# Patient Record
Sex: Male | Born: 1990 | Race: Black or African American | Hispanic: No | Marital: Single | Smoking: Current every day smoker
Health system: Southern US, Community
[De-identification: ages and names within clinical notes are randomized; demographics above are authoritative.]

## PROBLEM LIST (undated history)

## (undated) DIAGNOSIS — F329 Major depressive disorder, single episode, unspecified: Secondary | ICD-10-CM

## (undated) DIAGNOSIS — Z720 Tobacco use: Secondary | ICD-10-CM

## (undated) DIAGNOSIS — F1010 Alcohol abuse, uncomplicated: Secondary | ICD-10-CM

## (undated) DIAGNOSIS — L309 Dermatitis, unspecified: Secondary | ICD-10-CM

## (undated) DIAGNOSIS — F10939 Alcohol use, unspecified with withdrawal, unspecified: Secondary | ICD-10-CM

## (undated) DIAGNOSIS — F1020 Alcohol dependence, uncomplicated: Secondary | ICD-10-CM

## (undated) DIAGNOSIS — I5020 Unspecified systolic (congestive) heart failure: Secondary | ICD-10-CM

---
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
## 2011-05-01 ENCOUNTER — Emergency Department (HOSPITAL_COMMUNITY)
Admission: EM | Admit: 2011-05-01 | Discharge: 2011-05-01 | Disposition: A | Discharge status: Home / Self Care | Payer: PRIVATE HEALTH INSURANCE | Source: Home / Self Care | Attending: Family Medicine | Admitting: Family Medicine

## 2011-05-01 DIAGNOSIS — F419 Anxiety disorder, unspecified: Secondary | ICD-10-CM

## 2011-05-01 DIAGNOSIS — F411 Generalized anxiety disorder: Secondary | ICD-10-CM

## 2011-05-01 MED ORDER — LORAZEPAM 0.5 MG PO TABS: 0.5000 mg | ORAL_TABLET | Freq: Three times a day (TID) | ORAL | Status: AC | PRN

## 2011-05-01 NOTE — ED Notes (Signed)
C/o he is anxious all the time, since about 1 yr or so ago; NAD at presenr

## 2011-05-01 NOTE — ED Provider Notes (Signed)
History     CSN: [id number] Arrival date & time: 05/01/2011 11:14 AM   First MD Initiated Contact with Patient 05/01/11 1050      Chief Complaint  Patient presents with  . Anxiety    (Consider location/radiation/quality/duration/timing/severity/associated sxs/prior treatment) Patient is a 20 y.o. male presenting with anxiety. The history is provided by the patient.  Anxiety This is a chronic problem. Episode onset: ONSGOING FOR ABOUT A YEAR. The problem occurs constantly. The problem has not changed since onset.Pertinent negatives include no headaches. Associated symptoms comments: NONE.  STATES HE IS NOT SLEEPING, HAS LOST WEIGHT. STATES LIVING AND SCHOOL HAVE HIM STRESSED OUT. NO SUICIDAL OR HOMICIDAL IDEATION. STATES HE WANTS TO GO TO MENTAL HEALTH BUT DID NOT KNOW HOW TO GO ABOUT IT. HE IS HEAR WITH HIS FATHER. DENIES USE OF ENERGY DRINKS, CAFFEINE, OR NICOTINE.   History reviewed. No pertinent past medical history.  History reviewed. No pertinent past surgical history.  History reviewed. No pertinent family history.  History  Substance Use Topics  . Smoking status: Not on file  . Smokeless tobacco: Not on file  . Alcohol Use: Yes      Review of Systems  Constitutional: Positive for unexpected weight change.  HENT: Negative.   Eyes: Negative.   Respiratory: Negative.   Cardiovascular: Negative.   Gastrointestinal: Negative.   Genitourinary: Negative.   Musculoskeletal: Negative.   Neurological: Negative for headaches.  Psychiatric/Behavioral: Positive for sleep disturbance. The patient is nervous/anxious.     Allergies  Review of patient's allergies indicates no known allergies.  Home Medications   Current Outpatient Rx  Name Route Sig Dispense Refill  . LORAZEPAM 0.5 MG PO TABS Oral Take 1 tablet (0.5 mg total) by mouth every 8 (eight) hours as needed for anxiety. 15 tablet 0    BP 107/83  Pulse 75  Temp(Src) 98.1 F (36.7 C) (Oral)  Resp 16  SpO2  100%  Physical Exam  Constitutional: He is oriented to person, place, and time. He appears well-developed and well-nourished. No distress.  HENT:  Head: Normocephalic and atraumatic.  Right Ear: External ear normal.  Left Ear: External ear normal.  Nose: Nose normal.  Mouth/Throat: Oropharynx is clear and moist.  Eyes: Pupils are equal, round, and reactive to light.  Neck: Normal range of motion. Neck supple. No tracheal deviation present. No thyromegaly present.  Cardiovascular: Normal rate, regular rhythm and normal heart sounds.   Pulmonary/Chest: Effort normal and breath sounds normal.  Abdominal: Soft. Bowel sounds are normal.  Lymphadenopathy:    He has no cervical adenopathy.  Neurological: He is alert and oriented to person, place, and time. He has normal reflexes.  Skin: Skin is warm and dry.  Psychiatric: He has a normal mood and affect. His behavior is normal. Judgment and thought content normal.    ED Course  Procedures (including critical care time)  Labs Reviewed - No data to display No results found.   1. Anxiety       MDM          Randa Spike, MD 05/01/11 [id number]

## 2011-09-09 ENCOUNTER — Encounter (HOSPITAL_COMMUNITY): Payer: Self-pay | Admitting: *Deleted

## 2011-09-09 ENCOUNTER — Emergency Department (INDEPENDENT_AMBULATORY_CARE_PROVIDER_SITE_OTHER)
Admission: EM | Admit: 2011-09-09 | Discharge: 2011-09-09 | Disposition: A | Discharge status: Home / Self Care | Payer: PRIVATE HEALTH INSURANCE | Source: Home / Self Care | Attending: Emergency Medicine | Admitting: Emergency Medicine

## 2011-09-09 DIAGNOSIS — R51 Headache: Secondary | ICD-10-CM

## 2011-09-09 DIAGNOSIS — F438 Other reactions to severe stress: Secondary | ICD-10-CM

## 2011-09-09 HISTORY — DX: Dermatitis, unspecified: L30.9

## 2011-09-09 LAB — POCT URINALYSIS DIP (DEVICE)
Bilirubin Urine: NEGATIVE
Protein, ur: NEGATIVE mg/dL
pH: 6.5 (ref 5.0–8.0)

## 2011-09-09 NOTE — ED Notes (Signed)
Pt college student with c/o decreased appetite/weight loss/constipation x approx one year  Per pt has lost 15 lbs since fall of 2011 - start of college - pt had headache this morning which has resolved

## 2011-09-09 NOTE — ED Provider Notes (Signed)
Medical screening examination/treatment/procedure(s) were performed by non-physician practitioner and as supervising physician I was immediately available for consultation/collaboration.Have examined and interviewed patient with NP student.  Raynald Blend, MD 09/09/11 2142

## 2011-09-09 NOTE — Discharge Instructions (Signed)
   Has discuss have recommended that Bradley Cummings colds or behavioral clinic for a intake visit or interview to further evaluate his current symptoms. We cannot continue prescribing Adderall without a formal diagnosis. Please call this provider number to schedule a followup appointment should also call second provided number to established Bradley Cummings to a with a primary care Dr.    Denton Meek Headache, Without Cause A general headache has no specific cause. These headaches are not life-threatening. They will not lead to other types of headaches. HOME CARE   Make and keep follow-up visits with your doctor.   Only take medicine as told by your doctor.   Try to relax, get a massage, or use your thoughts to control your body (biofeedback).   Apply cold or heat to the head and neck. Apply 3 or 4 times a day or as needed.  Finding out the results of your test Ask when your test results will be ready. Make sure you get your test results. GET HELP RIGHT AWAY IF:   You have problems with medicine.   Your medicine does not help relieve pain.   Your headache changes or becomes worse.   You feel sick to your stomach (nauseous) or throw up (vomit).   You have a temperature by mouth above 102 F (38.9 C), not controlled by medicine.   Your have a stiff neck.   You have vision loss.   You have muscle weakness.   You lose control of your muscles.   You lose balance or have trouble walking.   You feel like you are going to pass out (faint).  MAKE SURE YOU:   Understand these instructions.   Will watch this condition.   Will get help right away if you are not doing well or get worse.  Document Released: 02/25/2008 Document Revised: 05/07/2011 Document Reviewed: 02/25/2008 [facility] Patient Information 2012 [place], [place].General Headache, Without Cause A general headache has no specific cause. These headaches are not life-threatening. They will not lead to other types of headaches. HOME CARE    Make and keep follow-up visits with your doctor.   Only take medicine as told by your doctor.   Try to relax, get a massage, or use your thoughts to control your body (biofeedback).   Apply cold or heat to the head and neck. Apply 3 or 4 times a day or as needed.  Finding out the results of your test Ask when your test results will be ready. Make sure you get your test results. GET HELP RIGHT AWAY IF:   You have problems with medicine.   Your medicine does not help relieve pain.   Your headache changes or becomes worse.   You feel sick to your stomach (nauseous) or throw up (vomit).   You have a temperature by mouth above 102 F (38.9 C), not controlled by medicine.   Your have a stiff neck.   You have vision loss.   You have muscle weakness.   You lose control of your muscles.   You lose balance or have trouble walking.   You feel like you are going to pass out (faint).  MAKE SURE YOU:   Understand these instructions.   Will watch this condition.   Will get help right away if you are not doing well or get worse.  Document Released: 02/25/2008 Document Revised: 05/07/2011 Document Reviewed: 02/25/2008 [facility] Patient Information 2012 [place], [place].

## 2011-09-09 NOTE — ED Provider Notes (Signed)
History     CSN: [id number]  Arrival date & time 09/09/11  4098   First MD Initiated Contact with Patient 09/09/11 1139      Chief Complaint  Patient presents with  . Constipation  . Headache    (Consider location/radiation/quality/duration/timing/severity/associated sxs/prior treatment) HPI Comments: Had a string headache yesterday, he has been having problems sleeping and eating since going to colleague. Not eating well and  loosing weight, not  Patient is a 21 y.o. male presenting with constipation and headaches. The history is provided by the patient.  Constipation  The problem occurs frequently. The problem has been resolved. The patient is experiencing no pain. Prior successful therapies include stool softeners and fiber. Associated symptoms include headaches. Pertinent negatives include no fever, no rectal pain, no vomiting and no rash.  Headache The primary symptoms include headaches. Primary symptoms do not include dizziness, fever or vomiting.  The headache is not associated with weakness.  Additional symptoms do not include weakness.    Past Medical History  Diagnosis Date  . Eczema     History reviewed. No pertinent past surgical history.  History reviewed. No pertinent family history.  History  Substance Use Topics  . Smoking status: Never Smoker   . Smokeless tobacco: Not on file  . Alcohol Use: Yes      Review of Systems  Constitutional: Positive for appetite change, fatigue and unexpected weight change. Negative for fever.  HENT: Negative for ear pain.   Gastrointestinal: Positive for constipation. Negative for vomiting and rectal pain.  Musculoskeletal: Negative.   Skin: Negative.  Negative for rash.  Neurological: Positive for headaches. Negative for dizziness, facial asymmetry, speech difficulty, weakness and numbness.    Allergies  Review of patient's allergies indicates no known allergies.  Home Medications  No current outpatient  prescriptions on file.  BP 115/79  Pulse 60  Temp(Src) 98.5 F (36.9 C) (Oral)  Resp 18  SpO2 100%  Physical Exam  Nursing note and vitals reviewed. Constitutional: He appears well-developed and well-nourished.  HENT:  Head: Normocephalic.  Right Ear: Tympanic membrane normal.  Left Ear: Tympanic membrane normal.  Mouth/Throat: Uvula is midline and oropharynx is clear and moist. No oropharyngeal exudate.  Eyes: Conjunctivae are normal.  Neck: Neck supple.  Cardiovascular: Normal rate.   Pulmonary/Chest: Effort normal and breath sounds normal. He has no decreased breath sounds.  Skin: No rash noted.    ED Course  Procedures (including critical care time)   Labs Reviewed  POCT URINALYSIS DIP (DEVICE)   No results found.   1. Generalized headaches   2. Other acute reactions to stress       MDM  Recurrent HAs patient feels he is stressed out at colleague, and not ding well, feels very anxious about it, wants to take some more adder al. Davina Poke parent  estblish hi with a primary care doctor and to a behavior health provider        Jimmie Molly, MD 09/09/11 2141

## 2011-09-09 NOTE — ED Provider Notes (Signed)
History     CSN: [id number]  Arrival date & time 09/09/11  4098   First MD Initiated Contact with Patient 09/09/11 1139      Chief Complaint  Patient presents with  . Constipation  . Headache    (Consider location/radiation/quality/duration/timing/severity/associated sxs/prior treatment) The history is provided by the patient and a parent. No language interpreter was used.    Past Medical History  Diagnosis Date  . Eczema     History reviewed. No pertinent past surgical history.  History reviewed. No pertinent family history.  History  Substance Use Topics  . Smoking status: Never Smoker   . Smokeless tobacco: Not on file  . Alcohol Use: Yes      Review of Systems  Constitutional: Positive for appetite change and unexpected weight change. Negative for fever, chills, diaphoresis, activity change and fatigue.       Reports appetite changes, "dont feel like eating"  Has lost approximately "15 lbs since 2011 when starting college"  Reports overall  "tightness feeling" to the body   Respiratory: Negative for apnea, cough, choking, shortness of breath and wheezing.   Cardiovascular: Negative for palpitations.  Gastrointestinal: Positive for constipation. Negative for nausea, vomiting, diarrhea, blood in stool and abdominal distention.       Abdominal "tightness", hx of constipation stools " once weekly"  Genitourinary: Negative for dysuria, hematuria, flank pain, discharge and difficulty urinating.       Reports hx of "pelvic pain /tightness"  Skin: Negative for rash.  Neurological: Negative for dizziness.  Psychiatric/Behavioral: Negative for confusion.       Reports he feels "stressed" since he has been "starting college in 2011"    Allergies  Review of patient's allergies indicates no known allergies.  Home Medications  No current outpatient prescriptions on file.  BP 115/79  Pulse 60  Temp(Src) 98.5 F (36.9 C) (Oral)  Resp 18  SpO2 100%  Physical Exam    Constitutional: He is oriented to person, place, and time. He appears well-developed and well-nourished. No distress.  Cardiovascular: Normal rate.   No murmur heard. Pulmonary/Chest: Effort normal. No respiratory distress. He has no wheezes.  Abdominal: He exhibits no distension. There is no tenderness. There is no rebound and no guarding.  Neurological: He is alert and oriented to person, place, and time.  Skin: Skin is warm and dry. No rash noted. No erythema.  Psychiatric:       Cooperative, report marijuana uses, etoh socially,  Denies other drug use, reports he had Adderall in December which seem to help him concentrate and help him think    ED Course  Procedures (including critical care time)   Labs Reviewed  POCT URINALYSIS DIP (DEVICE)   No results found.   No diagnosis found.    MDM  21 yo male presents to [facility] today afebrile with c/o loss of appetite, tightness feeling throughout the body, pelvic tightness, 15 lb weight loss since starting college in 2011, was given Adderall in December which helped him be able to think and helped, urinalysis wnl, explained that would send to behavior health for referral for further tx regimen, father and patient agree with tx plan        Lolly Mustache, Student-NP 09/09/11 1253

## 2011-10-27 ENCOUNTER — Ambulatory Visit (HOSPITAL_COMMUNITY): Admission: RE | Admit: 2011-10-27 | Payer: Self-pay | Source: Home / Self Care | Admitting: Psychiatry

## 2011-10-27 NOTE — BH Assessment (Signed)
Assessment Note   Bradley Cummings is an 21 y.o. male who presented to this facility as a walk-in accompanied by both his parents. Parents report that patient has been acting different, unable to eat and reporting hearing people who commend him to "do things I don't like to do". Patient denied SI/HI.  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 Parents report that patient was taken to [facility] urgent Care a couple of days ago, was put on a certain  medication which did not help. Yesterday patient and parents returned to urgent care and were recommended to come to [facility].  Patient came to [facility] voluntarily but was reluctant to be assessed. Assessment was started per parents encouragements. Pt reported that he has been smoking marijuana since 12th grade and has been drinking alcohol occasionally. Patient has not been able to eat for a couple of weeks but can sleep up to 8 hours. Patient has lost more than  5 lbs and appears weak and slim. During assessment, patient was restless, wanting to leave but parents encouraged him to stay. Patient decided to sign any consent and walked away in the middle of the assessment. AC was notified.   Axis I: Psychotic Disorder NOS Axis II: Deferred Axis III:  Past Medical History  Diagnosis Date  . Eczema    Axis IV: other psychosocial or environmental problems and problems related to social environment Axis V: 21-30 behavior considerably influenced by delusions or hallucinations OR serious impairment in judgment, communication OR inability to function in almost all areas  Past Medical History:  Past Medical History  Diagnosis Date  . Eczema     No past surgical history on file.  Family History: No family history on file.  Social History:  reports that he has never smoked. He does not have any smokeless tobacco history on file. He reports that he drinks alcohol. He reports that he does not use illicit drugs.  Additional Social History:    patient admits using marijuana on regular basis, one blunt daily. Last use 5/27. Admits drinking alcohol occasionally; has not had one in 2 weeks.   CIWA:   COWS:    Allergies: No Known Allergies  Home Medications:  No prescriptions prior to admission    OB/GYN Status:  No LMP for male patient.  General Assessment Data Location of Assessment: [facility] Assessment Services Living Arrangements: Parent Can pt return to current living arrangement?: Yes Admission Status: Voluntary Is patient capable of signing voluntary admission?: Yes Transfer from: Home Referral Source: Self/Family/Friend  Education Status Is patient currently in school?: Yes Current Grade: sophomore Highest grade of school patient has completed: junior college Name of school: [employer] person: [place], [place] [phone])  Risk to self Suicidal Ideation: No Suicidal Intent: No Is patient at risk for suicide?: No Suicidal Plan?: No Access to Means: No What has been your use of drugs/alcohol within the last 12 months?: using marijuana Previous Attempts/Gestures: No How many times?: 0  Other Self Harm Risks: na Triggers for Past Attempts: None known Intentional Self Injurious Behavior: None Family Suicide History: No Recent stressful life event(s): Other (Comment) (no one listed) Persecutory voices/beliefs?: Yes Depression: No Substance abuse history and/or treatment for substance abuse?: Yes (alcohol and marijuana) Suicide prevention information given to non-admitted patients: Not applicable  Risk to Others Homicidal Ideation: No Thoughts of Harm to Others: No Current Homicidal Intent: No Current Homicidal Plan: No Access to Homicidal Means: No Identified Victim: na History  of harm to others?: No Assessment of Violence: None Noted Violent Behavior Description: na Does patient have access to weapons?: No Criminal Charges Pending?: No Does patient have a court date:  No  Psychosis Hallucinations: Auditory ("hear people pushing me to do things i dont like to do") Delusions: None noted  Mental Status Report Appear/Hygiene: Other (Comment) (age apropriate) Eye Contact: Poor Motor Activity: Restlessness Speech: Incoherent;Pressured Level of Consciousness: Restless;Other (Comment) (weak/tired) Mood: Anxious;Fearful;Irritable;Sad Affect: Anxious;Sad Anxiety Level: Moderate Thought Processes: Tangential Judgement: Impaired Orientation: Person;Place;Situation Obsessive Compulsive Thoughts/Behaviors: Moderate  Cognitive Functioning Concentration: Decreased Memory: Remote Impaired IQ: Average Insight: Poor Impulse Control: Poor Appetite: Poor (dad reports pt has not been eating) Weight Loss: 5  Weight Gain: 0  Sleep: No Change Total Hours of Sleep: 8  Vegetative Symptoms: None  ADLScreening [facility] Assessment Services) Patient's cognitive ability adequate to safely complete daily activities?: No Patient able to express need for assistance with ADLs?: Yes Independently performs ADLs?: Yes  Abuse/Neglect [facility]) Physical Abuse: Denies Verbal Abuse: Denies Sexual Abuse: Denies  Prior Inpatient Therapy Prior Inpatient Therapy: No Prior Therapy Dates: na Prior Therapy Facilty/Provider(s): na Reason for Treatment: na  Prior Outpatient Therapy Prior Outpatient Therapy: No Prior Therapy Dates: na Prior Therapy Facilty/Provider(s): na Reason for Treatment: na  ADL Screening (condition at time of admission) Patient's cognitive ability adequate to safely complete daily activities?: No Patient able to express need for assistance with ADLs?: Yes Independently performs ADLs?: Yes       Abuse/Neglect Assessment (Assessment to be complete while patient is alone) Physical Abuse: Denies Verbal Abuse: Denies Sexual Abuse: Denies          Additional Information 1:1 In Past 12 Months?: No CIRT Risk: Yes Elopement Risk: Yes Does patient  have medical clearance?: No     Disposition:  Disposition Disposition of Patient: Other dispositions (refused. Left facility) Other disposition(s): Other (Comment) (pt left the unit refusing further assessment&services)  On Site Evaluation by:   Reviewed with Physician:     Olin Pia 10/27/2011 7:45 AM

## 2011-10-28 ENCOUNTER — Ambulatory Visit (HOSPITAL_COMMUNITY)
Admission: RE | Admit: 2011-10-28 | Discharge: 2011-10-28 | Disposition: A | Discharge status: Home / Self Care | Payer: PRIVATE HEALTH INSURANCE | Attending: Psychiatry | Admitting: Psychiatry

## 2011-10-28 DIAGNOSIS — F1010 Alcohol abuse, uncomplicated: Secondary | ICD-10-CM | POA: Insufficient documentation

## 2011-10-28 DIAGNOSIS — F411 Generalized anxiety disorder: Secondary | ICD-10-CM | POA: Insufficient documentation

## 2011-10-28 DIAGNOSIS — Z7289 Other problems related to lifestyle: Secondary | ICD-10-CM | POA: Insufficient documentation

## 2011-10-28 DIAGNOSIS — Z658 Other specified problems related to psychosocial circumstances: Secondary | ICD-10-CM | POA: Insufficient documentation
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
## 2015-12-29 ENCOUNTER — Ambulatory Visit (HOSPITAL_COMMUNITY)
Admission: EM | Admit: 2015-12-29 | Discharge: 2015-12-29 | Disposition: A | Discharge status: Home / Self Care | Payer: Managed Care, Other (non HMO) | Attending: Emergency Medicine | Admitting: Emergency Medicine

## 2015-12-29 ENCOUNTER — Encounter (HOSPITAL_COMMUNITY): Payer: Self-pay | Admitting: *Deleted

## 2015-12-29 DIAGNOSIS — F1010 Alcohol abuse, uncomplicated: Secondary | ICD-10-CM | POA: Diagnosis not present

## 2015-12-29 DIAGNOSIS — K2970 Gastritis, unspecified, without bleeding: Secondary | ICD-10-CM

## 2015-12-29 MED ORDER — PANTOPRAZOLE SODIUM 40 MG PO TBEC: 40.0000 mg | DELAYED_RELEASE_TABLET | Freq: Every day | ORAL | 1 refills | Status: DC

## 2015-12-29 MED ORDER — SUCRALFATE 1 GM/10ML PO SUSP: 1.0000 g | Freq: Three times a day (TID) | ORAL | 1 refills | Status: DC

## 2015-12-29 NOTE — Discharge Instructions (Signed)
Your stomach pain is coming from gastritis. This is caused by the alcohol you drink. Take Protonix daily. Use the Carafate 4 times a day. You need to stop drinking.  If you continue to drink alcohol, your stomach is never going to heal and you will develop more serious health problems. Please look into the resources I have given you for help with quitting alcohol. If you develop vomiting, particularly with blood in it, worsening shakes, or start seeing things that aren't there, please go to the emergency room.

## 2015-12-29 NOTE — ED Triage Notes (Signed)
Patient comes in today complaining of lower abdominal pain, states he just haven't felt like eating this week, states he feels bloated, denies any n/v/d. On exam patient shaking with almost anxiety type behavior, upon further questioning patient drinks 3-4 40oz beers a day for the last 2 years. Patient states he drinks to have a good time but also states that he drinks to stay calm. Patient denies hearing voices or thoughts of hurting himself. Patient also reports that he will have burning sensations over his entire body, hard to get patient to explain this further.

## 2015-12-29 NOTE — ED Provider Notes (Signed)
[facility]    CSN: [id number] Arrival date & time: 12/29/15  1954  First Provider Contact:  First MD Initiated Contact with Patient 12/29/15 2109        History   Chief Complaint Chief Complaint  Patient presents with  . Abdominal Pain  . Shaking  . Anxiety    HPI Bradley Cummings is a 25 y.o. male.   He is a 25 year old man here with his father for evaluation of abdominal pain. He reports over the last month he has had abdominal pain and bloating. Sometimes this is associated with nausea and vomiting. Occasionally he will see blood in the vomit. He has not had any vomiting for the last week or 2. He denies any diarrhea. He states he has bowel movements about once a week. He denies any straining or hard stools.  He drinks 3 40s a day. Last alcohol was 24 hours ago. He is shaky.      Past Medical History:  Diagnosis Date  . Eczema     There are no active problems to display for this patient.   History reviewed. No pertinent surgical history.     Home Medications    Prior to Admission medications   Medication Sig Start Date End Date Taking? Authorizing Provider  pantoprazole (PROTONIX) 40 MG tablet Take 1 tablet (40 mg total) by mouth daily. 12/29/15   Charm Rings, MD  sucralfate (CARAFATE) 1 GM/10ML suspension Take 10 mLs (1 g total) by mouth 4 (four) times daily -  with meals and at bedtime. 12/29/15   Charm Rings, MD    Family History History reviewed. No pertinent family history.  Social History Social History  Substance Use Topics  . Smoking status: Current Every Day Smoker  . Smokeless tobacco: Never Used  . Alcohol use Yes     Comment: 3-4 40oz beers a day     Allergies   Review of patient's allergies indicates no known allergies.   Review of Systems Review of Systems  Constitutional: Negative for fever.  Gastrointestinal: Positive for abdominal distention, abdominal pain, nausea and vomiting. Negative for constipation and diarrhea.      Physical Exam Triage Vital Signs ED Triage Vitals  Enc Vitals Group     BP 12/29/15 2106 142/79     Pulse Rate 12/29/15 2106 100     Resp 12/29/15 2106 19     Temp 12/29/15 2106 98 F (36.7 C)     Temp Source 12/29/15 2106 Oral     SpO2 12/29/15 2106 100 %     Weight --      Height --      Head Circumference --      Peak Flow --      Pain Score 12/29/15 2110 5     Pain Loc --      Pain Edu? --      Excl. in GC? --    No data found.   Updated Vital Signs BP 142/79 (BP Location: Left Arm)   Pulse 100   Temp 98 F (36.7 C) (Oral)   Resp 19   SpO2 100%   Visual Acuity Right Eye Distance:   Left Eye Distance:   Bilateral Distance:    Right Eye Near:   Left Eye Near:    Bilateral Near:     Physical Exam  Constitutional: He appears well-developed and well-nourished. No distress.  Cardiovascular: Normal rate, regular rhythm and normal heart sounds.   No murmur  heard. Pulmonary/Chest: Effort normal and breath sounds normal. He has no wheezes. He has no rales.  Abdominal: Soft. He exhibits no distension. There is tenderness (primarily in epigastric). There is no rebound and no guarding.  Increased bowel sounds     UC Treatments / Results  Labs (all labs ordered are listed, but only abnormal results are displayed) Labs Reviewed - No data to display  EKG  EKG Interpretation None       Radiology No results found.  Procedures Procedures (including critical care time)  Medications Ordered in UC Medications - No data to display   Initial Impression / Assessment and Plan / UC Course  I have reviewed the triage vital signs and the nursing notes.  Pertinent labs & imaging results that were available during my care of the patient were reviewed by me and considered in my medical decision making (see chart for details).  Clinical Course    Pain is likely coming from alcoholic gastritis. Prescription provided for Protonix and Carafate. He is somewhat  shaky and is likely going through alcohol withdrawal. He states he has been through withdrawal before. Denies any history of delirium tremens. We had an extensive discussion about the amount of alcohol he is drinking. Discussed that the medications I'm giving him will help with his pain, but if he continues to drink the stomach will never heal and he will develop more serious health problems.  Provided the resource guide for outpatient substance abuse. His father, who is present for this visit, will work with him to find help for substance abuse. Reviewed reasons to go to the emergency room.  Final Clinical Impressions(s) / UC Diagnoses   Final diagnoses:  Gastritis  Alcohol abuse    New Prescriptions Discharge Medication List as of 12/29/2015  9:27 PM    START taking these medications   Details  pantoprazole (PROTONIX) 40 MG tablet Take 1 tablet (40 mg total) by mouth daily., Starting Sun 12/29/2015, Print    sucralfate (CARAFATE) 1 GM/10ML suspension Take 10 mLs (1 g total) by mouth 4 (four) times daily -  with meals and at bedtime., Starting Sun 12/29/2015, Print         Charm Rings, MD 12/29/15 2134

## 2016-04-02 ENCOUNTER — Ambulatory Visit (HOSPITAL_COMMUNITY)
Admission: RE | Admit: 2016-04-02 | Discharge: 2016-04-02 | Disposition: A | Discharge status: Home / Self Care | Payer: 59 | Source: Home / Self Care | Attending: Psychiatry | Admitting: Psychiatry

## 2016-04-02 ENCOUNTER — Encounter (HOSPITAL_COMMUNITY): Payer: Self-pay | Admitting: Emergency Medicine

## 2016-04-02 ENCOUNTER — Emergency Department (HOSPITAL_COMMUNITY)
Admission: EM | Admit: 2016-04-02 | Discharge: 2016-04-03 | Disposition: A | Discharge status: Other Acute Inpatient Hospital | Payer: Managed Care, Other (non HMO) | Attending: Emergency Medicine | Admitting: Emergency Medicine

## 2016-04-02 DIAGNOSIS — F329 Major depressive disorder, single episode, unspecified: Secondary | ICD-10-CM | POA: Diagnosis present

## 2016-04-02 DIAGNOSIS — F17210 Nicotine dependence, cigarettes, uncomplicated: Secondary | ICD-10-CM | POA: Insufficient documentation

## 2016-04-02 DIAGNOSIS — Z79899 Other long term (current) drug therapy: Secondary | ICD-10-CM | POA: Diagnosis not present

## 2016-04-02 DIAGNOSIS — R441 Visual hallucinations: Secondary | ICD-10-CM

## 2016-04-02 DIAGNOSIS — F1010 Alcohol abuse, uncomplicated: Secondary | ICD-10-CM | POA: Diagnosis not present

## 2016-04-02 LAB — CBC WITH DIFFERENTIAL/PLATELET
BASOS ABS: 0 10*3/uL (ref 0.0–0.1)
Basophils Relative: 0 %
EOS ABS: 0.2 10*3/uL (ref 0.0–0.7)
EOS PCT: 2 %
HCT: 38.7 % — ABNORMAL LOW (ref 39.0–52.0)
Hemoglobin: 12.8 g/dL — ABNORMAL LOW (ref 13.0–17.0)
Lymphocytes Relative: 33 %
Lymphs Abs: 2.3 10*3/uL (ref 0.7–4.0)
MCH: 28.8 pg (ref 26.0–34.0)
MCHC: 33.1 g/dL (ref 30.0–36.0)
MCV: 87.2 fL (ref 78.0–100.0)
Monocytes Absolute: 0.6 10*3/uL (ref 0.1–1.0)
Monocytes Relative: 9 %
Neutro Abs: 4 10*3/uL (ref 1.7–7.7)
Neutrophils Relative %: 56 %
PLATELETS: 236 10*3/uL (ref 150–400)
RBC: 4.44 MIL/uL (ref 4.22–5.81)
RDW: 13 % (ref 11.5–15.5)
WBC: 7.1 10*3/uL (ref 4.0–10.5)

## 2016-04-02 LAB — COMPREHENSIVE METABOLIC PANEL
ALBUMIN: 4.5 g/dL (ref 3.5–5.0)
ALT: 11 U/L — ABNORMAL LOW (ref 17–63)
ANION GAP: 8 (ref 5–15)
AST: 15 U/L (ref 15–41)
Alkaline Phosphatase: 42 U/L (ref 38–126)
BILIRUBIN TOTAL: 0.6 mg/dL (ref 0.3–1.2)
BUN: 11 mg/dL (ref 6–20)
CALCIUM: 9.4 mg/dL (ref 8.9–10.3)
CHLORIDE: 104 mmol/L (ref 101–111)
CO2: 29 mmol/L (ref 22–32)
CREATININE: 0.71 mg/dL (ref 0.61–1.24)
GFR calc Af Amer: >60 mL/min (ref >60)
GFR calc non Af Amer: >60 mL/min (ref >60)
GLUCOSE: 98 mg/dL (ref 65–99)
POTASSIUM: 4 mmol/L (ref 3.5–5.1)
SODIUM: 141 mmol/L (ref 135–145)
Total Protein: 7.4 g/dL (ref 6.5–8.1)

## 2016-04-02 LAB — RAPID URINE DRUG SCREEN, HOSP PERFORMED
Amphetamines: NOT DETECTED
BARBITURATES: NOT DETECTED
Benzodiazepines: NOT DETECTED
COCAINE: NOT DETECTED
OPIATES: NOT DETECTED
Tetrahydrocannabinol: NOT DETECTED

## 2016-04-02 LAB — URINALYSIS, ROUTINE W REFLEX MICROSCOPIC
Bilirubin Urine: NEGATIVE
GLUCOSE, UA: NEGATIVE mg/dL
Hgb urine dipstick: NEGATIVE
Ketones, ur: NEGATIVE mg/dL
LEUKOCYTES UA: NEGATIVE
Nitrite: NEGATIVE
PROTEIN: NEGATIVE mg/dL
Specific Gravity, Urine: 1.028 (ref 1.005–1.030)
pH: 7.5 (ref 5.0–8.0)

## 2016-04-02 LAB — ACETAMINOPHEN LEVEL: Acetaminophen (Tylenol), Serum: <10 ug/mL — ABNORMAL LOW (ref 10–30)

## 2016-04-02 LAB — ETHANOL

## 2016-04-02 LAB — SALICYLATE LEVEL

## 2016-04-02 NOTE — ED Notes (Signed)
Requested pt remove clothing and change into burgandy scrubs...scrubs and yellow hospital socks provided. Pt father at bedside. Pt hesitant to change into scrubs; assured pt personal clothing/belongings will be placed in belongings bag and put in secure location until discharge. Bradley BurtonEmily RN advised. ENM

## 2016-04-02 NOTE — ED Triage Notes (Signed)
25 year old male who now lives at home with his mother and father who stopped drinking 2 months ago.  He reports going through some mild withdrawals but was seen at an urgent care center for a stomach problem and is still taking an unknown medication.  He went tonight to behavioral health and they sent him here to be medically cleared.  Father thinks patient may be depressed or have anxiety.

## 2016-04-02 NOTE — BH Assessment (Signed)
Per Hassie BruceAC Kim, RN [facility]. Irving BurtonEmily, RN has been notified. Support paperwork to be completed by TTS .   Princess BruinsAquicha Duff, MSW, Theresia MajorsLCSWA

## 2016-04-02 NOTE — BH Assessment (Addendum)
Tele Assessment Note   Bradley Cummings is an 25 y.o. male who presents voluntarily accompanied by his father reporting symptoms of anxiety and paranoia. Pt has a history of substance abuse--alcohol (used to drink 1 case/day) and marijuana. Pt reports compliance with stomach medication, but no psychiatric meds.  Pt states that his last drink was 1 week ago, but his dad says that he thought he quit 2 months ago.  Pt admits to seeing things like shadows and dad says he talks to himself and seems confused and unable to focus for the past two months since he quit drinking. Pt admits to withdrawing from his friends and not enjoying playing basketball, etc like he used to.  Pt denies SI HI, or any past attempts. He has had no IP or Op treatment, history of violence.  Pt lives with his parents, and supports include his family. Pt denies history of abuse and trauma. Pt reports there is no family history of mental health px. Pt is not working.  Pt has poor insight and  judgment. Pt's memory is normal. Pt denies legal history. ? MSE: Pt is casually dressed, alert, oriented x4 with pressured speech and restless motor behavior. Eye contact is poor.  Pt's mood anxious and affect is congruent with mood. Thought process is coherent and relevant. There is indication pt could currently be responding to internal stimuli (laughing, smiling to himself), possible delusional thought content. Pt was cooperative throughout assessment.   De BurrsLaurie Park, NP recommends IP treatment. [facility] and TTS will seek inpatient psychiatric treatment.    Diagnosis: Psychotic disorder NOS  Past Medical History:  Past Medical History:  Diagnosis Date  . Eczema     No past surgical history on file.  Family History: No family history on file.  Social History:  reports that he has been smoking.  He has never used smokeless tobacco. He reports that he drinks alcohol. He reports that he does  not use drugs.  Additional Social History:  Alcohol / Drug Use Pain Medications: denies Prescriptions: denies Over the Counter: denies History of alcohol / drug use?: Yes Longest period of sobriety (when/how long): unk Negative Consequences of Use: Personal relationships Withdrawal Symptoms:  (denies currently) Substance #1 Name of Substance 1: alcohol 1 - Age of First Use: 20 1 - Amount (size/oz): used to drink a case a day 1 - Frequency: trying to stop 1 - Duration: 1 week 1 - Last Use / Amount: 1 week ago  CIWA:   COWS:    PATIENT STRENGTHS: (choose at least two) Communication skills Supportive family/friends  Allergies: No Known Allergies  Home Medications:  (Not in a hospital admission)  OB/GYN Status:  No LMP for male patient.  General Assessment Data [facility] T[facility] Assessment: In system Is this a Tele or Face-to-Face Assessment?: Face-to-Face Is this an Initial Assessment or a Re-assessment for this encounter?: Initial Assessment Marital status: Single Living Arrangements: Parent Can pt return to current living arrangement?: Yes Admission Status: Voluntary Is patient capable of signing voluntary admission?: Yes Referral Source: Self/Family/Friend Insurance type: [employer]  Medical Screening Exam [facility] ONLY) Medical Exam completed: Yes  Crisis Care Plan Living Arrangements: Parent Name of Psychiatrist:  (none) Name of Therapist: none  Education Status Is patient currently in school?: No  Risk to self with the past 6 months Suicidal Ideation: No Has patient been a risk to self within the past 6 months  prior to admission? : No Suicidal Intent: No Has patient had any suicidal intent within the past 6 months prior to admission? : No Is patient at risk for suicide?: No Suicidal Plan?: No Has patient had any suicidal plan within the past 6 months prior to admission? : No Access to Means: No What has been your  use of drugs/alcohol within the last 12 months?: denies Previous Attempts/Gestures: No Other Self Harm Risks:  (psychosis) Family Suicide History: No Recent stressful life event(s):  (stopped drinking) Persecutory voices/beliefs?: Yes Depression: Yes Depression Symptoms: Isolating, Loss of interest in usual pleasures, Feeling angry/irritable Substance abuse history and/or treatment for substance abuse?: Yes Suicide prevention information given to non-admitted patients: Not applicable  Risk to Others within the past 6 months Homicidal Ideation: No Does patient have any lifetime risk of violence toward others beyond the six months prior to admission? : No Thoughts of Harm to Others: No Current Homicidal Intent: No Current Homicidal Plan: No Access to Homicidal Means: No History of harm to others?: No Assessment of Violence: None Noted Does patient have access to weapons?: No Criminal Charges Pending?: No Does patient have a court date: No Is patient on probation?: No  Psychosis Hallucinations: Visual, Auditory Delusions: None noted  Mental Status Report Appearance/Hygiene: Unremarkable Eye Contact: Poor Motor Activity: Restlessness Speech: Pressured Level of Consciousness: Alert Mood: Anxious Affect: Anxious, Labile Anxiety Level: Severe Thought Processes: Thought Blocking Judgement: Partial Orientation: Person, Place, Time, Situation, Appropriate for developmental age Obsessive Compulsive Thoughts/Behaviors: None  Cognitive Functioning Concentration: Decreased Memory: Recent Intact, Remote Intact IQ: Average Insight: Poor Impulse Control: Fair Appetite: Fair Weight Loss: 0 Weight Gain: 0 Sleep: No Change Total Hours of Sleep: 8  ADLScreening [facility] Services) Patient's cognitive ability adequate to safely complete daily activities?: Yes Patient able to express need for assistance with ADLs?: Yes Independently performs ADLs?: Yes (appropriate for  developmental age)  Prior Inpatient Therapy Prior Inpatient Therapy: No  Prior Outpatient Therapy Prior Outpatient Therapy: No Does patient have an ACCT team?: No Does patient have Intensive In-House Services?  : No Does patient have [facility] services? : No Does patient have [facility] services?: No  ADL Screening (condition at time of admission) Patient's cognitive ability adequate to safely complete daily activities?: Yes Is the patient deaf or have difficulty hearing?: No Does the patient have difficulty seeing, even when wearing glasses/contacts?: No Does the patient have difficulty concentrating, remembering, or making decisions?: Yes Patient able to express need for assistance with ADLs?: Yes Does the patient have difficulty dressing or bathing?: No Independently performs ADLs?: Yes (appropriate for developmental age) Does the patient have difficulty walking or climbing stairs?: No Weakness of Legs: None Weakness of Arms/Hands: None  Home Assistive Devices/Equipment Home Assistive Devices/Equipment: None    Abuse/Neglect Assessment (Assessment to be complete while patient is alone) Physical Abuse: Denies Verbal Abuse: Denies Sexual Abuse: Denies Exploitation of patient/patient's resources: Denies Self-Neglect: Denies Values / Beliefs Cultural Requests During Hospitalization: None Spiritual Requests During Hospitalization: None   Advance Directives (For Healthcare) Does patient have an advance directive?: No Would patient like information on creating an advanced directive?: No - patient declined information    Additional Information 1:1 In Past 12 Months?: No CIRT Risk: No Elopement Risk: Yes Does patient have medical clearance?: No     Disposition:  Disposition Initial Assessment Completed for this Encounter: Yes Disposition of Patient: Inpatient treatment program  [facility],Laconya Clere Hines 04/02/2016 5:52 PM

## 2016-04-02 NOTE — H&P (Signed)
Behavioral Health Medical Screening Exam  Bradley Cummings is an 25 y.o. male.  Total Time spent with patient: 20 minutes  Psychiatric Specialty Exam: Physical Exam  Constitutional: He is oriented to person, place, and time. He appears well-developed and well-nourished.  HENT:  Head: Normocephalic.  Eyes: Pupils are equal, round, and reactive to light.  Neck: Normal range of motion.  Cardiovascular: Normal rate, regular rhythm and normal heart sounds.   Respiratory: Effort normal and breath sounds normal.  GI: Soft. Bowel sounds are normal.  Musculoskeletal: Normal range of motion.  Neurological: He is alert and oriented to person, place, and time.  Skin: Skin is warm and dry.    Review of Systems  Psychiatric/Behavioral: Positive for hallucinations. Negative for depression, memory loss and suicidal ideas. The patient is not nervous/anxious and does not have insomnia.   All other systems reviewed and are negative.     General Appearance: Casual and Well Groomed  Eye Contact:  Minimal  Speech:  Clear and Coherent and Pressured  Volume:  Normal  Mood:  Dysphoric  Affect:  Constricted and Flat  Thought Process:  Disorganized  Orientation:  Other:  unable to assess, pt is responding to internal stimuli  Thought Content:  Hallucinations: internal stimuli  Suicidal Thoughts:  No  Homicidal Thoughts:  No  Memory:  Immediate;   UTA, internal stimuli Recent;   uta internal stimuli Remote;   uta internal stimuli  Judgement:  Other:  internal stimuli, mostly non verbal  Insight:  internal stimuli  Psychomotor Activity:  Normal  Concentration: Concentration: Fair  Recall:  internal stimuli  Fund of Knowledge:Fair  Language: Fair  Akathisia:  No  Handed:  Right  AIMS (if indicated):     Assets:  Desire for Improvement Financial Resources/Insurance Housing Physical Health Resilience Social Support Transportation  Sleep:       Musculoskeletal: Strength & Muscle Tone: within  normal limits Gait & Station: normal Patient leans: N/A  BP 105/72   Pulse 73   Temp 98.2 F (36.8 C) (Oral)   Resp 18   SpO2 100%   Recommendations:  Based on my evaluation the patient appears to have an emergency medical condition for which I recommend the patient be transferred to the emergency department for further evaluation.  Laveda AbbeLaurie Britton Parks, NP 04/02/2016, 6:08 PM

## 2016-04-02 NOTE — ED Provider Notes (Signed)
[facility]-EMERGENCY DEPT Provider Note   CSN: [id number] Arrival date & time: 04/02/16  1829     History   Chief Complaint Chief Complaint  Patient presents with  . Anxiety    HPI Bradley Cummings is a 25 y.o. male.  25yo M who presents with abnormal behavior. Pt accompanied by father. He was evaluated at behavioral health today due to concerns from father that the patient may be depressed or anxious. Patient was previously a heavy alcohol drinker and his last drink was one week ago. He also reports history of marijuana use. At behavioral health, the patient endorsed hallucinations including "seeing shadows." He denied any SI or HI. [facility]. The patient denies any complaints or recent illness.   The history is provided by the patient and a parent.    Past Medical History:  Diagnosis Date  . Eczema     There are no active problems to display for this patient.   History reviewed. No pertinent surgical history.     Home Medications    Prior to Admission medications   Medication Sig Start Date End Date Taking? Authorizing Provider  sucralfate (CARAFATE) 1 GM/10ML suspension Take 10 mLs (1 g total) by mouth 4 (four) times daily -  with meals and at bedtime. 12/29/15  Yes Charm RingsErin J Honig, MD    Family History No family history on file.  Social History Social History  Substance Use Topics  . Smoking status: Current Every Day Smoker  . Smokeless tobacco: Never Used  . Alcohol use Yes     Comment: 3-4 40oz beers a day     Allergies   Review of patient's allergies indicates no known allergies.   Review of Systems Review of Systems 10 Systems reviewed and are negative for acute change except as noted in the HPI.   Physical Exam Updated Vital Signs BP (!) 120/52 (BP Location: Left Arm)   Pulse 64   Temp 98 F (36.7 C) (Oral)   Resp 16   SpO2 100%   Physical Exam  Constitutional: He is oriented to person, place, and time. He  appears well-developed and well-nourished. No distress.  HENT:  Head: Normocephalic and atraumatic.  Eyes: Conjunctivae are normal.  Neck: Neck supple.  Cardiovascular: Normal rate, regular rhythm and normal heart sounds.   No murmur heard. Pulmonary/Chest: Effort normal and breath sounds normal.  Neurological: He is alert and oriented to person, place, and time.  No tremors  Skin: Skin is warm and dry.  Psychiatric:  Avoids eye contact, pressured and racing speech  Nursing note and vitals reviewed.    ED Treatments / Results  Labs (all labs ordered are listed, but only abnormal results are displayed) Labs Reviewed  COMPREHENSIVE METABOLIC PANEL - Abnormal; Notable for the following:       Result Value   ALT 11 (*)    All other components within normal limits  CBC WITH DIFFERENTIAL/PLATELET - Abnormal; Notable for the following:    Hemoglobin 12.8 (*)    HCT 38.7 (*)    All other components within normal limits  ACETAMINOPHEN LEVEL - Abnormal; Notable for the following:    Acetaminophen (Tylenol), Serum <10 (*)    All other components within normal limits  ETHANOL  SALICYLATE LEVEL  RAPID URINE DRUG SCREEN, HOSP PERFORMED  URINALYSIS, ROUTINE W REFLEX MICROSCOPIC (NOT AT [facility])  PREGNANCY, URINE    EKG  EKG Interpretation None  Radiology No results found.  Procedures Procedures (including critical care time)  Medications Ordered in ED Medications - No data to display   Initial Impression / Assessment and Plan / ED Course  I have reviewed the triage vital signs and the nursing notes.  Pertinent labs  that were available during my care of the patient were reviewed by me and considered in my medical decision making (see chart for details).  Clinical Course   [facility] evaluation for hallucinations and concerns from parents. He was well-appearing on exam with reassuring vital signs. No tachycardia or tremulousness and no  altered mentation to suggest acute alcohol withdrawal.  Screening lab work is unremarkable. Patient is medically clear for psychiatric team evaluation and placement.  Final Clinical Impressions(s) / ED Diagnoses   Final diagnoses:  None    New Prescriptions New Prescriptions   No medications on file     Laurence Spatesachel Morgan Marcia Hartwell, MD 04/02/16 2044

## 2016-04-02 NOTE — Progress Notes (Signed)
Patient listed as having [place] without a pcp.  EDCM spoke to patient at bedside.  Patient confirms he does not have a pcp.  EDCM placed list of providers who accept [employer] insurance in belongings bag in locker number 32.  Patient did not make eye contact with this EDCM. [facility]. Patient brought in by father, reporting symptoms of anxiety and paranoia, per chart review.  Patient with history of substance abuse.  No history of IP or OP treatment per chart review.  No further EDCM needs at this time.

## 2016-04-02 NOTE — ED Notes (Addendum)
Pt. In burgundy scrubs.pt and belongings searched and wanded by security. Pt. Has 1 belongings bag. Pt. Has beige snicker, 1 orange/yellow shirt, 1 jean pant, 1 black belt, 1 passport, no cell phone, no money and no wallet. Pt. Belongings locked up in [facility] locker #32

## 2016-04-02 NOTE — ED Notes (Signed)
Sitter at bedside.

## 2016-04-03 ENCOUNTER — Inpatient Hospital Stay (HOSPITAL_COMMUNITY)
Admission: EM | Admit: 2016-04-03 | Discharge: 2016-04-09 | DRG: 885 | Disposition: A | Discharge status: Home / Self Care | Payer: 59 | Source: Intra-hospital | Attending: Psychiatry | Admitting: Psychiatry

## 2016-04-03 ENCOUNTER — Encounter (HOSPITAL_COMMUNITY): Payer: Self-pay

## 2016-04-03 DIAGNOSIS — R45851 Suicidal ideations: Secondary | ICD-10-CM | POA: Diagnosis present

## 2016-04-03 DIAGNOSIS — F1020 Alcohol dependence, uncomplicated: Secondary | ICD-10-CM | POA: Diagnosis present

## 2016-04-03 DIAGNOSIS — F323 Major depressive disorder, single episode, severe with psychotic features: Secondary | ICD-10-CM | POA: Diagnosis present

## 2016-04-03 DIAGNOSIS — F17210 Nicotine dependence, cigarettes, uncomplicated: Secondary | ICD-10-CM | POA: Diagnosis present

## 2016-04-03 DIAGNOSIS — F1210 Cannabis abuse, uncomplicated: Secondary | ICD-10-CM | POA: Clinically undetermined

## 2016-04-03 DIAGNOSIS — Z79899 Other long term (current) drug therapy: Secondary | ICD-10-CM

## 2016-04-03 DIAGNOSIS — F29 Unspecified psychosis not due to a substance or known physiological condition: Secondary | ICD-10-CM | POA: Diagnosis present

## 2016-04-03 LAB — LIPID PANEL
CHOL/HDL RATIO: 3.3 ratio
Cholesterol: 151 mg/dL (ref 0–200)
HDL: 46 mg/dL (ref >40)
LDL Cholesterol: 91 mg/dL (ref 0–99)
Triglycerides: 72 mg/dL (ref <150)
VLDL: 14 mg/dL (ref 0–40)

## 2016-04-03 LAB — TSH: TSH: 1.226 u[IU]/mL (ref 0.350–4.500)

## 2016-04-03 MED ORDER — OLANZAPINE 10 MG IM SOLR: 5.0000 mg | Freq: Three times a day (TID) | INTRAMUSCULAR | Status: DC | PRN

## 2016-04-03 MED ORDER — TRAZODONE HCL 50 MG PO TABS
50.0000 mg | ORAL_TABLET | Freq: Every evening | ORAL | Status: DC | PRN
  Filled 2016-04-03 (×2): qty 1

## 2016-04-03 MED ORDER — OLANZAPINE 5 MG PO TABS: 5.0000 mg | ORAL_TABLET | Freq: Three times a day (TID) | ORAL | Status: DC | PRN

## 2016-04-03 MED ORDER — ALUM & MAG HYDROXIDE-SIMETH 200-200-20 MG/5ML PO SUSP: 30.0000 mL | ORAL | Status: DC | PRN

## 2016-04-03 MED ORDER — SERTRALINE HCL 25 MG PO TABS
25.0000 mg | ORAL_TABLET | Freq: Every day | ORAL | Status: DC
  Administered 2016-04-03 – 2016-04-05 (×3): 25 mg via ORAL
  Filled 2016-04-03 (×6): qty 1

## 2016-04-03 MED ORDER — HYDROXYZINE HCL 25 MG PO TABS: 25.0000 mg | ORAL_TABLET | Freq: Three times a day (TID) | ORAL | Status: DC | PRN

## 2016-04-03 MED ORDER — TRAZODONE HCL 50 MG PO TABS
50.0000 mg | ORAL_TABLET | Freq: Every day | ORAL | Status: DC
  Administered 2016-04-03 – 2016-04-08 (×6): 50 mg via ORAL
  Filled 2016-04-03 (×9): qty 1

## 2016-04-03 MED ORDER — ARIPIPRAZOLE 5 MG PO TABS
5.0000 mg | ORAL_TABLET | Freq: Every day | ORAL | Status: DC
  Administered 2016-04-03: 5 mg via ORAL
  Filled 2016-04-03 (×4): qty 1

## 2016-04-03 MED ORDER — ACETAMINOPHEN 325 MG PO TABS: 650.0000 mg | ORAL_TABLET | Freq: Four times a day (QID) | ORAL | Status: DC | PRN

## 2016-04-03 MED ORDER — MAGNESIUM HYDROXIDE 400 MG/5ML PO SUSP: 30.0000 mL | Freq: Every day | ORAL | Status: DC | PRN
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
## 2016-04-03 NOTE — Progress Notes (Signed)
Admission Note:  D:24 yr male who presents [facility] in no acute distress for the treatment of Psychosis, paranoia , and anxiety. Pt appears flat and depressed. Pt was  cooperative with admission process. Pt presents with pressured speech. Pt denies SI/HI/AVH currently, but pt stated he was actively Hallucinating earlier. Pt stated he has been experiencing these Sx for one month. Pt appears very paranoid during the admission , constantly looking around the room and behind him as if he were expecting someone to come in the room. Pt gave vague general answers to questions this evening.    A:Skin was assessed and found to be clear of any abnormal marks apart from eczema on chest and neck . PT searched and no contraband found, POC and unit policies explained and understanding verbalized. Consents obtained. Food and fluids offered, and fluids accepted.  R: Pt had no additional questions or concerns.

## 2016-04-03 NOTE — H&P (Signed)
Psychiatric Admission Assessment Adult  Patient Identification: Bradley Cummings MRN:  [id number] Date of Evaluation:  04/03/2016 Chief Complaint:Patient states " I hear things , I see things , I cannot sit still , I am depressed, anxious.'   Principal Diagnosis: MDD (major depressive disorder), single episode, severe with psychosis (HCC) Diagnosis:   Patient Active Problem List   Diagnosis Date Noted  . MDD (major depressive disorder), single episode, severe with psychosis (HCC) [F32.3] 04/03/2016  . Alcohol use disorder, moderate, dependence (HCC) [F10.20] 04/03/2016  . Cannabis use disorder, mild, abuse [F12.10] 04/03/2016   History of Present Illness: Bradley Cummings is a 25 y.o.AA male , who is single, unemployed , dropped off from [facility] , but is thinking about re enrolling in [employer] , has a hx of anxiety,depression , who presented  voluntarily accompanied by his father to [facility]  reporting symptoms of anxiety and paranoia.    Per initial notes in EHR: " Pt has a history of substance abuse--alcohol (used to drink 1 case/day) and marijuana. Pt reports compliance with stomach medication, but no psychiatric meds.  Pt states that his last drink was 1 week ago, but his dad says that he thought he quit 2 months ago.  Pt admits to seeing things like shadows and dad says he talks to himself and seems confused and unable to focus for the past two months since he quit drinking. Pt admits to withdrawing from his friends and not enjoying playing basketball, etc like he used to.Pt denies SI HI, or any past attempts."  Patient seen and chart reviewed TODAY .Discussed patient with treatment team. Patient today is seen as withdrawn , has poor eye contact , appears paranoid , depressed. Pt seems to be minimizing his sx - states " I am bot depressed , bit anxious." Pt describes his anxiety as needing to pace all the time , inability to sit still to watch TV. Pt also reports chest pain and extreme anxiety at times -  reports he takes an aleve and he feels better then. Pt when asked about AH/VH - reports " Yes I have hallucinations." However is unable to explain and elaborate . Pt reports he is paranoid often and feels people are following him. Pt denies any sleep issues or appetite changes.Pt denies any hx of sexual or physical abuse. Pt denies any mood lability. Pt reports having suicidal thoughts , but reports he does not have a plan. Pt has several psychosocial stressors - he is unemployed , dropped off [employer], moved back in with parents in [place] , reports he wants to go to [place] or [employer]. Pt reports alcohol and cannabis abuse hx - however does not elaborate - please see above previous notes in EHR for further details. Pt reports he was admitted at [facility] in the past - approximately 2 yrs ago - for 5 days - was discharged - he did  follow up with [facility] x2  ,  did not  continue his medications. Will need to obtain medical records from [facility].   ?  Associated Signs/Symptoms: Depression Symptoms:  depressed mood, difficulty concentrating, suicidal thoughts without plan, (Hypo) Manic Symptoms:  Hallucinations, Impulsivity, Anxiety Symptoms:  Excessive Worry, Panic Symptoms, Psychotic Symptoms:  Hallucinations: Auditory Visual Paranoia, PTSD Symptoms: Negative Total Time spent with patient: 45 minutes  Past Psychiatric History: PT with hx of anxiety, depression, substance abuse - IP admission at [facility] - 2 yrs ago ,  Reports out patient follow up with [facility] .Denies suicide  attempts.  Is the patient at risk to self? Yes.    Has the patient been a risk to self in the past 6 months? No.  Has the patient been a risk to self within the distant past? Yes.    Is the patient a risk to others? Yes.    Has the patient been a risk to others in the past 6 months? No.  Has the patient been a risk to others within the distant past? Yes.     Prior Inpatient Therapy:   Prior Outpatient Therapy:    Alcohol  Screening: 1. How often do you have a drink containing alcohol?: 2 to 3 times a week 2. How many drinks containing alcohol do you have on a typical day when you are drinking?: 5 or 6 3. How often do you have six or more drinks on one occasion?: Weekly Preliminary Score: 5 4. How often during the last year have you found that you were not able to stop drinking once you had started?: Monthly 5. How often during the last year have you failed to do what was normally expected from you becasue of drinking?: Monthly 6. How often during the last year have you needed a first drink in the morning to get yourself going after a heavy drinking session?: Less than monthly 7. How often during the last year have you had a feeling of guilt of remorse after drinking?: Less than monthly 8. How often during the last year have you been unable to remember what happened the night before because you had been drinking?: Less than monthly 9. Have you or someone else been injured as a result of your drinking?: No 10. Has a relative or friend or a doctor or another health worker been concerned about your drinking or suggested you cut down?: No Alcohol Use Disorder Identification Test Final Score (AUDIT): 15 Brief Intervention: Yes Substance Abuse History in the last 12 months:  Yes.  cannabis , alcohol, pt tries to minimize use Consequences of Substance Abuse: Medical Consequences:   contributed to current admission - Previous Psychotropic Medications: Yes - Does not remember Psychological Evaluations: No  Past Medical History:  Past Medical History:  Diagnosis Date  . Eczema    History reviewed. No pertinent surgical history. Family History: denies hx of HTN, DM, Thyroid disease. Family History  Problem Relation Age of Onset  . Mental illness Neg Hx    Family Psychiatric  History: denies hx of mental illness Tobacco Screening: Have you used any form of tobacco in the last 30 days? (Cigarettes, Smokeless Tobacco,  Cigars, and/or Pipes): Yes Tobacco use, Select all that apply: 5 or more cigarettes per day Are you interested in Tobacco Cessation Medications?: No, patient refused Counseled patient on smoking cessation including recognizing danger situations, developing coping skills and basic information about quitting provided: Refused/Declined practical counseling Social History:  History  Alcohol Use  . Yes    Comment: 3-4 40oz beers a day     History  Drug Use No    Additional Social History:                           Allergies:  No Known Allergies Lab Results:  Results for orders placed or performed during the hospital encounter of 04/03/16 (from the past 48 hour(s))  Lipid panel     Status: None   Collection Time: 04/03/16  6:14 AM  Result Value Ref Range  Cholesterol 151 0 - 200 mg/dL   Triglycerides 72 <161 mg/dL   HDL 46 >09 mg/dL   Total CHOL/HDL Ratio 3.3 RATIO   VLDL 14 0 - 40 mg/dL   LDL Cholesterol 91 0 - 99 mg/dL    Comment:        Total Cholesterol/HDL:CHD Risk Coronary Heart Disease Risk Table                     Men   Women  1/2 Average Risk   3.4   3.3  Average Risk       5.0   4.4  2 X Average Risk   9.6   7.1  3 X Average Risk  23.4   11.0        Use the calculated Patient Ratio above and the CHD Risk Table to determine the patient's CHD Risk.        ATP III CLASSIFICATION (LDL):  <100     mg/dL   Optimal  604-540  mg/dL   Near or Above                    Optimal  130-159  mg/dL   Borderline  981-191  mg/dL   High  >478     mg/dL   Very High Performed at [facility]   TSH     Status: None   Collection Time: 04/03/16  6:14 AM  Result Value Ref Range   TSH 1.226 0.350 - 4.500 uIU/mL    Comment: Performed by a 3rd Generation assay with a functional sensitivity of <=0.01 uIU/mL. Performed at [facility]     Blood Alcohol level:  Lab Results  Component Value Date   [facility] <5 04/02/2016    Metabolic Disorder Labs:   No results found for: HGBA1C, MPG No results found for: PROLACTIN Lab Results  Component Value Date   CHOL 151 04/03/2016   TRIG 72 04/03/2016   HDL 46 04/03/2016   CHOLHDL 3.3 04/03/2016   VLDL 14 04/03/2016   LDLCALC 91 04/03/2016    Current Medications: Current Facility-Administered Medications  Medication Dose Route Frequency Provider Last Rate Last Dose  . acetaminophen (TYLENOL) tablet 650 mg  650 mg Oral Q6H PRN Jackelyn Poling, NP      . alum & mag hydroxide-simeth (MAALOX/MYLANTA) 200-200-20 MG/5ML suspension 30 mL  30 mL Oral Q4H PRN Jackelyn Poling, NP      . ARIPiprazole (ABILIFY) tablet 5 mg  5 mg Oral QHS Nathanial Arrighi, MD      . hydrOXYzine (ATARAX/VISTARIL) tablet 25 mg  25 mg Oral TID PRN Jackelyn Poling, NP      . magnesium hydroxide (MILK OF MAGNESIA) suspension 30 mL  30 mL Oral Daily PRN Jackelyn Poling, NP      . OLANZapine (ZYPREXA) tablet 5 mg  5 mg Oral TID PRN Jomarie Longs, MD       Or  . OLANZapine (ZYPREXA) injection 5 mg  5 mg Intramuscular TID PRN Jomarie Longs, MD      . sertraline (ZOLOFT) tablet 25 mg  25 mg Oral Daily Kendricks Reap, MD   25 mg at 04/03/16 1041  . traZODone (DESYREL) tablet 50 mg  50 mg Oral QHS Jomarie Longs, MD       PTA Medications: Prescriptions Prior to Admission  Medication Sig Dispense Refill Last Dose  . sucralfate (CARAFATE) 1 GM/10ML suspension Take 10 mLs (1 g  total) by mouth 4 (four) times daily -  with meals and at bedtime. 420 mL 1 03/29/2016 at Unknown time    Musculoskeletal: Strength & Muscle Tone: within normal limits Gait & Station: normal Patient leans: N/A  Psychiatric Specialty Exam: Physical Exam  Nursing note and vitals reviewed. Constitutional:  I concur with PE done in ED.    Review of Systems  Psychiatric/Behavioral: Positive for depression, hallucinations, substance abuse and suicidal ideas. The patient is nervous/anxious.   All other systems reviewed and are negative.   Blood pressure 115/81,  pulse 75, temperature 97.5 F (36.4 C), temperature source Oral, resp. rate 18, height 5' 10.5" (1.791 m), weight 65.3 kg (144 lb).Body mass index is 20.37 kg/m.  General Appearance: Guarded  Eye Contact:  Poor  Speech:  Normal Rate  Volume:  Decreased  Mood:  Anxious, Depressed and Dysphoric  Affect:  Depressed  Thought Process:  Goal Directed and Descriptions of Associations: Circumstantial  Orientation:  Full (Time, Place, and Person)  Thought Content:  Hallucinations: Auditory Visual, Paranoid Ideation and Rumination patient unable to elaborate his AH/VH   Suicidal Thoughts:  Yes.  Contracts for safety  Homicidal Thoughts:  No  Memory:  Immediate;   Fair Recent;   Fair Remote;   Fair  Judgement:  Impaired  Insight:  Shallow  Psychomotor Activity:  Normal  Concentration:  Concentration: Fair and Attention Span: Fair  Recall:  FiservFair  Fund of Knowledge:  Fair  Language:  Fair  Akathisia:  No  Handed:  Right  AIMS (if indicated):     Assets:  Desire for Improvement  ADL's:  Intact  Cognition:  WNL  Sleep:  Number of Hours: 2.75    Treatment Plan Summary:Bradley Cummings is a 25 y.o.AA male , who is single, unemployed , [facility] [place] , but [place] , has a hx of anxiety,depression , who [facility]  [facility]  reporting symptoms of anxiety and paranoia.   Patient today seen as depressed, anxious , paranoid - will start treatment and observe on the unit.  Daily contact with patient to assess and evaluate symptoms and progress in treatment and Medication management   Patient will benefit from inpatient treatment and stabilization.  Estimated length of stay is 5-7 days.  Reviewed past medical records,treatment plan.  Will start a trial of Abilify 5 mg po qhs for psychosis/mood sx. Will add Zoloft 25 mg po daily for affective sx. Will make available PRN medications as per agitation protocol. Will  continue to monitor vitals ,medication compliance and treatment side effects while patient is here.  Will monitor for medical issues as well as call consult as needed.  Reviewed labs cbc - wnl, cmp - wnl, uds - negative, tsh - wnl, BAL;<5, UA - WNL , will get TSH, lipid panel, hba1c, pl as well as ekg for qtc. [facility] CSW will start working on disposition.  Patient to participate in therapeutic milieu .      Observation Level/Precautions:  15 minute checks    Psychotherapy:  Individual and group therapy     Consultations:  CSW/RT  Discharge Concerns:  Stability and safety       Physician Treatment Plan for Primary Diagnosis: MDD (major depressive disorder), single episode, severe with psychosis (HCC) Long Term Goal(s): Improvement in symptoms so as ready for discharge  Short Term Goals: Ability to verbalize feelings will improve and  Compliance with prescribed medications will improve  Physician Treatment Plan for Secondary Diagnosis: Principal Problem:   MDD (major depressive disorder), single episode, severe with psychosis (HCC) Active Problems:   Alcohol use disorder, moderate, dependence (HCC)   Cannabis use disorder, mild, abuse  Long Term Goal(s): Improvement in symptoms so as ready for discharge  Short Term Goals: Ability to verbalize feelings will improve and Compliance with prescribed medications will improve  I certify that inpatient services furnished can reasonably be expected to improve the patient's condition.    Ivi Griffith, MD 11/3/201711:53 AM

## 2016-04-03 NOTE — [facility] Group Notes (Signed)
Psychoeducational Group Note  Date:  04/03/2016 Time:  10:10 PM  Group Topic/Focus:  Wrap-Up Group:   The focus of this group is to help patients review their daily goal of treatment and discuss progress on daily workbooks.   Participation Level:  Minimal  Participation Quality:  Appropriate  Affect:  Appropriate  Cognitive:  Appropriate  Insight:  Appropriate  Engagement in Group:  Engaged  Modes of Intervention:  Discussion  Additional Comments:  Patient attended and participated in group.  Today, patient's were asked "If you could have any super power, what would it be?".  Patient identified as his super power as "Keep living. Live forever" and stated "I would also be like superman because he is a good guy and has a good heart".  Patient rates his day a "6,7, or 8" with 10 being the best and states "I had a good day and slept good last night".  Larry SierrasMiddleton, Anamika Kueker P 04/03/2016, 10:10 PM

## 2016-04-03 NOTE — [facility] Suicide Risk Assessment (Signed)
[facility] INPATIENT:  Family/Significant Other Suicide Prevention Education  Suicide Prevention Education:  Education Completed;No one  has been identified by the patient as the family member/significant other with whom the patient will be residing, and identified as the person(s) who will aid the patient in the event of a mental health crisis (suicidal ideations/suicide attempt).  With written consent from the patient, the family member/significant other has been provided the following suicide prevention education, prior to the and/or following the discharge of the patient.  The suicide prevention education provided includes the following:  Suicide risk factors  Suicide prevention and interventions  National Suicide Hotline telephone number  [facility] assessment telephone number  [facility] Emergency Assistance 911  [facility] and/or Residential Mobile Crisis Unit telephone number  Request made of family/significant other to:  Remove weapons (e.g., guns, rifles, knives), all items previously/currently identified as safety concern.    Remove drugs/medications (over-the-counter, prescriptions, illicit drugs), all items previously/currently identified as a safety concern.  The family member/significant other verbalizes understanding of the suicide prevention education information provided.  The family member/significant other agrees to remove the items of safety concern listed above. Patient did not endorse suicidal ideations prior to or during admission. SPE not required.   Bradley Cummings 04/03/2016, 12:36 PM

## 2016-04-03 NOTE — Tx Team (Signed)
Initial Treatment Plan 04/03/2016 1:27 AM Diesel Asencion GowdaShol LKG:[id number]    PATIENT STRESSORS: Substance abuse Other: psychosis   PATIENT STRENGTHS: General fund of knowledge Motivation for treatment/growth Supportive family/friends   PATIENT IDENTIFIED PROBLEMS: Risk for suicide  psychosis  "work on my mind"  anxiety  Paranoia             DISCHARGE CRITERIA:  Improved stabilization in mood, thinking, and/or behavior Verbal commitment to aftercare and medication compliance  PRELIMINARY DISCHARGE PLAN: Attend aftercare/continuing care group Outpatient therapy  PATIENT/FAMILY INVOLVEMENT: This treatment plan has been presented to and reviewed with the patient, Bradley Cummings.  The patient and family have been given the opportunity to ask questions and make suggestions.  Bradley HaringPhillips, Undrea Archbold A, RN 04/03/2016, 1:27 AM

## 2016-04-03 NOTE — Progress Notes (Signed)
Recreation Therapy Notes  Date: 04/03/16 Time: 1000 Location: 500 Hall Dayroom  Group Topic: Communication  Goal Area(s) Addresses:  Patient will effectively communicate with peers in group.  Patient will verbalize benefit of healthy communication. Patient will verbalize positive effect of healthy communication on post d/c goals.  Patient will identify communication techniques that made activity effective for group.   Intervention:  Jenga, cards, UNO, checkers, tables and chairs   Activity: Leisure Stations.  LRT set up activity stations in the dayroom with activities such as UNO, Jenga, spades and checkers.  Patients were to pick an activity to participate in.  Patients were to use problem solving, coordination and communication skills to complete the activities.  Education: Communication, Discharge Planning  Education Outcome: Acknowledges understanding/In group clarification offered/Needs additional education.   Clinical Observations/Feedback: Pt did not attend group.   Jonette Wassel, LRT/CTRS         Bradley Cummings A 04/03/2016 12:52 PM 

## 2016-04-03 NOTE — Progress Notes (Signed)
Patient has been isolative to his room but he did  attend group this evening and has voiced no complaints.  He was compliant with his scheduled medications. Patient currently denies having pain, -si/hi/a/v hall. Support and encouragement offered, safety maintained on unit, will continue to monitor.

## 2016-04-03 NOTE — Tx Team (Signed)
Interdisciplinary Treatment and Diagnostic Plan Update  04/03/2016 Time of Session: 3:40 PM  Sunil Hue MRN: [id number]  Principal Diagnosis: MDD (major depressive disorder), single episode, severe with psychosis ([place])  Secondary Diagnoses: Principal Problem:   MDD (major depressive disorder), single episode, severe with psychosis ([place]) Active Problems:   Alcohol use disorder, moderate, dependence ([place])   Cannabis use disorder, mild, abuse   Current Medications:  Current Facility-Administered Medications  Medication Dose Route Frequency Provider Last Rate Last Dose  . acetaminophen (TYLENOL) tablet 650 mg  650 mg Oral Q6H PRN Rozetta Nunnery, NP      . alum & mag hydroxide-simeth (MAALOX/MYLANTA) 200-200-20 MG/5ML suspension 30 mL  30 mL Oral Q4H PRN Rozetta Nunnery, NP      . ARIPiprazole (ABILIFY) tablet 5 mg  5 mg Oral QHS Saramma Eappen, MD      . hydrOXYzine (ATARAX/VISTARIL) tablet 25 mg  25 mg Oral TID PRN Rozetta Nunnery, NP      . magnesium hydroxide (MILK OF MAGNESIA) suspension 30 mL  30 mL Oral Daily PRN Rozetta Nunnery, NP      . OLANZapine (ZYPREXA) tablet 5 mg  5 mg Oral TID PRN Ursula Alert, MD       Or  . OLANZapine (ZYPREXA) injection 5 mg  5 mg Intramuscular TID PRN Ursula Alert, MD      . sertraline (ZOLOFT) tablet 25 mg  25 mg Oral Daily Saramma Eappen, MD   25 mg at 04/03/16 1041  . traZODone (DESYREL) tablet 50 mg  50 mg Oral QHS Ursula Alert, MD        PTA Medications: Prescriptions Prior to Admission  Medication Sig Dispense Refill Last Dose  . sucralfate (CARAFATE) 1 GM/10ML suspension Take 10 mLs (1 g total) by mouth 4 (four) times daily -  with meals and at bedtime. 420 mL 1 03/29/2016 at Unknown time    Treatment Modalities: Medication Management, Group therapy, Case management,  1 to 1 session with clinician, Psychoeducation, Recreational therapy.   Physician Treatment Plan for Primary Diagnosis: MDD (major depressive disorder), single episode,  severe with psychosis ([place]) Long Term Goal(s): Improvement in symptoms so as ready for discharge  Short Term Goals: Ability to verbalize feelings will improve  Medication Management: Evaluate patient's response, side effects, and tolerance of medication regimen.  Therapeutic Interventions: 1 to 1 sessions, Unit Group sessions and Medication administration.  Evaluation of Outcomes: Not Met  Physician Treatment Plan for Secondary Diagnosis: Principal Problem:   MDD (major depressive disorder), single episode, severe with psychosis ([place]) Active Problems:   Alcohol use disorder, moderate, dependence ([place])   Cannabis use disorder, mild, abuse   Long Term Goal(s): Improvement in symptoms so as ready for discharge  Short Term Goals: Compliance with prescribed medications will improve  Medication Management: Evaluate patient's response, side effects, and tolerance of medication regimen.  Therapeutic Interventions: 1 to 1 sessions, Unit Group sessions and Medication administration.  Evaluation of Outcomes: Not Met   RN Treatment Plan for Primary Diagnosis: MDD (major depressive disorder), single episode, severe with psychosis ([place]) Long Term Goal(s): Knowledge of disease and therapeutic regimen to maintain health will improve  Short Term Goals: Ability to verbalize feelings will improve and Compliance with prescribed medications will improve  Medication Management: RN will administer medications as ordered by provider, will assess and evaluate patient's response and provide education to patient for prescribed medication. RN will report any adverse and/or side effects to prescribing provider.  Therapeutic  Interventions: 1 on 1 counseling sessions, Psychoeducation, Medication administration, Evaluate responses to treatment, Monitor vital signs and CBGs as ordered, Perform/monitor CIWA, COWS, AIMS and Fall Risk screenings as ordered, Perform wound care treatments as ordered.  Evaluation of  Outcomes: Not Met   LCSW Treatment Plan for Primary Diagnosis: MDD (major depressive disorder), single episode, severe with psychosis ([place]) Long Term Goal(s): Safe transition to appropriate next level of care at discharge, Engage patient in therapeutic group addressing interpersonal concerns.  Short Term Goals: Engage patient in aftercare planning with referrals and resources and Facilitate acceptance of mental health diagnosis and concerns  Therapeutic Interventions: Assess for all discharge needs, 1 to 1 time with Social worker, Explore available resources and support systems, Assess for adequacy in community support network, Educate family and significant other(s) on suicide prevention, Complete Psychosocial Assessment, Interpersonal group therapy.  Evaluation of Outcomes: Not Met   Progress in Treatment: Attending groups: No Participating in groups: No Taking medication as prescribed: Yes, MD continues to assess for medication changes as needed Toleration medication: Yes, no side effects reported at this time Family/Significant other contact made: No Patient understands diagnosis: Limited insight  Discussing patient identified problems/goals with staff: Yes Medical problems stabilized or resolved: Yes Denies suicidal/homicidal ideation: Yes Issues/concerns per patient self-inventory: None Other: N/A  New problem(s) identified: None identified at this time.   New Short Term/Long Term Goal(s): None identified at this time.   Discharge Plan or Barriers: Return home, follow up outpatient   Reason for Continuation of Hospitalization: Anxiety Delusions  Depression Hallucinations Medication stabilization   Estimated Length of Stay: 3-5 days  Attendees: Patient: 04/03/2016  3:40 PM  Physician: Dr. Shea Evans 04/03/2016  3:40 PM  Nursing: Santiago Glad.S RN 04/03/2016  3:40 PM  RN Care Manager: Lars Pinks 04/03/2016  3:40 PM  Social Worker: Ripley Fraise, LCSW 04/03/2016  3:40 PM   Recreational Therapist: Winfield Cunas 04/03/2016  3:40 PM  Other: Radonna Ricker, Social Work Intern  04/03/2016  3:40 PM  Other:  04/03/2016  3:40 PM  Other: 04/03/2016  3:40 PM    Scribe for Treatment Team: Radonna Ricker, Social Work Intern 04/03/2016 3:40 PM

## 2016-04-03 NOTE — Progress Notes (Signed)
D: Patient up and visible in the milieu. Spoke with patient 1:1. Rates sleep as fair, appetite as fair, energy as high and concentration as good. Patient's affect anxious and flat with anxious and suspicious mood. Rating depression at a 5/10, hopelessness at a 3/10 and anxiety at a 4/10. States goal for today is to "concentrate on my everyday life" and "focus on my day to day agendas." Denies pain, physical problems.   A: No medications (scheduled) ordered at this time. No prn's requested. Emotional support offered and self inventory reviewed. Discussed POC with MD, SW.     R: Patient verbalizes understanding of POC. Patient denies SI/HI. While he denies AVH, he appears to respond to internal stimuli by smiling to self and appearing preoccupied. He remains safe on level III obs.

## 2016-04-03 NOTE — ED Notes (Signed)
[facility] and [facility] called for transportation.

## 2016-04-03 NOTE — [facility] Suicide Risk Assessment (Signed)
[facility]   Nursing information obtained from:    Demographic factors:    Current Mental Status:    Loss Factors:    Historical Factors:    Risk Reduction Factors:     Total Time spent with patient: 30 minutes Principal Problem: MDD (major depressive disorder), single episode, severe with psychosis (HCC) Diagnosis:   Patient Active Problem List   Diagnosis Date Noted  . MDD (major depressive disorder), single episode, severe with psychosis (HCC) [F32.3] 04/03/2016  . Alcohol use disorder, moderate, dependence (HCC) [F10.20] 04/03/2016  . Cannabis use disorder, mild, abuse [F12.10] 04/03/2016   Subjective Data: Please see H&P.   Continued Clinical Symptoms:  Alcohol Use Disorder Identification Test Final Score (AUDIT): 15 The "Alcohol Use Disorders Identification Test", Guidelines for Use in Primary Care, Second Edition.  World Science writerHealth Organization [facility](WHO). Score between 0-7:  no or low risk or alcohol related problems. Score between 8-15:  moderate risk of alcohol related problems. Score between 16-19:  high risk of alcohol related problems. Score 20 or above:  warrants further diagnostic evaluation for alcohol dependence and treatment.   CLINICAL FACTORS:   Severe Anxiety and/or Agitation Panic Attacks Depression:   Comorbid alcohol abuse/dependence Alcohol/Substance Abuse/Dependencies   Musculoskeletal: Strength & Muscle Tone: within normal limits Gait & Station: normal Patient leans: N/A  Psychiatric Specialty Exam: Physical Exam  ROS  Blood pressure 115/81, pulse 75, temperature 97.5 F (36.4 C), temperature source Oral, resp. rate 18, height 5' 10.5" (1.791 m), weight 65.3 kg (144 lb).Body mass index is 20.37 kg/m.                    Please see H&P.                                       COGNITIVE FEATURES THAT CONTRIBUTE TO RISK:  Closed-mindedness, Polarized thinking and Thought constriction (tunnel  vision)    SUICIDE RISK:   Mild:  Suicidal ideation of limited frequency, intensity, duration, and specificity.  There are no identifiable plans, no associated intent, mild dysphoria and related symptoms, good self-control (both objective and subjective assessment), few other risk factors, and identifiable protective factors, including available and accessible social support.   PLAN OF CARE: Please see H&P.   I certify that inpatient services furnished can reasonably be expected to improve the patient's condition.  Nael Petrosyan, MD 04/03/2016, 11:26 AM

## 2016-04-03 NOTE — [facility] Group Notes (Signed)
[facility] LCSW Group Therapy  04/03/2016  1:05 PM  Type of Therapy:  Group therapy  Participation Level:  Active  Participation Quality:  Attentive  Affect:  Flat  Cognitive:  Oriented  Insight:  Limited  Engagement in Therapy:  Limited  Modes of Intervention:  Discussion, Socialization  Summary of Progress/Problems:  Chaplain was here to lead a group on themes of hope and courage. Invited.  Chose to not attend.  Daryel Geraldorth, Miller Edging[place] B 04/03/2016 12:53 PM

## 2016-04-04 LAB — PROLACTIN: Prolactin: 27.6 ng/mL — ABNORMAL HIGH (ref 4.0–15.2)

## 2016-04-04 LAB — HEMOGLOBIN A1C
Hgb A1c MFr Bld: 5.7 % — ABNORMAL HIGH (ref 4.8–5.6)
Mean Plasma Glucose: 117 mg/dL

## 2016-04-04 MED ORDER — ARIPIPRAZOLE 15 MG PO TABS
7.5000 mg | ORAL_TABLET | Freq: Every day | ORAL | Status: DC
  Administered 2016-04-04: 7.5 mg via ORAL
  Filled 2016-04-04 (×3): qty 1

## 2016-04-04 NOTE — Progress Notes (Signed)
Pt did not attend group meeting this evening.  

## 2016-04-04 NOTE — Progress Notes (Signed)
DAR NOTE: Patient presents with flat affect and depressed mood.  Denies pain, auditory and visual hallucinations.  Rates depression at 4, hopelessness at 5, and anxiety at 4.  Described energy level as normal and concentration as good.  Maintained on routine safety checks.  Medications given as prescribed.  Support and encouragement offered as needed.  States goal for today is "sleeping pattern."  Patient is withdrawn and isolative to his room.  Patient encouraged to be visible in milieu.  No interaction with staff or peers.

## 2016-04-04 NOTE — Progress Notes (Signed)
D: Patient alert and oriented x 3. Patient denies SI/HI/AVH. Patient has been isolative in room and did not go to group. Patient did come to medication window to take scheduled medications.  A: Staff to monitor Q 15 mins for safety. Encouragement and support offered. Scheduled medications administered per orders. R: Patient remains safe on the unit. Patient didn't attended group tonight. Patient visible on the unit. Patient taking administered medications.

## 2016-04-04 NOTE — [facility] Group Notes (Signed)
Adult Group Therapy Note  Date:  04/04/2016  Time: 11:15AM-12:00PM  Group Topic/Focus: Today's group focused on the topic of fear and healthy coping skills.  An exercise was performed which elicited sources of fear that various patients feel, giving an opportunity for other patients to identify with that fear.  After a discussion of each, a coping skill was named that could be attempted in the future when such a fear arises.    Participation Level:  Minimal  Participation Quality:  Inattentive  Affect:  Flat  Cognitive:  Disorganized  Insight: Lacking  Engagement in Group:  Limited  Modes of Intervention:  Activity, Discussion and Support  Additional Comments:  The patient expressed little in group, and only participated in the exercise and discussion when called on directly.  He displayed some thought blocking at times.  Carloyn JaegerMareida J Grossman-Orr 04/04/2016 , 1:25 PM

## 2016-04-04 NOTE — Progress Notes (Signed)
Adult Psychoeducational Group Note  Date:  04/04/2016 Time:  10:56 AM  Group Topic/Focus:  Making Healthy Choices:   The focus of this group is to help patients identify negative/unhealthy choices they were using prior to admission and identify positive/healthier coping strategies to replace them upon discharge.   Participation Level:  Did Not Attend  Bradley Cummings 04/04/2016, 10:56 AM

## 2016-04-04 NOTE — Progress Notes (Signed)
[facility]  04/04/2016 11:59 AM Bradley Cummings  MRN:  [id number] Subjective:  Patient states " I am ok, my hallucinations are better."  Objective:Bradley Cummings, who is single, unemployed , [facility] off [facility] [employer] [place] , but [place] , has a hx of anxiety,depression , who presented  [facility]arily[facility]ied by [facility]  reporting symptoms of anxiety and paranoia.   Patient seen and chart reviewed.Discussed patient with treatment team.  Patient today is seen as withdrawn , isolative , guarded , paranoid . Pt responds to questions during evaluation are very short and mostly in monosyllables . Pt reports his "hallucinations" are better - but does not elaborate on it. Pt per staff - has been taking his medications , denies ADRs - however seen often as withdrawn , requiring encouragement and support. Will continue treatment.      Principal Problem: MDD (major depressive disorder), single episode, severe with psychosis (HCC) Diagnosis:   Patient Active Problem List   Diagnosis Date Noted  . MDD (major depressive disorder), single episode, severe with psychosis (HCC) [F32.3] 04/03/2016  . Alcohol use disorder, moderate, dependence (HCC) [F10.20] 04/03/2016  . Cannabis use disorder, mild, abuse [F12.10] 04/03/2016   Total Time spent with patient: 25 minutes  Past Psychiatric History: Please see H&P.   Past Medical History:  Past Medical History:  Diagnosis Date  . Eczema    History reviewed. No pertinent surgical history. Family History:  Family History  Problem Relation Age of Onset  . Mental illness Neg Hx    Family Psychiatric  History: Please see H&P.  Social History: Please see H&P.  History  Alcohol Use  . Yes    Comment: 3-4 40oz beers a day     History  Drug Use No    Social History   Social History  . Marital status: Single    Spouse name: N/A  . Number of children: N/A  . Years of education:  N/A   Social History Main Topics  . Smoking status: Current Every Day Smoker    Packs/day: 0.25    Years: 3.00    Types: Cigarettes  . Smokeless tobacco: Never Used  . Alcohol use Yes     Comment: 3-4 40oz beers a day  . Drug use: No  . Sexual activity: No   Other Topics Concern  . None   Social History Narrative  . None   Additional Social History:                         Sleep: Fair  Appetite:  Fair  Current Medications: Current Facility-Administered Medications  Medication Dose Route Frequency Provider Last Rate Last Dose  . acetaminophen (TYLENOL) tablet 650 mg  650 mg Oral Q6H PRN Jackelyn PolingJason A Berry, NP      . alum & mag hydroxide-simeth (MAALOX/MYLANTA) 200-200-20 MG/5ML suspension 30 mL  30 mL Oral Q4H PRN Jackelyn PolingJason A Berry, NP      . ARIPiprazole (ABILIFY) tablet 7.5 mg  7.5 mg Oral QHS Alexander Aument, MD      . hydrOXYzine (ATARAX/VISTARIL) tablet 25 mg  25 mg Oral TID PRN Jackelyn PolingJason A Berry, NP      . magnesium hydroxide (MILK OF MAGNESIA) suspension 30 mL  30 mL Oral Daily PRN Jackelyn PolingJason A Berry, NP      . OLANZapine (ZYPREXA) tablet 5 mg  5 mg Oral TID PRN Jomarie LongsSaramma Eli Pattillo, MD  Or  . OLANZapine (ZYPREXA) injection 5 mg  5 mg Intramuscular TID PRN Jomarie Longs, MD      . sertraline (ZOLOFT) tablet 25 mg  25 mg Oral Daily Jomarie Longs, MD   25 mg at 04/04/16 0859  . traZODone (DESYREL) tablet 50 mg  50 mg Oral QHS Jomarie Longs, MD   50 mg at 04/03/16 2109    Lab Results:  Results for orders placed or performed during the hospital encounter of 04/03/16 (from the past 48 hour(s))  Hemoglobin A1c     Status: Abnormal   Collection Time: 04/03/16  6:14 AM  Result Value Ref Range   Hgb A1c MFr Bld 5.7 (H) 4.8 - 5.6 %    Comment: (NOTE)         Pre-diabetes: 5.7 - 6.4         Diabetes: >6.4         Glycemic control for adults with diabetes: <7.0    Mean Plasma Glucose 117 mg/dL    Comment: (NOTE) Performed At: [facility] [address] [place],  [place] [id number] Mila Homer MD [id number] Performed at [facility]   Lipid panel     Status: None   Collection Time: 04/03/16  6:14 AM  Result Value Ref Range   Cholesterol 151 0 - 200 mg/dL   Triglycerides 72 <956 mg/dL   HDL 46 >21 mg/dL   Total CHOL/HDL Ratio 3.3 RATIO   VLDL 14 0 - 40 mg/dL   LDL Cholesterol 91 0 - 99 mg/dL    Comment:        Total Cholesterol/HDL:CHD Risk Coronary Heart Disease Risk Table                     Men   Women  1/2 Average Risk   3.4   3.3  Average Risk       5.0   4.4  2 X Average Risk   9.6   7.1  3 X Average Risk  23.4   11.0        Use the calculated Patient Ratio above and the CHD Risk Table to determine the patient's CHD Risk.        ATP III CLASSIFICATION (LDL):  <100     mg/dL   Optimal  308-657  mg/dL   Near or Above                    Optimal  130-159  mg/dL   Borderline  846-962  mg/dL   High  >952     mg/dL   Very High Performed at [facility]   TSH     Status: None   Collection Time: 04/03/16  6:14 AM  Result Value Ref Range   TSH 1.226 0.350 - 4.500 uIU/mL    Comment: Performed by a 3rd Generation assay with a functional sensitivity of <=0.01 uIU/mL. Performed at [facility]   Prolactin     Status: Abnormal   Collection Time: 04/03/16  6:14 AM  Result Value Ref Range   Prolactin 27.6 (H) 4.0 - 15.2 ng/mL    Comment: (NOTE) Performed At: [facility] [address] [place], [place] [id number] Mila Homer MD UU:[id number] Performed at [facility]     Blood Alcohol level:  Lab Results  Component Value Date   [facility] <5 04/02/2016    Metabolic Disorder Labs: Lab Results  Component Value Date  HGBA1C 5.7 (H) 04/03/2016   MPG 117 04/03/2016   Lab Results  Component Value Date   PROLACTIN 27.6 (H) 04/03/2016   Lab Results  Component Value Date   CHOL 151 04/03/2016   TRIG 72 04/03/2016   HDL 46 04/03/2016   CHOLHDL 3.3  04/03/2016   VLDL 14 04/03/2016   LDLCALC 91 04/03/2016    Physical Findings: AIMS:  , ,  ,  ,    CIWA:    COWS:     Musculoskeletal: Strength & Muscle Tone: within normal limits Gait & Station: normal Patient leans: N/A  Psychiatric Specialty Exam: Physical Exam  Nursing note and vitals reviewed.   Review of Systems  All other systems reviewed and are negative.   Blood pressure 107/68, pulse 97, temperature 97.6 F (36.4 C), temperature source Oral, resp. rate 17, height 5' 10.5" (1.791 m), weight 65.3 kg (144 lb).Body mass index is 20.37 kg/m.  General Appearance: Guarded  Eye Contact:  Poor  Speech:  Slow  Volume:  Decreased  Mood:  Anxious, Depressed and Dysphoric  Affect:  Depressed  Thought Process:  Goal Directed and Descriptions of Associations: Intact  Orientation:  Full (Time, Place, and Person)  Thought Content:  Hallucinations: does not elaborate and Paranoid Ideation  Suicidal Thoughts:  No but appears paranoid , delusional - potential danger to self or others  Homicidal Thoughts:  No  Memory:  Immediate;   Fair Recent;   Fair Remote;   Poor  Judgement:  Impaired  Insight:  Shallow  Psychomotor Activity:  Decreased  Concentration:  Concentration: Poor and Attention Span: Poor  Recall:  [employer]Fair  Fund of Knowledge:  Fair  Language:  Fair  Akathisia:  No  Handed:  Right  AIMS (if indicated):     Assets:  Desire for Improvement  ADL's:  Intact  Cognition:  WNL  Sleep:  Number of Hours: 6     Treatment Plan Summary:Bradley Cummings a 24 Cummings, who is single, unemployed , [facility] [employer] [place] , but [place] , has a hx of anxiety,depression , who [facility]  reporting symptoms of anxiety and paranoia.    Patient today continues to present as delusional, guarded , paranoid , depressed - is withdrawn , isolative - will continue to readjust medications.  Will continue  today 04/04/16  plan as below except where it is noted.  Daily contact with patient to assess and evaluate symptoms and progress in treatment and Medication management Will increase  Abilify to 7. 5 mg po qhs for psychosis/mood sx. Will continue Zoloft 25 mg po daily for affective sx. Will continue  PRN medications as per agitation protocol. Will continue to monitor vitals ,medication compliance and treatment side effects while patient is here.  Will monitor for medical issues as well as call consult as needed.  Reviewed labs cbc - wnl, cmp - wnl, uds - negative, tsh - wnl, BAL;<5, UA - WNL , I have reviewed  TSH- wnl , lipid panel- wnl  hba1c- wnl , pl- 27.6 ( will need to be monitored ) . I have reviewed  ekg for qtc- wnl . Will obtain medical records from [facility].CSW is working on the same. CSW will continue working on disposition.  Patient to participate in therapeutic milieu .    Bradley Laprade, MD 04/04/2016, 11:59 AM

## 2016-04-05 MED ORDER — ARIPIPRAZOLE 10 MG PO TABS
10.0000 mg | ORAL_TABLET | Freq: Every day | ORAL | Status: DC
  Administered 2016-04-05 – 2016-04-06 (×2): 10 mg via ORAL
  Filled 2016-04-05 (×4): qty 1

## 2016-04-05 MED ORDER — SERTRALINE HCL 50 MG PO TABS
50.0000 mg | ORAL_TABLET | Freq: Every day | ORAL | Status: DC
  Administered 2016-04-06 – 2016-04-07 (×2): 50 mg via ORAL
  Filled 2016-04-05 (×4): qty 1

## 2016-04-05 NOTE — Progress Notes (Signed)
DAR NOTE: Patient presents with anxious affect and depressed mood.  Denies pain, auditory and visual hallucinations.  Rates depression at 6, hopelessness at 7, and anxiety at 5.  Maintained on routine safety checks.  Medications given as prescribed.  Support and encouragement offered as needed.  Attended group and participated.   Patient remained withdrawn and isolative.  Offered no complaint.

## 2016-04-05 NOTE — Progress Notes (Addendum)
Patient ID: Bradley Cummings, male   DOB: March 29, 1991, 25 y.o.   MRN: [id number]  D: Patient observed sitting in dark room alone. Pt denies A/VH but appeared to be responding to internal stimuli. Pt isolates and not interacting with peers. Pt answered all of writers questions. Pt stated sleeping better and plans to get a job after discharge and play basketball with friends. Pt was prompted but did not attend evening wrap up group. Denies  SI/HI/AVH and pain. A: Support and encouragement offered as needed. Medications administered as prescribed.  R: Patient is safe on unit. Will continue to monitor for safety and stability.

## 2016-04-05 NOTE — Progress Notes (Signed)
[facility] MD Progress Note  04/05/2016 11:15 AM Bradley Cummings  MRN:  [id number] Subjective:  Patient states " I am fine , its not hallucinations , it is paranoia , I am fine , I am better.'   Objective:Bradley Cummings a 25 Cummings, who is single, unemployed , dropped off from [facility] , but is thinking about re enrolling in [place] , has a hx of anxiety,depression , who presented  voluntarilyaccompanied by his father to [facility]  reporting symptoms of anxiety and paranoia.   Patient seen and chart reviewed.Discussed patient with treatment team.  Patient today continues to be seen as withdrawn , paranoid. Pt continues to have limited response to questions asked . Per staff he is withdrawn, isolative , does not go to cafeteria for meals , participation in milieu is limited. He continues to appear paranoid and suspicious. Will continue to support and continue treatment.      Principal Problem: MDD (major depressive disorder), single episode, severe with psychosis (HCC) Diagnosis:   Patient Active Problem List   Diagnosis Date Noted  . MDD (major depressive disorder), single episode, severe with psychosis (HCC) [F32.3] 04/03/2016  . Alcohol use disorder, moderate, dependence (HCC) [F10.20] 04/03/2016  . Cannabis use disorder, mild, abuse [F12.10] 04/03/2016   Total Time spent with patient: 25 minutes  Past Psychiatric History: Please see H&P.   Past Medical History:  Past Medical History:  Diagnosis Date  . Eczema    History reviewed. No pertinent surgical history. Family History:  Family History  Problem Relation Age of Onset  . Mental illness Neg Hx    Family Psychiatric  History: Please see H&P.  Social History: Please see H&P.  History  Alcohol Use  . Yes    Comment: 3-4 40oz beers a day     History  Drug Use No    Social History   Social History  . Marital status: Single    Spouse name: N/A  . Number of children: N/A  . Years of education: N/A   Social History  Main Topics  . Smoking status: Current Every Day Smoker    Packs/day: 0.25    Years: 3.00    Types: Cigarettes  . Smokeless tobacco: Never Used  . Alcohol use Yes     Comment: 3-4 40oz beers a day  . Drug use: No  . Sexual activity: No   Other Topics Concern  . None   Social History Narrative  . None   Additional Social History:                         Sleep: Fair  Appetite:  Fair  Current Medications: Current Facility-Administered Medications  Medication Dose Route Frequency Provider Last Rate Last Dose  . acetaminophen (TYLENOL) tablet 650 mg  650 mg Oral Q6H PRN Jackelyn Poling, NP      . alum & mag hydroxide-simeth (MAALOX/MYLANTA) 200-200-20 MG/5ML suspension 30 mL  30 mL Oral Q4H PRN Jackelyn Poling, NP      . ARIPiprazole (ABILIFY) tablet 10 mg  10 mg Oral QHS Zaden Sako, MD      . hydrOXYzine (ATARAX/VISTARIL) tablet 25 mg  25 mg Oral TID PRN Jackelyn Poling, NP      . magnesium hydroxide (MILK OF MAGNESIA) suspension 30 mL  30 mL Oral Daily PRN Jackelyn Poling, NP      . OLANZapine (ZYPREXA) tablet 5 mg  5 mg Oral TID PRN Zannie Locastro,  MD       Or  . OLANZapine (ZYPREXA) injection 5 mg  5 mg Intramuscular TID PRN Jomarie LongsSaramma Chaeli Judy, MD      . Melene Muller[START ON 04/06/2016] sertraline (ZOLOFT) tablet 50 mg  50 mg Oral Daily Terik Haughey, MD      . traZODone (DESYREL) tablet 50 mg  50 mg Oral QHS Jomarie LongsSaramma Keontre Defino, MD   50 mg at 04/04/16 2124    Lab Results:  No results found for this or any previous visit (from the past 48 hour(s)).  Blood Alcohol level:  Lab Results  Component Value Date   ETH <5 04/02/2016    Metabolic Disorder Labs: Lab Results  Component Value Date   HGBA1C 5.7 (H) 04/03/2016   MPG 117 04/03/2016   Lab Results  Component Value Date   PROLACTIN 27.6 (H) 04/03/2016   Lab Results  Component Value Date   CHOL 151 04/03/2016   TRIG 72 04/03/2016   HDL 46 04/03/2016   CHOLHDL 3.3 04/03/2016   VLDL 14 04/03/2016   LDLCALC 91 04/03/2016     Physical Findings: AIMS:  , ,  ,  ,    CIWA:    COWS:     Musculoskeletal: Strength & Muscle Tone: within normal limits Gait & Station: normal Patient leans: N/A  Psychiatric Specialty Exam: Physical Exam  Nursing note and vitals reviewed.   Review of Systems  Psychiatric/Behavioral: Positive for hallucinations. The patient is nervous/anxious.   All other systems reviewed and are negative.   Blood pressure 103/75, pulse (!) 102, temperature 97.8 F (36.6 C), temperature source Oral, resp. rate 16, height 5' 10.5" (1.791 m), weight 65.3 kg (144 lb).Body mass index is 20.37 kg/m.  General Appearance: Guarded  Eye Contact:  Poor  Speech:  Slow  Volume:  Decreased  Mood:  Anxious, Depressed and Dysphoric  Affect:  Depressed  Thought Process:  Goal Directed and Descriptions of Associations: Intact  Orientation:  Full (Time, Place, and Person)  Thought Content:  Hallucinations: does not elaborate and Paranoid Ideation has difficulty explaining   Suicidal Thoughts:  No but appears paranoid , delusional - potential danger to self or others  Homicidal Thoughts:  No  Memory:  Immediate;   Fair Recent;   Fair Remote;   Poor  Judgement:  Impaired  Insight:  Shallow  Psychomotor Activity:  Decreased  Concentration:  Concentration: Poor and Attention Span: Poor  Recall:  [employer]Fair  Fund of Knowledge:  Fair  Language:  Fair  Akathisia:  No  Handed:  Right  AIMS (if indicated):     Assets:  Desire for Improvement  ADL's:  Intact  Cognition:  WNL  Sleep:  Number of Hours: 7.5     Treatment Plan Summary:Bradley Cummings a 25 Cummings, who is single, unemployed , [facility] [place] , but [place] in [place] , has a hx of anxiety,depression , who presented  voluntarilyac[facility]ied by his father [facility]  reporting symptoms of anxiety and paranoia.    Patient today continues to present as delusional and  paranoid - hence will continue to readjust  medications.  Will continue today 04/05/16  plan as below except where it is noted.  Daily contact with patient to assess and evaluate symptoms and progress in treatment and Medication management Will increase  Abilify to 10 mg po qhs for psychosis/mood sx. Will increase Zoloft to 50  mg po daily for affective sx. Will continue  PRN medications as per  agitation protocol. Will continue to monitor vitals ,medication compliance and treatment side effects while patient is here.  Will monitor for medical issues as well as call consult as needed.  Reviewed labs cbc - wnl, cmp - wnl, uds - negative, tsh - wnl, BAL;<5, UA - WNL , I have reviewed  TSH- wnl , lipid panel- wnl  hba1c- wnl , pl- 27.6 ( will need to be monitored ) . I have reviewed  ekg for qtc- wnl . Will obtain medical records from [facility].CSW is working on the same. CSW will continue working on disposition.  Patient to participate in therapeutic milieu .    Latoyia Tecson, MD 04/05/2016, 11:15 AM

## 2016-04-05 NOTE — Progress Notes (Signed)
Adult Psychoeducational Group Note  Date:  04/05/2016 Time:  11:50 PM  Group Topic/Focus:  Wrap-Up Group:   The focus of this group is to help patients review their daily goal of treatment and discuss progress on daily workbooks.   Participation Level:  Did Not Attend  Participation Quality:  did not attend  Affect:  did not attend  Cognitive:  did not attend  Insight: None  Engagement in Group:  did not attend  Modes of Intervention:  did not attend  Additional Comments:   Doristine JohnsBrooks, Macio Kissoon Laverne 04/05/2016, 11:50 PM

## 2016-04-05 NOTE — [facility] Group Notes (Signed)
[facility] Group Notes: (Clinical Social Work)   04/05/2016      Type of Therapy:  Group Therapy   Participation Level:  Did Not Attend despite MHT prompting   Bradley MantleMareida Grossman-Orr, LCSW 04/05/2016, 12:29 PM

## 2016-04-05 NOTE — [facility] Group Notes (Signed)
[facility] Group Notes:  (Nursing)  Date:  04/05/2016  Time:  10:42 AM  Type of Therapy:  Nurse Education  Participation Level:  Active  Participation Quality:  Appropriate  Affect:  Appropriate  Cognitive:  Appropriate and Oriented  Insight:  Appropriate  Engagement in Group:  Engaged  Modes of Intervention:  Discussion  Summary of Progress/Problems: Group focussed on Health Support Systems and how to improve the quality of interactions with supportive people.   Elouise Divelbiss M Kiarra Kidd 04/05/2016, 10:42 AM  

## 2016-04-05 NOTE — Plan of Care (Signed)
Problem: Education: Goal: Will be free of psychotic symptoms Outcome: Not Progressing Pt isolates in room most of the evening. Pt appeared to be responding to internal stimuli.

## 2016-04-06 NOTE — Progress Notes (Signed)
Recreation Therapy Notes  Date: 04/06/16 Time: 1000 Location: [address] Dayroom       Group Topic: Communication, Team Building, Problem Solving  Goal Area(s) Addresses:  Patient will effectively work with peer towards shared goal.  Patient will identify skills used to make activity successful.  Patient will identify how skills used during activity can be used to reach post d/c goals.   Intervention: STEM Activity  Activity: Stage managerLanding Pad. In teams patients were given 12 plastic drinking straws and a length of masking tape. Using the materials provided patients were asked to build a landing pad to catch a golf ball dropped from approximately 6 feet in the air.   Education: Pharmacist, communityocial Skills, Discharge Planning   Education Outcome: Acknowledges education/In group clarification offered/Needs additional education.   Clinical Observations/Feedback: Pt did not attend group.   Caroll RancherMarjette Jceon Alverio, LRT/CTRS         Caroll RancherLindsay, Torrell Krutz A 04/06/2016 11:58 AM

## 2016-04-06 NOTE — Progress Notes (Signed)
  D: When asked about his day pt stated, "doing good. Everything is doing good". When asked if he was hearing/seeing things that he knows isn't there pt stated, "everything going good now". Pt had poor eye contact, avoiding eye contact with the Clinical research associatewriter. Pt has no questions or concerns.    A:  Support and encouragement was offered. 15 min checks continued for safety.  R: Pt remains safe.

## 2016-04-06 NOTE — Progress Notes (Signed)
[facility]  04/06/2016 1:17 PM Bradley Cummings  MRN:  [id number]   Subjective:  Patient states he is ok.  "No problems."  Objective:  Keatyn Cummings a 25 y.o.AAmale, who is single, unemployed , [facility] [place] , but is thinking about re enrolling in [place] , has a hx of anxiety,depression , who [facility] voluntarilyac[facility]ied by [facility]  reporting symptoms of anxiety and paranoia.   Patient seen and chart reviewed.Discussed patient with treatment team.  Patient today forwards little.  Per nursing, is withdrawn.  Does take his meds but appears paranoid, "as if looking behind him if someone is there."   He is  Isolative, does not attend groups and stays in room for his meals. Will continue to support and continue treatment.  Principal Problem: MDD (major depressive disorder), single episode, severe with psychosis (HCC) Diagnosis:   Patient Active Problem List   Diagnosis Date Noted  . MDD (major depressive disorder), single episode, severe with psychosis (HCC) [F32.3] 04/03/2016  . Alcohol use disorder, moderate, dependence (HCC) [F10.20] 04/03/2016  . Cannabis use disorder, mild, abuse [F12.10] 04/03/2016   Total Time spent with patient: 25 minutes  Past Psychiatric History: Please see H&P.   Past Medical History:  Past Medical History:  Diagnosis Date  . Eczema    History reviewed. No pertinent surgical history. Family History:  Family History  Problem Relation Age of Onset  . Mental illness Neg Hx    Family Psychiatric  History: Please see H&P.  Social History: Please see H&P.  History  Alcohol Use  . Yes    Comment: 3-4 40oz beers a day     History  Drug Use No    Social History   Social History  . Marital status: Single    Spouse name: N/A  . Number of children: N/A  . Years of education: N/A   Social History Main Topics  . Smoking status: Current Every Day Smoker    Packs/day: 0.25    Years: 3.00    Types: Cigarettes  .  Smokeless tobacco: Never Used  . Alcohol use Yes     Comment: 3-4 40oz beers a day  . Drug use: No  . Sexual activity: No   Other Topics Concern  . None   Social History Narrative  . None   Additional Social History:       Sleep: Fair  Appetite:  Fair  Current Medications: Current Facility-Administered Medications  Medication Dose Route Frequency Provider Last Rate Last Dose  . acetaminophen (TYLENOL) tablet 650 mg  650 mg Oral Q6H PRN Jackelyn PolingJason A Berry, NP      . alum & mag hydroxide-simeth (MAALOX/MYLANTA) 200-200-20 MG/5ML suspension 30 mL  30 mL Oral Q4H PRN Jackelyn PolingJason A Berry, NP      . ARIPiprazole (ABILIFY) tablet 10 mg  10 mg Oral QHS Jomarie LongsSaramma Eappen, MD   10 mg at 04/05/16 2134  . hydrOXYzine (ATARAX/VISTARIL) tablet 25 mg  25 mg Oral TID PRN Jackelyn PolingJason A Berry, NP      . magnesium hydroxide (MILK OF MAGNESIA) suspension 30 mL  30 mL Oral Daily PRN Jackelyn PolingJason A Berry, NP      . OLANZapine (ZYPREXA) tablet 5 mg  5 mg Oral TID PRN Jomarie LongsSaramma Eappen, MD       Or  . OLANZapine (ZYPREXA) injection 5 mg  5 mg Intramuscular TID PRN Jomarie LongsSaramma Eappen, MD      . sertraline (ZOLOFT) tablet 50 mg  50 mg Oral Daily Jomarie LongsSaramma Eappen, MD   50 mg at 04/06/16 0820  . traZODone (DESYREL) tablet 50 mg  50 mg Oral QHS Jomarie LongsSaramma Eappen, MD   50 mg at 04/05/16 2134    Lab Results:  No results found for this or any previous visit (from the past 48 hour(s)).  Blood Alcohol level:  Lab Results  Component Value Date   ETH <5 04/02/2016    Metabolic Disorder Labs: Lab Results  Component Value Date   HGBA1C 5.7 (H) 04/03/2016   MPG 117 04/03/2016   Lab Results  Component Value Date   PROLACTIN 27.6 (H) 04/03/2016   Lab Results  Component Value Date   CHOL 151 04/03/2016   TRIG 72 04/03/2016   HDL 46 04/03/2016   CHOLHDL 3.3 04/03/2016   VLDL 14 04/03/2016   LDLCALC 91 04/03/2016    Physical Findings: AIMS:  , ,  ,  ,    CIWA:    COWS:     Musculoskeletal: Strength & Muscle Tone: within normal  limits Gait & Station: normal Patient leans: N/A  Psychiatric Specialty Exam: Physical Exam  Nursing note and vitals reviewed. Psychiatric: He has a normal mood and affect. His behavior is normal. Judgment and thought content normal.    Review of Systems  Psychiatric/Behavioral: Positive for hallucinations. The patient is nervous/anxious.   All other systems reviewed and are negative.   Blood pressure 110/70, pulse 90, temperature 98.3 F (36.8 C), resp. rate 18, height 5' 10.5" (1.791 m), weight 65.3 kg (144 lb).Body mass index is 20.37 kg/m.  General Appearance: Guarded  Eye Contact:  Poor  Speech:  Slow  Volume:  Decreased  Mood:  Anxious, Depressed and Dysphoric  Affect:  Depressed  Thought Process:  Goal Directed and Descriptions of Associations: Intact  Orientation:  Full (Time, Place, and Person)  Thought Content:  Hallucinations: does not elaborate and Paranoid Ideation has difficulty explaining   Suicidal Thoughts:  No but appears paranoid , delusional - potential danger to self or others  Homicidal Thoughts:  No  Memory:  Immediate;   Fair Recent;   Fair Remote;   Poor  Judgement:  Impaired  Insight:  Shallow  Psychomotor Activity:  Decreased  Concentration:  Concentration: Poor and Attention Span: Poor  Recall:  [employer]is[employer]Fair  Fund of Knowledge:  Fair  Language:  Fair  Akathisia:  No  Handed:  Right  AIMS (if indicated):     Assets:  Desire for Improvement  ADL's:  Intact  Cognition:  WNL  Sleep:  Number of Hours: 7.5   Treatment Plan Summary:Bradley Cummings a 25 y.o.AAmale, who is single, unemployed , [facility] [place] , but [place] , has a hx of anxiety,depression , who presented  [facility]ac[facility]  reporting symptoms of anxiety and paranoia.   Patient today continues to present as delusional and  paranoid - hence will continue to readjust medications.  Daily contact with patient to assess and  evaluate symptoms and progress in treatment and Medication management Will increase  Abilify to 10 mg po qhs for psychosis/mood sx. Will increase Zoloft to 50  mg po daily for affective sx. Will continue  PRN medications as per agitation protocol. Will continue to monitor vitals ,medication compliance and treatment side effects while patient is here.  Will monitor for medical issues as well as call consult as needed.  Reviewed labs cbc - wnl, cmp - wnl,  uds - negative, tsh - wnl, BAL;<5, UA - WNL , I have reviewed  TSH- wnl , lipid panel- wnl  hba1c- wnl , pl- 27.6 ( will need to be monitored ) . I have reviewed  ekg for qtc- wnl . CSW will continue working on disposition.  Patient to participate in therapeutic milieu .   Lindwood Qua, NP [facility] 04/06/2016, 1:17 PM   Agree with NP Progress Note as above

## 2016-04-06 NOTE — Progress Notes (Signed)
Did not attend group 

## 2016-04-06 NOTE — [facility] Group Notes (Signed)
[facility] LCSW Group Therapy  04/06/2016 1:15 pm  Type of Therapy: Process Group Therapy  Participation Level:  Active  Participation Quality:  Appropriate  Affect:  Flat  Cognitive:  Oriented  Insight:  Improving  Engagement in Group:  Limited  Engagement in Therapy:  Limited  Modes of Intervention:  Activity, Clarification, Education, Problem-solving and Support  Summary of Progress/Problems: Today's group addressed the issue of overcoming obstacles.  Patients were asked to identify their biggest obstacle post d/c that stands in the way of their on-going success, and then problem solve as to how to manage this. Invited.  Chose to not attend.  Bradley Geraldorth, Bradley Cummings 04/06/2016   3:15 PM

## 2016-04-06 NOTE — Progress Notes (Signed)
D: Patient denies SI/HI and A/V hallucinations;  A: Monitored q 15 minutes; patient encouraged to attend groups; patient educated about medications; patient given medications per physician orders; patient encouraged to express feelings and/or concerns  R: Patient stays in his room other than attending meals and coming to get his medications; patient is pleasant but appears to be very suspicious; patient constantly was looking behind him at the window; patient does not make eye contact; patient also looked in the dayroom and step forward but then stepped back and walked away; patient's interaction with staff and peers is very isolative; patient was able to set goal to talk with staff 1:1 when having feelings of SI; patient is taking medications as prescribed and tolerating medications; patient is not attending any groups

## 2016-04-06 NOTE — Progress Notes (Signed)
Recreation Therapy Notes  INPATIENT RECREATION THERAPY ASSESSMENT  Patient Details Name: Bradley Cummings MRN: [id number] DOB: [birth year] Today's Date: 04/06/2016  Patient Stressors:  (I have no stressors.)  Pt stated he was here for mental help and because he was paranoid at home.  Coping Skills:   Isolate, Avoidance, Exercise, Music  Personal Challenges: Communication, Concentration, Expressing Yourself, Problem-Solving, Social Interaction, Stress Management, Time Management, Work Nutritional therapisterformance  Leisure Interests (2+):  Individual - TV, Individual - Other (Comment) (Sleep)  Awareness of Community Resources:  Yes  Community Resources:  Other (Comment) Control and instrumentation engineer(Basketball court)  Current Use: No  If no, Barriers?: Other (Comment) (Lazy)  Patient Strengths:  Keep motivated, giving my all  Patient Identified Areas of Improvement:  Doing too much, not thinking things through  Current Recreation Participation:  "Once in a blue moon"  Patient Goal for Hospitalization:  "Get self motivated to look for a job when I go back home"  [place]  [place]idence[place]   Current [place] (including self-harm):  No  Current HI:  No  Consent to Intern Participation: N/A   Caroll RancherMarjette Da Authement, LRT/CTRS  Caroll RancherLindsay, Zandon Talton A 04/06/2016, 3:31 PM

## 2016-04-07 MED ORDER — SERTRALINE HCL 100 MG PO TABS
100.0000 mg | ORAL_TABLET | Freq: Every day | ORAL | Status: DC
  Administered 2016-04-08 – 2016-04-09 (×2): 100 mg via ORAL
  Filled 2016-04-07 (×4): qty 1

## 2016-04-07 MED ORDER — ARIPIPRAZOLE 15 MG PO TABS
15.0000 mg | ORAL_TABLET | Freq: Every day | ORAL | Status: DC
  Administered 2016-04-07 – 2016-04-08 (×2): 15 mg via ORAL
  Filled 2016-04-07 (×4): qty 1

## 2016-04-07 NOTE — Progress Notes (Signed)
   D: Pt quickly answers questions from the writer with "I'm ok". When asked if he had any questions or concerns that hadn't been addressed. Pt stated, "I'm ok". Pt then stated, "I'm just trying to get better". Pt has no questions or concerns.    A: Writer encouraged pt to continue taking his meds and attending the groups.  Support and encouragement was offered. 15 min checks continued for safety.  R: Pt remains safe.

## 2016-04-07 NOTE — [facility] Group Notes (Signed)
[facility] LCSW Group Therapy  04/07/2016 3:16 PM   Type of Therapy:  Group Therapy  Participation Level:  Active  Participation Quality:  Attentive  Affect:  Appropriate  Cognitive:  Appropriate  Insight:  Improving  Engagement in Therapy:  Engaged  Modes of Intervention:  Clarification, Education, Exploration and Socialization  Summary of Progress/Problems: Today's group focused on resilience. Stayed the entire time, minimal engagement.  However, did not leave, and was willing to answer direct questions.  Stated he [place] he was about 7 or 8, and had to learn a new language and a new culture.  He got himself through that, as he is getting himself through hospitalization now, telling himself that "i just need to push through this-it will be better on the other side."  He learned this from his parents. Daryel Geraldorth, Roxi Hlavaty B 04/07/2016 , 3:16 PM

## 2016-04-07 NOTE — Progress Notes (Signed)
Recreation Therapy Notes  Animal-Assisted Activity (AAA) Program Checklist/Progress Notes Patient Eligibility Criteria Checklist & Daily Group note for Rec Tx Intervention  Date: 11.07.2017 Time: 2:45pm Location: [address]    AAA/T Program Assumption of Risk Form signed by Patient/ or Parent Legal Guardian Yes  Patient is free of allergies or sever asthma Yes  Patient reports no fear of animals Yes  Patient reports no history of cruelty to animals Yes  Patient understands his/her participation is voluntary Yes  Behavioral Response: Did not attend.    Clinical Observations/Feedback: Patient discussed with MD for appropriateness in pet therapy session. Both LRT and MD agree patient is appropriate for participation. Patient offered participation in session, patient politely declined invitation at this time.   Bradley Cummings, LRT/CTRS         Bradley Cummings 04/07/2016 3:06 PM

## 2016-04-07 NOTE — Progress Notes (Signed)
[facility] MD Progress Note  04/07/2016 2:09 PM Bradley Cummings  MRN:  [id number] Subjective:  Patient states " I am fine.'    Objective:Bradley Cummings a 24 y.o.AAmale, who is single, unemployed , dropped off from [facility] , but is thinking about re enrolling in [place] , has a hx of anxiety,depression , who presented  voluntarilyaccompanied by his father to [facility]  reporting symptoms of anxiety and paranoia.   Patient seen and chart reviewed.Discussed patient with treatment team.  Patient today is seen in bed, poor eye contact , is paranoid , suspicious , limited interaction in milieu. Pt continues to be anxious around peers and requires redirection. Pt per staff has been taking his medications and denies ADRs. I was able to obtain records from his previous hospital admission at [facility] - dated 05/04/2012 - Patient at that times admitted with new onset psychosis - and cannabis abuse - as per his diagnosis - his psychosis at that time was thought to be likely secondary to his cannabis abuse. Pt was discharged on risperidone and depakote. Will continue to support and continue treatment.      Principal Problem: MDD (major depressive disorder), single episode, severe with psychosis (HCC) Diagnosis:   Patient Active Problem List   Diagnosis Date Noted  . MDD (major depressive disorder), single episode, severe with psychosis (HCC) [F32.3] 04/03/2016  . Alcohol use disorder, moderate, dependence (HCC) [F10.20] 04/03/2016  . Cannabis use disorder, mild, abuse [F12.10] 04/03/2016   Total Time spent with patient: 25 minutes  Past Psychiatric History: Please see H&P.   Past Medical History:  Past Medical History:  Diagnosis Date  . Eczema    History reviewed. No pertinent surgical history. Family History:  Family History  Problem Relation Age of Onset  . Mental illness Neg Hx    Family Psychiatric  History: Please see H&P.  Social History: Please see H&P.  History  Alcohol Use  . Yes   Comment: 3-4 40oz beers a day     History  Drug Use No    Social History   Social History  . Marital status: Single    Spouse name: N/A  . Number of children: N/A  . Years of education: N/A   Social History Main Topics  . Smoking status: Current Every Day Smoker    Packs/day: 0.25    Years: 3.00    Types: Cigarettes  . Smokeless tobacco: Never Used  . Alcohol use Yes     Comment: 3-4 40oz beers a day  . Drug use: No  . Sexual activity: No   Other Topics Concern  . None   Social History Narrative  . None   Additional Social History:                         Sleep: Fair  Appetite:  Fair  Current Medications: Current Facility-Administered Medications  Medication Dose Route Frequency Provider Last Rate Last Dose  . acetaminophen (TYLENOL) tablet 650 mg  650 mg Oral Q6H PRN Jackelyn Poling, NP      . alum & mag hydroxide-simeth (MAALOX/MYLANTA) 200-200-20 MG/5ML suspension 30 mL  30 mL Oral Q4H PRN Jackelyn Poling, NP      . ARIPiprazole (ABILIFY) tablet 15 mg  15 mg Oral QHS Ancel Easler, MD      . hydrOXYzine (ATARAX/VISTARIL) tablet 25 mg  25 mg Oral TID PRN Jackelyn Poling, NP      . magnesium hydroxide (MILK  OF MAGNESIA) suspension 30 mL  30 mL Oral Daily PRN Jackelyn PolingJason A Berry, NP      . OLANZapine (ZYPREXA) tablet 5 mg  5 mg Oral TID PRN Jomarie LongsSaramma Mell Guia, MD       Or  . OLANZapine (ZYPREXA) injection 5 mg  5 mg Intramuscular TID PRN Jomarie LongsSaramma Trenice Mesa, MD      . Melene Muller[START ON 04/08/2016] sertraline (ZOLOFT) tablet 100 mg  100 mg Oral Daily Alexas Basulto, MD      . traZODone (DESYREL) tablet 50 mg  50 mg Oral QHS Jomarie LongsSaramma Deicy Rusk, MD   50 mg at 04/06/16 2128    Lab Results:  No results found for this or any previous visit (from the past 48 hour(s)).  Blood Alcohol level:  Lab Results  Component Value Date   ETH <5 04/02/2016    Metabolic Disorder Labs: Lab Results  Component Value Date   HGBA1C 5.7 (H) 04/03/2016   MPG 117 04/03/2016   Lab Results  Component  Value Date   PROLACTIN 27.6 (H) 04/03/2016   Lab Results  Component Value Date   CHOL 151 04/03/2016   TRIG 72 04/03/2016   HDL 46 04/03/2016   CHOLHDL 3.3 04/03/2016   VLDL 14 04/03/2016   LDLCALC 91 04/03/2016    Physical Findings: AIMS:  , ,  ,  ,    CIWA:    COWS:     Musculoskeletal: Strength & Muscle Tone: within normal limits Gait & Station: normal Patient leans: N/A  Psychiatric Specialty Exam: Physical Exam  Nursing note and vitals reviewed.   Review of Systems  Psychiatric/Behavioral: Positive for hallucinations. The patient is nervous/anxious.   All other systems reviewed and are negative.   Blood pressure 106/68, pulse 92, temperature 98.3 F (36.8 C), temperature source Oral, resp. rate 18, height 5' 10.5" (1.791 m), weight 65.3 kg (144 lb).Body mass index is 20.37 kg/m.  General Appearance: Guarded  Eye Contact:  Poor  Speech:  Slow  Volume:  Decreased  Mood:  Anxious, Depressed and Dysphoric  Affect:  Depressed  Thought Process:  Goal Directed and Descriptions of Associations: Intact  Orientation:  Full (Time, Place, and Person)  Thought Content:  Hallucinations: does not elaborate and Paranoid Ideation has difficulty explaining - states it comes and goes   Suicidal Thoughts:  No but appears paranoid , delusional - potential danger to self or others  Homicidal Thoughts:  No  Memory:  Immediate;   Fair Recent;   Fair Remote;   Poor  Judgement:  Impaired  Insight:  Shallow  Psychomotor Activity:  Decreased  Concentration:  Concentration: Fair and Attention Span: Fair  Recall:  FiservFair  Fund of Knowledge:  Fair  Language:  Fair  Akathisia:  No  Handed:  Right  AIMS (if indicated):     Assets:  Desire for Improvement  ADL's:  Intact  Cognition:  WNL  Sleep:  Number of Hours: 6.75     Treatment Plan Summary:Bradley Cummings a 24 y.o.AAmale, who is single, unemployed , [facility] [place] , but is thinking about [place] en[place] in [place] ,  has a hx of anxiety,depression , who presented  voluntarilyac[facility]ied by his father [facility]  reporting symptoms of anxiety and paranoia.    Patient today continues to present as withdrawn mostly , and appears delusional and  paranoid - hence will continue to readjust medications. [facility]  Will continue today 04/07/16  plan as below except  where it is noted.  Daily contact with patient to assess and evaluate symptoms and progress in treatment and Medication management Will increase  Abilify to 15 mg po qhs for psychosis/mood sx. Will increase Zoloft to 100  mg po daily for affective sx. Will continue  PRN medications as per agitation protocol. Will continue to monitor vitals ,medication compliance and treatment side effects while patient is here.  Will monitor for medical issues as well as call consult as needed.  Reviewed labs cbc - wnl, cmp - wnl, uds - negative, tsh - wnl, BAL;<5, UA - WNL , I have reviewed  TSH- wnl , lipid panel- wnl  hba1c- wnl , pl- 27.6 ( will need to be monitored ) . I have reviewed  ekg for qtc- wnl . CSW will continue working on disposition.  Patient to participate in therapeutic milieu .    Koreena Joost, MD 04/07/2016, 2:09 PM

## 2016-04-07 NOTE — Progress Notes (Signed)
Recreation Therapy Notes  Date: 04/07/16 Time: 1000 Location: 500 Hall Dayroom  Group Topic: Anger Management  Goal Area(s) Addresses:  Patient will identify triggers for anger.  Patient will identify physical reaction to anger.   Patient will identify benefit of using coping skills when angry.  Intervention: Pencils, worksheet  Activity: Anger Umbrella.  Patients were to identify the feelings that are covered up by their anger and write them inside of the umbrella.  After patients identified the feelings, they were to identify coping skills to deal with those feelings a place them on the outside of umbrella.   Education: Anger Management, Discharge Planning   Education Outcome: Acknowledges education/In group clarification offered/Needs additional education.   Clinical Observations/Feedback:  Pt did not attend group.   Caroll RancherMarjette Sylva Overley, LRT/CTRS          Caroll RancherLindsay, Taylon Coole A 04/07/2016 12:39 PM

## 2016-04-07 NOTE — Progress Notes (Signed)
Adult Psychoeducational Group Note  Date:  04/07/2016 Time:  8:29 PM  Group Topic/Focus:  Wrap-Up Group:   The focus of this group is to help patients review their daily goal of treatment and discuss progress on daily workbooks.   Participation Level:  Active  Participation Quality:  Appropriate  Affect:  Appropriate  Cognitive:  Appropriate  Insight: Appropriate  Engagement in Group:  Engaged  Modes of Intervention:  Discussion  Additional Comments:  The patient expressed that he attended all groups.The patient also said that he rates his day a 6. Octavio Mannshigpen, Leilani Cespedes Lee 04/07/2016, 8:29 PM

## 2016-04-07 NOTE — Progress Notes (Signed)
DAR NOTE: Patient presents with flat affect and depressed mood. Pt has been observed seated in the dayroom but not interacting with anyone. Pt did not go to the cafeteria for lunch or dinner. Denies pain, auditory and visual hallucinations.  Rates depression at 6, hopelessness at 5, and anxiety at 6.  Maintained on routine safety checks.  Medications given as prescribed.  Support and encouragement offered as needed. We will continue to monitor.

## 2016-04-07 NOTE — [facility] Group Notes (Signed)
[facility] Group Notes:  (Nursing/MHT/Case Management/Adjunct)  Date:  04/07/2016  Time:  0915  Type of Therapy:  Nurse Education  Participation Level:  Did Not Attend  Summary of Progress/Problems: Pt was invited. Pt did not attend.  Maurine SimmeringShugart, Magdiel Bartles M 04/07/2016, 10:02 AM

## 2016-04-08 NOTE — Progress Notes (Signed)
Recreation Therapy Notes  Date: 04/08/16 Time: 1000 Location: 500 Hall Dayroom  Group Topic: Leisure Education  Goal Area(s) Addresses:  Patient will identify positive leisure activities.  Patient will identify one positive benefit of participation in leisure activities.   Behavioral Response:  Minimal  Intervention: Dry erase board, dry erase marker, eraser, various activities in a can  Activity: Leisure Pictionary.  LRT introduced to concept of leisure.  Patients were to pick a strip of paper from the can with an activity on it.  Patients were to then draw the a picture of the activity on the board.  The remainder of the group had to try a guess what was being drawn.  The person that guesses correctly, would get the next turn.  Education:  Leisure Education, Building control surveyorDischarge Planning  Education Outcome: Acknowledges education/In group clarification offered/Needs additional education  Clinical Observations/Feedback: Pt participated with prompting, otherwise pt was quiet and observant.    Caroll RancherMarjette Gleason Ardoin, LRT/CTRS       Lillia AbedLindsay, Affie Gasner A 04/08/2016 12:12 PM

## 2016-04-08 NOTE — Progress Notes (Signed)
D: Chanc[place] remains mostly isolative to his room. He's very flat and appears depressed. He denies needs and concerns. He forwards little when questioned, but denies SI/AVH. He did leave the unit for lunch, which is an improvement, and he has attended some groups.  A: Meds given as ordered. No PRNs given or requested. Q15 safety checks maintained. Support/encouragement offered. R: Pt remains free from harm and continues with treatment. Will continue to monitor for needs/safety.

## 2016-04-08 NOTE — Plan of Care (Signed)
Problem: Activity: Goal: Interest or engagement in activities will improve Pt is now leaving unit for lunch, whereas he had been staying behind due to reluctance to engage.

## 2016-04-08 NOTE — Progress Notes (Signed)
[facility]  04/08/2016 2:32 PM Bradley Cummings  MRN:  [id number] Subjective:  Patient states " I am ok."    Objective:Bradley Cummings a 25 y.o.AAmale, who is single, unemployed , [facility] [employer] [facility] [place] , but is thinking about [place] in [place] , has a hx of anxiety,depression , who presented  voluntarilyac[facility]ied by his father [facility]  reporting symptoms of anxiety and paranoia.   Patient seen and chart reviewed.Discussed patient with treatment team.  Patient today is seen as awake, alert , he appears less paranoid , his interaction in milieu is improving. Pt per staff has been taking his medications and denies ADRs. Continue to offer encouragement and support.       Principal Problem: MDD (major depressive disorder), single episode, severe with psychosis (HCC) Diagnosis:   Patient Active Problem List   Diagnosis Date Noted  . MDD (major depressive disorder), single episode, severe with psychosis (HCC) [F32.3] 04/03/2016  . Alcohol use disorder, moderate, dependence (HCC) [F10.20] 04/03/2016  . Cannabis use disorder, mild, abuse [F12.10] 04/03/2016   Total Time spent with patient: 25 minutes  Past Psychiatric History: Please see H&P.   Past Medical History:  Past Medical History:  Diagnosis Date  . Eczema    History reviewed. No pertinent surgical history. Family History:  Family History  Problem Relation Age of Onset  . Mental illness Neg Hx    Family Psychiatric  History: Please see H&P.  Social History: Please see H&P.  History  Alcohol Use  . Yes    Comment: 3-4 40oz beers a day     History  Drug Use No    Social History   Social History  . Marital status: Single    Spouse name: N/A  . Number of children: N/A  . Years of education: N/A   Social History Main Topics  . Smoking status: Current Every Day Smoker    Packs/day: 0.25    Years: 3.00    Types: Cigarettes  . Smokeless tobacco: Never Used  . Alcohol use Yes     Comment:  3-4 40oz beers a day  . Drug use: No  . Sexual activity: No   Other Topics Concern  . None   Social History Narrative  . None   Additional Social History:                         Sleep: Fair  Appetite:  Fair  Current Medications: Current Facility-Administered Medications  Medication Dose Route Frequency Provider Last Rate Last Dose  . acetaminophen (TYLENOL) tablet 650 mg  650 mg Oral Q6H PRN Jackelyn PolingJason A Berry, NP      . alum & mag hydroxide-simeth (MAALOX/MYLANTA) 200-200-20 MG/5ML suspension 30 mL  30 mL Oral Q4H PRN Jackelyn PolingJason A Berry, NP      . ARIPiprazole (ABILIFY) tablet 15 mg  15 mg Oral QHS Jomarie LongsSaramma Syon Tews, MD   15 mg at 04/07/16 2132  . hydrOXYzine (ATARAX/VISTARIL) tablet 25 mg  25 mg Oral TID PRN Jackelyn PolingJason A Berry, NP      . magnesium hydroxide (MILK OF MAGNESIA) suspension 30 mL  30 mL Oral Daily PRN Jackelyn PolingJason A Berry, NP      . OLANZapine (ZYPREXA) tablet 5 mg  5 mg Oral TID PRN Jomarie LongsSaramma Takeo Harts, MD       Or  . OLANZapine (ZYPREXA) injection 5 mg  5 mg Intramuscular TID PRN Jomarie LongsSaramma Kaidyn Hernandes, MD      .  sertraline (ZOLOFT) tablet 100 mg  100 mg Oral Daily Jomarie LongsSaramma  Carmack, MD   100 mg at 04/08/16 0815  . traZODone (DESYREL) tablet 50 mg  50 mg Oral QHS Jomarie LongsSaramma Melitta Tigue, MD   50 mg at 04/07/16 2132    Lab Results:  No results found for this or any previous visit (from the past 48 hour(s)).  Blood Alcohol level:  Lab Results  Component Value Date   ETH <5 04/02/2016    Metabolic Disorder Labs: Lab Results  Component Value Date   HGBA1C 5.7 (H) 04/03/2016   MPG 117 04/03/2016   Lab Results  Component Value Date   PROLACTIN 27.6 (H) 04/03/2016   Lab Results  Component Value Date   CHOL 151 04/03/2016   TRIG 72 04/03/2016   HDL 46 04/03/2016   CHOLHDL 3.3 04/03/2016   VLDL 14 04/03/2016   LDLCALC 91 04/03/2016    Physical Findings: AIMS:  , ,  ,  ,    CIWA:    COWS:     Musculoskeletal: Strength & Muscle Tone: within normal limits Gait & Station:  normal Patient leans: N/A  Psychiatric Specialty Exam: Physical Exam  Nursing note and vitals reviewed.   Review of Systems  Psychiatric/Behavioral: The patient is nervous/anxious.   All other systems reviewed and are negative.   Blood pressure 121/77, pulse 78, temperature 97.9 F (36.6 C), temperature source Oral, resp. rate 20, height 5' 10.5" (1.791 m), weight 65.3 kg (144 lb).Body mass index is 20.37 kg/m.  General Appearance: Guarded  Eye Contact:  Fair  Speech:  Normal Rate  Volume:  Decreased  Mood:  Depressed improving  Affect:  Congruent  Thought Process:  Goal Directed and Descriptions of Associations: Intact  Orientation:  Full (Time, Place, and Person)  Thought Content:  Hallucinations: does not elaborate and Paranoid Ideation improving- [facility] - but states he is still paranoid when he is in group settings , attempts to cope with it.  Suicidal Thoughts:  No   Homicidal Thoughts:  No  Memory:  Immediate;   Fair Recent;   Fair Remote;   Fair  Judgement:  Fair  Insight:  Shallow  Psychomotor Activity:  Decreased  Concentration:  Concentration: Fair and Attention Span: Fair  Recall:  FiservFair  Fund of Knowledge:  Fair  Language:  Fair  Akathisia:  No  Handed:  Right  AIMS (if indicated):     Assets:  Desire for Improvement  ADL's:  Intact  Cognition:  WNL  Sleep:  Number of Hours: 5.25    04/07/16  I was able to obtain records from [facility] - Patient at that times admitted with new onset psychosis - and cannabis abuse - as per his diagnosis - his psychosis at that time was thought to be likely secondary to his cannabis abuse. Pt was discharged on risperidone and depakote.   Treatment Plan Summary:Bradley Shol25 y.o.AAmale, who is single, unemployed , [facility] [place] , but [place] , has a hx of anxiety,depression , who presented  voluntarilyaccompanied  by his father [facility]  reporting symptoms of anxiety and paranoia.    Patient today seen as improving - will continue treatment.  Will continue today 11/8 /17  plan as below except where it is noted.  Daily contact with patient to assess and evaluate symptoms and progress in treatment and Medication management Will continue  Abilify  15 mg po qhs for psychosis/mood sx. Will continue Zoloft to 100  mg po daily for affective sx. Will continue  PRN medications as per agitation protocol. Will continue to monitor vitals ,medication compliance and treatment side effects while patient is here.  Will monitor for medical issues as well as call consult as needed.  Reviewed labs cbc - wnl, cmp - wnl, uds - negative, tsh - wnl, BAL;<5, UA - WNL , I have reviewed  TSH- wnl , lipid panel- wnl  hba1c- wnl , pl- 27.6 ( will need to be monitored ) . I have reviewed  ekg for qtc- wnl . CSW will continue working on disposition.  Patient to participate in therapeutic milieu .    Sargun Rummell, MD 04/08/2016, 2:32 PM

## 2016-04-08 NOTE — [facility] Group Notes (Signed)
[facility] LCSW Group Therapy  04/08/2016 2:43 PM   Type of Therapy:  Group Therapy   Participation Level:  Engaged  Participation Quality:  Attentive  Affect:  Appropriate   Cognitive:  Alert   Insight:  Engaged  Engagement in Therapy:  Improving   Modes of Intervention:  Education, Exploration, Socialization   Summary of Progress/Problems: Invited, chose not to attend.   [name] from [employer] was here to tell his story of recovery, inform patients about [facility] and play his guitar.   Bradley Cummings 04/08/2016 2:43 PM

## 2016-04-08 NOTE — Tx Team (Signed)
Interdisciplinary Treatment and Diagnostic Plan Update  04/08/2016 Time of Session: 10:54 AM  Bradley Cummings MRN: [id number]  Principal Diagnosis: MDD (major depressive disorder), single episode, severe with psychosis (HCC)  Secondary Diagnoses: Principal Problem:   MDD (major depressive disorder), single episode, severe with psychosis (HCC) Active Problems:   Alcohol use disorder, moderate, dependence (HCC)   Cannabis use disorder, mild, abuse   Current Medications:  Current Facility-Administered Medications  Medication Dose Route Frequency Provider Last Rate Last Dose  . acetaminophen (TYLENOL) tablet 650 mg  650 mg Oral Q6H PRN Jackelyn PolingJason A Berry, NP      . alum & mag hydroxide-simeth (MAALOX/MYLANTA) 200-200-20 MG/5ML suspension 30 mL  30 mL Oral Q4H PRN Jackelyn PolingJason A Berry, NP      . ARIPiprazole (ABILIFY) tablet 15 mg  15 mg Oral QHS Jomarie LongsSaramma Eappen, MD   15 mg at 04/07/16 2132  . hydrOXYzine (ATARAX/VISTARIL) tablet 25 mg  25 mg Oral TID PRN Jackelyn PolingJason A Berry, NP      . magnesium hydroxide (MILK OF MAGNESIA) suspension 30 mL  30 mL Oral Daily PRN Jackelyn PolingJason A Berry, NP      . OLANZapine (ZYPREXA) tablet 5 mg  5 mg Oral TID PRN Jomarie LongsSaramma Eappen, MD       Or  . OLANZapine (ZYPREXA) injection 5 mg  5 mg Intramuscular TID PRN Jomarie LongsSaramma Eappen, MD      . sertraline (ZOLOFT) tablet 100 mg  100 mg Oral Daily Saramma Eappen, MD   100 mg at 04/08/16 0815  . traZODone (DESYREL) tablet 50 mg  50 mg Oral QHS Jomarie LongsSaramma Eappen, MD   50 mg at 04/07/16 2132    PTA Medications: Prescriptions Prior to Admission  Medication Sig Dispense Refill Last Dose  . sucralfate (CARAFATE) 1 GM/10ML suspension Take 10 mLs (1 g total) by mouth 4 (four) times daily -  with meals and at bedtime. 420 mL 1 03/29/2016 at Unknown time    Treatment Modalities: Medication Management, Group therapy, Case management,  1 to 1 session with clinician, Psychoeducation, Recreational therapy.   Physician Treatment Plan for Primary Diagnosis: MDD  (major depressive disorder), single episode, severe with psychosis (HCC) Long Term Goal(s): Improvement in symptoms so as ready for discharge  Short Term Goals: Ability to verbalize feelings will improve  Medication Management: Evaluate patient's response, side effects, and tolerance of medication regimen.  Therapeutic Interventions: 1 to 1 sessions, Unit Group sessions and Medication administration.  Evaluation of Outcomes: Adequate for Discharge  Physician Treatment Plan for Secondary Diagnosis: Principal Problem:   MDD (major depressive disorder), single episode, severe with psychosis (HCC) Active Problems:   Alcohol use disorder, moderate, dependence (HCC)   Cannabis use disorder, mild, abuse   Long Term Goal(s): Improvement in symptoms so as ready for discharge  Short Term Goals: Compliance with prescribed medications will improve  Medication Management: Evaluate patient's response, side effects, and tolerance of medication regimen.  Therapeutic Interventions: 1 to 1 sessions, Unit Group sessions and Medication administration.  Evaluation of Outcomes: Adequate for Discharge   RN Treatment Plan for Primary Diagnosis: MDD (major depressive disorder), single episode, severe with psychosis (HCC) Long Term Goal(s): Knowledge of disease and therapeutic regimen to maintain health will improve  Short Term Goals: Ability to verbalize feelings will improve and Compliance with prescribed medications will improve  Medication Management: RN will administer medications as ordered by provider, will assess and evaluate patient's response and provide education to patient for prescribed medication. RN will report any adverse  and/or side effects to prescribing provider.  Therapeutic Interventions: 1 on 1 counseling sessions, Psychoeducation, Medication administration, Evaluate responses to treatment, Monitor vital signs and CBGs as ordered, Perform/monitor CIWA, COWS, AIMS and Fall Risk  screenings as ordered, Perform wound care treatments as ordered.  Evaluation of Outcomes: Adequate for Discharge   LCSW Treatment Plan for Primary Diagnosis: MDD (major depressive disorder), single episode, severe with psychosis (HCC) Long Term Goal(s): Safe transition to appropriate next level of care at discharge, Engage patient in therapeutic group addressing interpersonal concerns.  Short Term Goals: Engage patient in aftercare planning with referrals and resources and Facilitate acceptance of mental health diagnosis and concerns  Therapeutic Interventions: Assess for all discharge needs, 1 to 1 time with Social worker, Explore available resources and support systems, Assess for adequacy in community support network, Educate family and significant other(s) on suicide prevention, Complete Psychosocial Assessment, Interpersonal group therapy.  Evaluation of Outcomes: Adequate for Discharge   Progress in Treatment: Attending groups: Yes Participating in groups: Minimally Taking medication as prescribed: Yes, MD continues to assess for medication changes as needed Toleration medication: Yes, no side effects reported at this time Family/Significant other contact made: Yes Patient understands diagnosis: Limited insight  Discussing patient identified problems/goals with staff: Yes Medical problems stabilized or resolved: Yes Denies suicidal/homicidal ideation: Yes Issues/concerns per patient self-inventory: None Other: N/A  New problem(s) identified: None identified at this time.   New Short Term/Long Term Goal(s): None identified at this time.   Discharge Plan or Barriers: Return home, follow up outpatient   Reason for Continuation of Hospitalization:    Estimated Length of Stay: Likely d/c tomorrow  Attendees: Patient: 04/08/2016  10:54 AM  Physician: Dr. Elna BreslowEappen 04/08/2016  10:54 AM  Nursing: Clydie BraunKaren.S RN 04/08/2016  10:54 AM  RN Care Manager: Onnie BoerJennifer Clark 04/08/2016  10:54 AM   Social Worker: Richelle Itood Kruz Chiu, LCSW 04/08/2016  10:54 AM  Recreational Therapist: Aggie CosierMarjette Lindsey 04/08/2016  10:54 AM  Other: Baldo DaubJolan Williams, Social Work Intern  04/08/2016  10:54 AM  Other:  04/08/2016  10:54 AM  Other: 04/08/2016  10:54 AM    Scribe for Treatment Team: Richelle Itood Allis Quirarte LCSW  04/08/2016 10:54 AM

## 2016-04-09 MED ORDER — TRAZODONE HCL 50 MG PO TABS: 50.0000 mg | ORAL_TABLET | Freq: Every day | ORAL | 0 refills | Status: DC

## 2016-04-09 MED ORDER — ARIPIPRAZOLE 15 MG PO TABS: 15.0000 mg | ORAL_TABLET | Freq: Every day | ORAL | 0 refills | Status: DC

## 2016-04-09 MED ORDER — SERTRALINE HCL 100 MG PO TABS: 100.0000 mg | ORAL_TABLET | Freq: Every day | ORAL | 0 refills | Status: DC

## 2016-04-09 MED ORDER — HYDROXYZINE HCL 25 MG PO TABS: 25.0000 mg | ORAL_TABLET | Freq: Three times a day (TID) | ORAL | 0 refills | Status: DC | PRN

## 2016-04-09 NOTE — [facility] Suicide Risk Assessment (Signed)
[facility]   Principal Problem: MDD (major depressive disorder), single episode, severe with psychosis [facility](HCC) Discharge Diagnoses:  Patient Active Problem List   Diagnosis Date Noted  . MDD (major depressive disorder), single episode, severe with psychosis (HCC) [F32.3] 04/03/2016  . Alcohol use disorder, moderate, dependence (HCC) [F10.20] 04/03/2016  . Cannabis use disorder, mild, abuse [F12.10] 04/03/2016    Total Time spent with patient: 30 minutes  Musculoskeletal: Strength & Muscle Tone: within normal limits Gait & Station: normal Patient leans: N/A  Psychiatric Specialty Exam: Review of Systems  Psychiatric/Behavioral: Positive for substance abuse. Negative for depression, hallucinations and suicidal ideas.  All other systems reviewed and are negative.   Blood pressure 102/73, pulse 86, temperature 98 F (36.7 C), resp. rate 18, height 5' 10.5" (1.791 m), weight 65.3 kg (144 lb).Body mass index is 20.37 kg/m.  General Appearance: Casual  Eye Contact::  Fair  Speech:  Clear and Coherent409  Volume:  Normal  Mood:  Euthymic  Affect:  Appropriate  Thought Process:  Goal Directed and Descriptions of Associations: Intact  Orientation:  Full (Time, Place, and Person)  Thought Content:  Logical  Suicidal Thoughts:  No  Homicidal Thoughts:  No  Memory:  Immediate;   Fair Recent;   Fair Remote;   Fair  Judgement:  Fair  Insight:  Fair  Psychomotor Activity:  Normal  Concentration:  Fair  Recall:  FiservFair  Fund of Knowledge:Fair  Language: Fair  Akathisia:  No  Handed:  Right  AIMS (if indicated):   0  Assets:  Desire for Improvement  Sleep:  Number of Hours: 4.75  Cognition: WNL  ADL's:  Intact   Mental Status Per Nursing Assessment::   On Admission:     Demographic Factors:  Male  Loss Factors: NA  Historical Factors: Impulsivity  Risk Reduction Factors:   Positive social support  Continued Clinical Symptoms:   Alcohol/Substance Abuse/Dependencies Previous Psychiatric Diagnoses and Treatments  Cognitive Features That Contribute To Risk:  None    Suicide Risk:  Minimal: No identifiable suicidal ideation.  Patients presenting with no risk factors but with morbid ruminations; may be classified as minimal risk based on the severity of the depressive symptoms  Follow-up Information    [facility] Follow up on 04/22/2016.   Why:  Wednesday at 9:00 for your intial meeting with Cam to open your case. Then Monday, the 27th at 3:00 with the Doctor Contact information: [address], [place], [place] [address] Telephone: [phone]) [phone]          Plan Of Care/Follow-up recommendations:  Activity:  no restrictions Diet:  regular Other:  as needed  Jeriel Vivanco, MD 04/09/2016, 9:12 AM

## 2016-04-09 NOTE — Progress Notes (Signed)
D: Pt has isolated to his room for majority of the night.  Pt presents with depressed affect and mood.  His goal is to "sleep better."  Pt disclosed to writer that he went to the cafeteria for lunch and dinner.  He was encouraged to go to breakfast.  Pt denies SI/HI, denies hallucinations, denies pain.   A: Introduced self to pt.  Actively listened to pt and offered support and encouragement. Medications administered per order.   R: Pt is safe on the unit.  Pt is compliant with medications.  Pt verbally contracts for safety.  Will continue to monitor and assess.

## 2016-04-09 NOTE — Plan of Care (Signed)
Problem: Medication: Goal: Compliance with prescribed medication regimen will improve Outcome: Progressing Pt has been compliant with medications tonight.    

## 2016-04-09 NOTE — Discharge Summary (Signed)
Physician Discharge Summary Note  Patient:  Bradley Cummings is an 25 y.o., male MRN:  [id number] DOB:  16-Feb-1991 Patient phone:  [phone] (home)  Patient address:   [address] [place] [address],  Total Time spent with patient: 45 minutes  Date of Admission:  04/03/2016 Date of Discharge: 04/09/16  Reason for Admission:   Bradley Cummings a 25 y.o.AAmale, who is single, unemployed , dropped off from [employer] [place] , but [place] , has a hx of anxiety,depression , who presented  voluntarilyaccompanied by his father [facility]  reporting symptoms of anxiety and paranoia.   Per initial notes in EHR: " Pt has a history of substance abuse--alcohol (used to drink 1 case/day) and marijuana.Pt reports compliance with stomach medication, but no psychiatric meds. Pt states that his last drink was 1 week ago, but his dad says that he thought he quit 2 months ago. Pt admits to seeing things like shadows and dad says he talks to himself and seems confused and unable to focus for the past two months since he quit drinking. Pt admits to withdrawing from his friends and not enjoying playing basketball, etc like he used to.Pt denies SI HI, or any past attempts."  Patient seen and chart reviewed TODAY .Discussed patient with treatment team. Patient today is seen as withdrawn , has poor eye contact , appears paranoid , depressed. Pt seems to be minimizing his sx - states " I am bot depressed , bit anxious." Pt describes his anxiety as needing to pace all the time , inability to sit still to watch TV. Pt also reports chest pain and extreme anxiety at times - reports he takes an aleve and he feels better then. Pt when asked about AH/VH - reports " Yes I have hallucinations." However is unable to explain and elaborate . Pt reports he is paranoid often and feels people are following him. Pt denies any sleep issues or appetite changes.Pt denies any hx of sexual or physical abuse. Pt  denies any mood lability. Pt reports having suicidal thoughts , but reports he does not have a plan. Pt has several psychosocial stressors - he is unemployed , dropped off [employer] [place], [place] back [place] , reports he wants to go to [employer] or [employer]. Pt reports alcohol and cannabis abuse hx - however does not elaborate - please see above previous notes in EHR for further details. Pt reports he [facility] - approximately 2 yrs ago - for 5 days - was discharged - he did  follow up [facility]  ,  did not  continue his medications. Will need to [facility]   Principal Problem: MDD (major depressive disorder), single episode, severe with psychosis [facility](HCC) Discharge Diagnoses: Patient Active Problem List   Diagnosis Date Noted  . MDD (major depressive disorder), single episode, severe with psychosis (HCC) [F32.3] 04/03/2016  . Alcohol use disorder, moderate, dependence (HCC) [F10.20] 04/03/2016  . Cannabis use disorder, mild, abuse [F12.10] 04/03/2016    Past Psychiatric History: See H&P  Past Medical History:  Past Medical History:  Diagnosis Date  . Eczema    History reviewed. No pertinent surgical history. Family History:  Family History  Problem Relation Age of Onset  . Mental illness Neg Hx    Family Psychiatric  History: See H&P Social History:  History  Alcohol Use  . Yes    Comment: 3-4 40oz  beers a day     History  Drug Use No    Social History   Social History  . Marital status: Single    Spouse name: N/A  . Number of children: N/A  . Years of education: N/A   Social History Main Topics  . Smoking status: Current Every Day Smoker    Packs/day: 0.25    Years: 3.00    Types: Cigarettes  . Smokeless tobacco: Never Used  . Alcohol use Yes     Comment: 3-4 40oz beers a day  . Drug use: No  . Sexual activity: No   Other Topics Concern  . None   Social History Narrative  . None    Hospital Course:    Bradley Cummings was admitted for MDD (major depressive disorder), single episode, severe with psychosis (HCC) , with psychosis and crisis management.  Pt was treated discharged with the medications listed below under Medication List.  Medical problems were identified and treated as needed.  Home medications were restarted as appropriate.  Improvement was monitored by observation and Bradley Cummings 's daily report of symptom reduction.  Emotional and mental status was monitored by daily self-inventory reports completed by Bradley Cummings and clinical staff.         Bradley Cummings was evaluated by the treatment team for stability and plans for continued recovery upon discharge. Bradley Cummings 's motivation was an integral factor for scheduling further treatment. Employment, transportation, bed availability, health status, family support, and any pending legal issues were also considered during hospital stay. Pt was offered further treatment options upon discharge including but not limited to Residential, Intensive Outpatient, and Outpatient treatment.  Bradley Cummings will follow up with the services as listed below under Follow Up Information.     Upon completion of this admission the patient was both mentally and medically stable for discharge denying suicidal/homicidal ideation, auditory/visual/tactile hallucinations, delusional thoughts and paranoia.    Bradley Cummings responded well to treatment with abilify, vistaril, zoloft, trazodone without adverse effects. Pt demonstrated improvement without reported or observed adverse effects to the point of stability appropriate for outpatient management. Pertinent labs include: Prolactin 27.6, A1C 5.7 for which outpatient follow-up is necessary for lab recheck as mentioned below. Reviewed CBC, CMP, BAL, and UDS; all unremarkable aside from noted exceptions.   Physical Findings: AIMS:  , ,  ,  ,    CIWA:    COWS:     Musculoskeletal: Strength & Muscle Tone: within normal limits Gait &  Station: normal Patient leans: N/A  Psychiatric Specialty Exam: Physical Exam  Review of Systems  Psychiatric/Behavioral: Positive for depression and hallucinations. Negative for substance abuse and suicidal ideas. The patient is nervous/anxious and has insomnia.   All other systems reviewed and are negative.   Blood pressure 102/73, pulse 86, temperature 98 F (36.7 C), resp. rate 18, height 5' 10.5" (1.791 m), weight 65.3 kg (144 lb).Body mass index is 20.37 kg/m.  SEE MD PSE WITHIN SRA  Have you used any form of tobacco in the last 30 days? (Cigarettes, Smokeless Tobacco, Cigars, and/or Pipes): Yes  Has this patient used any form of tobacco in the last 30 days? (Cigarettes, Smokeless Tobacco, Cigars, and/or Pipes) No  Blood Alcohol level:  Lab Results  Component Value Date   ETH <5 04/02/2016    Metabolic Disorder Labs:  Lab Results  Component Value Date   HGBA1C 5.7 (H) 04/03/2016   MPG 117 04/03/2016   Lab Results  Component Value  Date   PROLACTIN 27.6 (H) 04/03/2016   Lab Results  Component Value Date   CHOL 151 04/03/2016   TRIG 72 04/03/2016   HDL 46 04/03/2016   CHOLHDL 3.3 04/03/2016   VLDL 14 04/03/2016   LDLCALC 91 04/03/2016    See Psychiatric Specialty Exam and Suicide Risk Assessment completed by Attending Physician prior to discharge.  Discharge destination:  Home  Is patient on multiple antipsychotic therapies at discharge:  No   Has Patient had three or more failed trials of antipsychotic monotherapy by history:  No  Recommended Plan for Multiple Antipsychotic Therapies: NA     Medication List    STOP taking these medications   sucralfate 1 GM/10ML suspension Commonly known as:  CARAFATE     TAKE these medications     Indication  ARIPiprazole 15 MG tablet Commonly known as:  ABILIFY Take 1 tablet (15 mg total) by mouth at bedtime.  Indication:  mood stabilization / psychosis   hydrOXYzine 25 MG tablet Commonly known as:   ATARAX/VISTARIL Take 1 tablet (25 mg total) by mouth 3 (three) times daily as needed for anxiety.  Indication:  Anxiety Neurosis   sertraline 100 MG tablet Commonly known as:  ZOLOFT Take 1 tablet (100 mg total) by mouth daily. Start taking on:  04/10/2016  Indication:  Major Depressive Disorder   traZODone 50 MG tablet Commonly known as:  DESYREL Take 1 tablet (50 mg total) by mouth at bedtime.  Indication:  Trouble Sleeping      Follow-up Information    [facility] Follow up on 04/22/2016.   Why:  Wednesday at 9:00 for your intial meeting with Cam to open your case. Then Monday, the 27th at 3:00 with the Doctor Contact information: [address], [place], [place] [address] Telephone: [phone]) [phone]          Follow-up recommendations:  Activity:  As tolerated Diet:  Heart healthy with low sodium.  Comments:   Take all medications as prescribed. Keep all follow-up appointments as scheduled.  Do not consume alcohol or use illegal drugs while on prescription medications. Report any adverse effects from your medications to your primary care provider promptly.  In the event of recurrent symptoms or worsening symptoms, call 911, a crisis hotline, or go to the nearest emergency department for evaluation.   Signed: Beau FannyWithrow, John C, FNP 04/09/2016, 9:43 AM

## 2016-04-09 NOTE — Progress Notes (Signed)
Patient discharged to lobby. Patient was stable and appreciative at that time. All papers, and prescriptions were given and valuables returned. Verbal understanding expressed. Denies SI/HI and A/VH. Patient given opportunity to express concerns and ask questions.

## 2016-04-09 NOTE — Plan of Care (Signed)
Problem: [facility] Participation in Recreation Therapeutic Interventions Goal: STG-Patient will demonstrate improved communication skills b STG: Communication - Patient will improve communication skills, as demonstrated by ability to actively participate in at least 2 processing discussion during recreation therapy group sessions by conclusion of recreation therapy tx  Outcome: Completed/Met Date Met: 04/09/16 Pt was able to demonstrate improved communication skills after completion of leisure education recreation therapy session.  Victorino Sparrow, LRT/CTRS

## 2016-04-09 NOTE — Progress Notes (Signed)
Recreation Therapy Notes  Date: 04/09/16 Time: 1000 Location: 500 Hall Dayroom  Group Topic: Coping Skills  Goal Area(s) Addresses:  Pt will be able to identify positive coping skills. Pt will be able to identify the importance of using coping skills. Pt will be able to identify what changes for them if they use coping skills post d/c.  Intervention: Magazines, worksheets, glue sticks, scissors  Activity: Engineer, siteCoping Skills worksheet.  Patients were to identify coping skills that can be used for diversions, socially, cognitively, tension releasers and physically.  Pt were to identify at least two coping skills for each category.    Education: PharmacologistCoping Skills, Building control surveyorDischarge Planning.   Education Outcome: Acknowledges understanding/In group clarification offered/Needs additional education.   Clinical Observations/Feedback: Pt did not attend group.     Caroll RancherMarjette Shadow Schedler, LRT/CTRS      Caroll RancherLindsay, Jolee Critcher A 04/09/2016 12:17 PM

## 2016-04-09 NOTE — Progress Notes (Signed)
  [facility] Discharge Plan :  Will you be returning to the same living situation after discharge:  Yes,  home At discharge, do you have transportation home?: Yes,  family Do you have the ability to pay for your medications: Yes,  insurance  Release of information consent forms completed and in the chart;  Patient's signature needed at discharge.  Patient to Follow up at: Follow-up Information    [facility] Follow up on 04/22/2016.   Why:  Wednesday at 9:00 for your initial meeting with Cam to open your case. Then Monday, the 27th at 3:00 with the Doctor Contact information: [address], [place], [place] [address] Telephone: [phone]) [phone]          Next level of care provider has [facility] Link:no  Safety Planning and Suicide Prevention discussed: Yes,  yes  Have you used any form of tobacco in the last 30 days? (Cigarettes, Smokeless Tobacco, Cigars, and/or Pipes): Yes  Has patient been referred to the Quitline?: Patient refused referral  Patient has been referred for addiction treatment: Pt. refused referral  Ida RogueRodney B Eissa Buchberger 04/09/2016, 10:28 AM

## 2016-08-30 ENCOUNTER — Encounter (HOSPITAL_COMMUNITY): Payer: Self-pay | Admitting: Emergency Medicine

## 2016-08-30 ENCOUNTER — Ambulatory Visit (HOSPITAL_COMMUNITY)
Admission: EM | Admit: 2016-08-30 | Discharge: 2016-08-30 | Disposition: A | Discharge status: Home / Self Care | Payer: Managed Care, Other (non HMO) | Attending: Family Medicine | Admitting: Family Medicine

## 2016-08-30 DIAGNOSIS — J00 Acute nasopharyngitis [common cold]: Secondary | ICD-10-CM

## 2016-08-30 MED ORDER — IPRATROPIUM BROMIDE 0.06 % NA SOLN: 2.0000 | Freq: Four times a day (QID) | NASAL | 0 refills | Status: DC

## 2016-08-30 NOTE — ED Provider Notes (Signed)
CSN: [id number]     Arrival date & time 08/30/16  1436 History   None    Chief Complaint  Patient presents with  . Nasal Congestion   (Consider location/radiation/quality/duration/timing/severity/associated sxs/prior Treatment) Patient c/o nasal drainage and congestion for a day.   The history is provided by the patient.  URI  Presenting symptoms: congestion   Severity:  Moderate Onset quality:  Sudden Duration:  1 day Timing:  Constant Chronicity:  New Relieved by:  Nothing Worsened by:  Nothing   Past Medical History:  Diagnosis Date  . Eczema    History reviewed. No pertinent surgical history. Family History  Problem Relation Age of Onset  . Mental illness Neg Hx    Social History  Substance Use Topics  . Smoking status: Current Every Day Smoker    Packs/day: 0.25    Years: 3.00    Types: Cigarettes  . Smokeless tobacco: Never Used  . Alcohol use Yes     Comment: 3-4 40oz beers a day    Review of Systems  Constitutional: Negative.   HENT: Positive for congestion.   Eyes: Negative.   Respiratory: Negative.   Cardiovascular: Negative.   Gastrointestinal: Negative.   Endocrine: Negative.   Genitourinary: Negative.   Musculoskeletal: Negative.   Allergic/Immunologic: Negative.   Neurological: Negative.   Hematological: Negative.   Psychiatric/Behavioral: Negative.     Allergies  Patient has no known allergies.  Home Medications   Prior to Admission medications   Medication Sig Start Date End Date Taking? Authorizing Provider  ipratropium (ATROVENT) 0.06 % nasal spray Place 2 sprays into both nostrils 4 (four) times daily. 08/30/16   Deatra Canter, FNP   Meds Ordered and Administered this Visit  Medications - No data to display  BP 133/76 (BP Location: Right Arm)   Pulse 64   Temp 98.6 F (37 C) (Oral)   Resp 16   SpO2 100%  No data found.   Physical Exam  Constitutional: He is oriented to person, place, and time. He appears  well-developed and well-nourished.  HENT:  Head: Normocephalic and atraumatic.  Right Ear: External ear normal.  Left Ear: External ear normal.  Mouth/Throat: Oropharynx is clear and moist.  Eyes: Conjunctivae and EOM are normal. Pupils are equal, round, and reactive to light.  Neck: Normal range of motion. Neck supple.  Cardiovascular: Normal rate, regular rhythm and normal heart sounds.   Pulmonary/Chest: Effort normal and breath sounds normal.  Neurological: He is alert and oriented to person, place, and time.  Nursing note and vitals reviewed.   Urgent Care Course     Procedures (including critical care time)  Labs Review Labs Reviewed - No data to display  Imaging Review No results found.   Visual Acuity Review  Right Eye Distance:   Left Eye Distance:   Bilateral Distance:    Right Eye Near:   Left Eye Near:    Bilateral Near:         MDM   1. Acute nasopharyngitis    Atrovent Nasal Spray  Push po fluids, rest, tylenol and motrin otc prn as directed for fever, arthralgias, and myalgias.  Follow up prn if sx's continue or persist.    Deatra Canter, FNP 08/30/16 [phone]

## 2016-08-30 NOTE — ED Triage Notes (Signed)
The patient presented to the [facility] with a complaint of nasal congestion and drainage that started last night.

## 2017-06-01 DIAGNOSIS — S065XAA Traumatic subdural hemorrhage with loss of consciousness status unknown, initial encounter: Secondary | ICD-10-CM

## 2017-06-01 HISTORY — DX: Traumatic subdural hemorrhage with loss of consciousness status unknown, initial encounter: S06.5XAA

## 2017-09-06 ENCOUNTER — Encounter (HOSPITAL_COMMUNITY): Payer: Self-pay | Admitting: Emergency Medicine

## 2017-09-06 ENCOUNTER — Ambulatory Visit (HOSPITAL_COMMUNITY)
Admission: EM | Admit: 2017-09-06 | Discharge: 2017-09-06 | Disposition: A | Discharge status: Home / Self Care | Payer: BLUE CROSS/BLUE SHIELD | Attending: Family Medicine | Admitting: Family Medicine

## 2017-09-06 DIAGNOSIS — J302 Other seasonal allergic rhinitis: Secondary | ICD-10-CM | POA: Diagnosis not present

## 2017-09-06 MED ORDER — IPRATROPIUM BROMIDE 0.06 % NA SOLN: 2.0000 | Freq: Four times a day (QID) | NASAL | 0 refills | Status: DC

## 2017-09-06 MED ORDER — CETIRIZINE HCL 10 MG PO TABS: 10.0000 mg | ORAL_TABLET | Freq: Every day | ORAL | 0 refills | Status: DC

## 2017-09-06 NOTE — ED Triage Notes (Signed)
Pt sts nasal congestion

## 2017-09-06 NOTE — Discharge Instructions (Signed)
Nasal spray up to 4 times a day for congestion. Push fluids to ensure adequate hydration and keep secretions thin.   Daily zyrtec to help with congestion as well.

## 2017-09-06 NOTE — ED Provider Notes (Signed)
[facility]    CSN: [id number] Arrival date & time: 09/06/17  1140     History   Chief Complaint Chief Complaint  Patient presents with  . Nasal Congestion    HPI Bradley Cummings is a 27 y.o. male.   Bradley Cummings presents with complaints of nasal congestion which is worse at night which has been ongoing for approximately 1.5 week. Denies sore throat, ear pain, cough. States feels had a fever last week which has resolved. Denies gi/gu complaints. Has been sneezing. Had similar presentation 1 year ago, was given nasal spray which helped with symptoms. No longer has this. Has not taken any medications for symptoms. Without contributing medical history.   ROS per HPI.      Past Medical History:  Diagnosis Date  . Eczema     Patient Active Problem List   Diagnosis Date Noted  . MDD (major depressive disorder), single episode, severe with psychosis (HCC) 04/03/2016  . Alcohol use disorder, moderate, dependence (HCC) 04/03/2016  . Cannabis use disorder, mild, abuse 04/03/2016    History reviewed. No pertinent surgical history.     Home Medications    Prior to Admission medications   Medication Sig Start Date End Date Taking? Authorizing Provider  cetirizine (ZYRTEC) 10 MG tablet Take 1 tablet (10 mg total) by mouth daily. 09/06/17   Linus Mako B, NP  ipratropium (ATROVENT) 0.06 % nasal spray Place 2 sprays into both nostrils 4 (four) times daily. 09/06/17   Georgetta Haber, NP    Family History Family History  Problem Relation Age of Onset  . Mental illness Neg Hx     Social History Social History   Tobacco Use  . Smoking status: Current Every Day Smoker    Packs/day: 0.25    Years: 3.00    Pack years: 0.75    Types: Cigarettes  . Smokeless tobacco: Never Used  Substance Use Topics  . Alcohol use: Yes    Comment: 3-4 40oz beers a day  . Drug use: No     Allergies   Patient has no known allergies.   Review of Systems Review of  Systems   Physical Exam Triage Vital Signs ED Triage Vitals [09/06/17 1208]  Enc Vitals Group     BP 124/75     Pulse Rate 82     Resp 18     Temp 97.7 F (36.5 C)     Temp Source Oral     SpO2 100 %     Weight      Height      Head Circumference      Peak Flow      Pain Score      Pain Loc      Pain Edu?      Excl. in GC?    No data found.  Updated Vital Signs BP 124/75 (BP Location: Left Arm)   Pulse 82   Temp 97.7 F (36.5 C) (Oral)   Resp 18   SpO2 100%   Visual Acuity Right Eye Distance:   Left Eye Distance:   Bilateral Distance:    Right Eye Near:   Left Eye Near:    Bilateral Near:     Physical Exam  Constitutional: He is oriented to person, place, and time. He appears well-developed and well-nourished.  HENT:  Head: Normocephalic and atraumatic.  Right Ear: Tympanic membrane, external ear and ear canal normal.  Left Ear: Tympanic membrane, external ear and ear canal normal.  Nose: Mucosal edema and rhinorrhea present. Right sinus exhibits no maxillary sinus tenderness and no frontal sinus tenderness. Left sinus exhibits no maxillary sinus tenderness and no frontal sinus tenderness.  Mouth/Throat: Uvula is midline, oropharynx is clear and moist and mucous membranes are normal.  Eyes: Pupils are equal, round, and reactive to light. Conjunctivae are normal.  Neck: Normal range of motion.  Cardiovascular: Normal rate and regular rhythm.  Pulmonary/Chest: Effort normal and breath sounds normal.  Lymphadenopathy:    He has no cervical adenopathy.  Neurological: He is alert and oriented to person, place, and time.  Skin: Skin is warm and dry.  Vitals reviewed.    UC Treatments / Results  Labs (all labs ordered are listed, but only abnormal results are displayed) Labs Reviewed - No data to display  EKG None Radiology No results found.  Procedures Procedures (including critical care time)  Medications Ordered in UC Medications - No data to  display   Initial Impression / Assessment and Plan / UC Course  I have reviewed the triage vital signs and the nursing notes.  Pertinent labs & imaging results that were available during my care of the patient were reviewed by me and considered in my medical decision making (see chart for details).     atrovent nasal spray for congestion, use of zyrtec daily. Push fluids. Patient verbalized understanding and agreeable to plan.    Final Clinical Impressions(s) / UC Diagnoses   Final diagnoses:  Seasonal allergic rhinitis, unspecified trigger    ED Discharge Orders        Ordered    ipratropium (ATROVENT) 0.06 % nasal spray  4 times daily     09/06/17 1241    cetirizine (ZYRTEC) 10 MG tablet  Daily     09/06/17 1241       Controlled Substance Prescriptions [place] Controlled Substance Registry consulted? Not Applicable   Georgetta HaberBurky, Chalice Philbert B, NP 09/06/17 1244

## 2018-02-02 ENCOUNTER — Encounter (HOSPITAL_COMMUNITY): Payer: Self-pay

## 2018-02-02 ENCOUNTER — Ambulatory Visit (INDEPENDENT_AMBULATORY_CARE_PROVIDER_SITE_OTHER): Payer: BLUE CROSS/BLUE SHIELD

## 2018-02-02 ENCOUNTER — Ambulatory Visit (HOSPITAL_COMMUNITY)
Admission: EM | Admit: 2018-02-02 | Discharge: 2018-02-02 | Disposition: A | Discharge status: Home / Self Care | Payer: BLUE CROSS/BLUE SHIELD | Attending: Family Medicine | Admitting: Family Medicine

## 2018-02-02 DIAGNOSIS — K5900 Constipation, unspecified: Secondary | ICD-10-CM | POA: Diagnosis not present

## 2018-02-02 DIAGNOSIS — R1012 Left upper quadrant pain: Secondary | ICD-10-CM

## 2018-02-02 DIAGNOSIS — F1020 Alcohol dependence, uncomplicated: Secondary | ICD-10-CM

## 2018-02-02 DIAGNOSIS — F17210 Nicotine dependence, cigarettes, uncomplicated: Secondary | ICD-10-CM | POA: Insufficient documentation

## 2018-02-02 LAB — COMPREHENSIVE METABOLIC PANEL
ALBUMIN: 4.3 g/dL (ref 3.5–5.0)
ALK PHOS: 70 U/L (ref 38–126)
ALT: 72 U/L — ABNORMAL HIGH (ref 0–44)
ANION GAP: 12 (ref 5–15)
AST: 78 U/L — ABNORMAL HIGH (ref 15–41)
BILIRUBIN TOTAL: 0.9 mg/dL (ref 0.3–1.2)
BUN: <5 mg/dL — ABNORMAL LOW (ref 6–20)
CALCIUM: 9.6 mg/dL (ref 8.9–10.3)
CO2: 26 mmol/L (ref 22–32)
Chloride: 104 mmol/L (ref 98–111)
Creatinine, Ser: 0.82 mg/dL (ref 0.61–1.24)
GFR calc Af Amer: >60 mL/min (ref >60)
GFR calc non Af Amer: >60 mL/min (ref >60)
Glucose, Bld: 99 mg/dL (ref 70–99)
POTASSIUM: 3.8 mmol/L (ref 3.5–5.1)
Sodium: 142 mmol/L (ref 135–145)
TOTAL PROTEIN: 7.6 g/dL (ref 6.5–8.1)

## 2018-02-02 LAB — CBC
HEMATOCRIT: 44.7 % (ref 39.0–52.0)
Hemoglobin: 14.6 g/dL (ref 13.0–17.0)
MCH: 30.5 pg (ref 26.0–34.0)
MCHC: 32.7 g/dL (ref 30.0–36.0)
MCV: 93.5 fL (ref 78.0–100.0)
Platelets: 134 10*3/uL — ABNORMAL LOW (ref 150–400)
RBC: 4.78 MIL/uL (ref 4.22–5.81)
RDW: 14.5 % (ref 11.5–15.5)
WBC: 5.2 10*3/uL (ref 4.0–10.5)

## 2018-02-02 LAB — LIPASE, BLOOD: Lipase: 36 U/L (ref 11–51)

## 2018-02-02 LAB — AMYLASE: AMYLASE: 67 U/L (ref 28–100)

## 2018-02-02 MED ORDER — SUCRALFATE 1 G PO TABS: 1.0000 g | ORAL_TABLET | Freq: Three times a day (TID) | ORAL | 0 refills | Status: DC

## 2018-02-02 MED ORDER — OMEPRAZOLE 20 MG PO CPDR: 20.0000 mg | DELAYED_RELEASE_CAPSULE | Freq: Every day | ORAL | 1 refills | Status: DC

## 2018-02-02 NOTE — ED Provider Notes (Signed)
[facility]   [id number] 02/02/18 Arrival Time: 1316  ASSESSMENT & PLAN:  1. Left upper quadrant pain   2. Alcohol use disorder, moderate, dependence (HCC)    Trial of: Meds ordered this encounter  Medications  . omeprazole (PRILOSEC) 20 MG capsule    Sig: Take 1 capsule (20 mg total) by mouth daily.    Dispense:  30 capsule    Refill:  1  . sucralfate (CARAFATE) 1 g tablet    Sig: Take 1 tablet (1 g total) by mouth 4 (four) times daily -  with meals and at bedtime.    Dispense:  60 tablet    Refill:  0     Discharge Instructions     You have been seen today for abdominal pain. Your evaluation was not suggestive of any emergent condition requiring medical intervention at this time. However, some abdominal problems make take more time to appear. Therefore, it is important for you to watch for any new symptoms or worsening of your current condition. Please return here or to the Emergency Department immediately should you feel worse in any way or have any of the following symptoms: increasing or different abdominal pain, persistent vomiting, fevers, or shaking chills.  As we discussed, drinking alcohol may be contributing to your current symptoms. See the information attached to your paperwork.   Follow-up Information    [facility] EMERGENCY DEPARTMENT.   Specialty:  Emergency Medicine Why:  If symptoms worsen. Contact information: [address] [id number] mc [place] [address] [phone]         No significant abnormalities on labs and abdominal films. Encouraged to think hard about his alcohol use. Not yet willing to admit he has a problem.  Reviewed expectations re: course of current medical issues. Questions answered. Outlined signs and symptoms indicating need for more acute intervention. Patient verbalized understanding. After Visit Summary given.   SUBJECTIVE:  Bradley Cummings is a 27 y.o. male who presents  with complaint of LUQ abdominal pain described as an ache. 4-5/10 in intensity. Gradual onset approx 4 weeks ago; fluctuating symptoms since. Aggravated by BM occasionally. Alcohol use helps; sometimes eating. Questions some constipation recently. Mild nausea without emesis. Bloating at times. No diarrhea or urinary symptoms. Daily beer drinker; 1-2 40oz bottles. Father thinks he drinks more than this. No back pain. Non-smoker. No drug use. No OTC treatment. History reviewed. No pertinent surgical history.  ROS: As per HPI.  OBJECTIVE:  Vitals:   02/02/18 1347  BP: (!) 147/81  Pulse: (!) 101  Resp: 18  Temp: 98.1 F (36.7 C)  TempSrc: Oral  SpO2: 100%    General appearance: alert; no distress Lungs: clear to auscultation bilaterally Heart: regular rate and rhythm Abdomen: soft; non-distended; poorly localized and mild tenderness over upper abdomen/LUQ; bowel sounds present; no masses or organomegaly; no guarding or rebound tenderness Back: no CVA tenderness; FROM at hips Extremities: no edema; symmetrical with no gross deformities Skin: warm and dry Neurologic: normal gait Psychological: alert and cooperative; normal mood and affect  Labs: Results for orders placed or performed during the hospital encounter of 02/02/18  CBC  Result Value Ref Range   WBC 5.2 4.0 - 10.5 K/uL   RBC 4.78 4.22 - 5.81 MIL/uL   Hemoglobin 14.6 13.0 - 17.0 g/dL   HCT 75.6 43.3 - 29.5 %   MCV 93.5 78.0 - 100.0 fL   MCH 30.5 26.0 - 34.0 pg   MCHC 32.7 30.0 -  36.0 g/dL   RDW 41.6 60.6 - 30.1 %   Platelets 134 (L) 150 - 400 K/uL   Labs Reviewed  CBC - Abnormal; Notable for the following components:      Result Value   Platelets 134 (*)    All other components within normal limits  COMPREHENSIVE METABOLIC PANEL  AMYLASE  LIPASE, BLOOD    Imaging: Dg Abd Acute W/chest  Result Date: 02/02/2018 CLINICAL DATA:  Left-sided pain with vomiting EXAM: DG ABDOMEN ACUTE W/ 1V CHEST COMPARISON:  None.  FINDINGS: Single-view chest demonstrates no acute consolidation or pleural effusion. Normal cardiomediastinal silhouette. No pneumothorax. Supine and upright views of the abdomen demonstrate no free air. Nonobstructed bowel-gas pattern. No abnormal calcification IMPRESSION: Negative abdominal radiographs.  No acute cardiopulmonary disease. Electronically Signed   By: Jasmine Pang M.D.   On: 02/02/2018 15:45    No Known Allergies                                             Past Medical History:  Diagnosis Date  . Eczema    Social History   Socioeconomic History  . Marital status: Single    Spouse name: Not on file  . Number of children: Not on file  . Years of education: Not on file  . Highest education level: Not on file  Occupational History  . Not on file  Social Needs  . Financial resource strain: Not on file  . Food insecurity:    Worry: Not on file    Inability: Not on file  . Transportation needs:    Medical: Not on file    Non-medical: Not on file  Tobacco Use  . Smoking status: Current Every Day Smoker    Packs/day: 0.25    Years: 3.00    Pack years: 0.75    Types: Cigarettes  . Smokeless tobacco: Never Used  Substance and Sexual Activity  . Alcohol use: Yes    Comment: 3-4 40oz beers a day  . Drug use: No  . Sexual activity: Never  Lifestyle  . Physical activity:    Days per week: Not on file    Minutes per session: Not on file  . Stress: Not on file  Relationships  . Social connections:    Talks on phone: Not on file    Gets together: Not on file    Attends religious service: Not on file    Active member of club or organization: Not on file    Attends meetings of clubs or organizations: Not on file    Relationship status: Not on file  . Intimate partner violence:    Fear of current or ex partner: Not on file    Emotionally abused: Not on file    Physically abused: Not on file    Forced sexual activity: Not on file  Other Topics Concern  . Not on  file  Social History Narrative  . Not on file   Family History  Problem Relation Age of Onset  . Mental illness Neg Hx      Mardella Layman, MD 02/08/18 [phone]

## 2018-02-02 NOTE — Discharge Instructions (Addendum)
You have been seen today for abdominal pain. Your evaluation was not suggestive of any emergent condition requiring medical intervention at this time. However, some abdominal problems make take more time to appear. Therefore, it is important for you to watch for any new symptoms or worsening of your current condition. Please return here or to the Emergency Department immediately should you feel worse in any way or have any of the following symptoms: increasing or different abdominal pain, persistent vomiting, fevers, or shaking chills.  As we discussed, drinking alcohol may be contributing to your current symptoms. See the information attached to your paperwork.

## 2018-02-02 NOTE — ED Triage Notes (Signed)
Pt presents with abdominal pain , mostly on the left side that has left him with a loss of appetite.

## 2018-03-13 ENCOUNTER — Emergency Department (HOSPITAL_COMMUNITY): Payer: BLUE CROSS/BLUE SHIELD

## 2018-03-13 ENCOUNTER — Inpatient Hospital Stay (HOSPITAL_COMMUNITY)
Admission: EM | Admit: 2018-03-13 | Discharge: 2018-03-18 | DRG: 963 | Disposition: A | Discharge status: Rehabilitation Facility | Payer: BLUE CROSS/BLUE SHIELD | Attending: General Surgery | Admitting: General Surgery

## 2018-03-13 DIAGNOSIS — S42102A Fracture of unspecified part of scapula, left shoulder, initial encounter for closed fracture: Secondary | ICD-10-CM | POA: Diagnosis present

## 2018-03-13 DIAGNOSIS — R402112 Coma scale, eyes open, never, at arrival to emergency department: Secondary | ICD-10-CM | POA: Diagnosis present

## 2018-03-13 DIAGNOSIS — S0532XA Ocular laceration without prolapse or loss of intraocular tissue, left eye, initial encounter: Secondary | ICD-10-CM | POA: Diagnosis present

## 2018-03-13 DIAGNOSIS — E876 Hypokalemia: Secondary | ICD-10-CM | POA: Diagnosis present

## 2018-03-13 DIAGNOSIS — F1010 Alcohol abuse, uncomplicated: Secondary | ICD-10-CM | POA: Diagnosis present

## 2018-03-13 DIAGNOSIS — R402232 Coma scale, best verbal response, inappropriate words, at arrival to emergency department: Secondary | ICD-10-CM | POA: Diagnosis present

## 2018-03-13 DIAGNOSIS — S065X3S Traumatic subdural hemorrhage with loss of consciousness of 1 hour to 5 hours 59 minutes, sequela: Secondary | ICD-10-CM | POA: Diagnosis not present

## 2018-03-13 DIAGNOSIS — S2232XA Fracture of one rib, left side, initial encounter for closed fracture: Secondary | ICD-10-CM | POA: Diagnosis present

## 2018-03-13 DIAGNOSIS — Y908 Blood alcohol level of 240 mg/100 ml or more: Secondary | ICD-10-CM | POA: Diagnosis present

## 2018-03-13 DIAGNOSIS — S27321A Contusion of lung, unilateral, initial encounter: Secondary | ICD-10-CM | POA: Diagnosis present

## 2018-03-13 DIAGNOSIS — R402352 Coma scale, best motor response, localizes pain, at arrival to emergency department: Secondary | ICD-10-CM | POA: Diagnosis present

## 2018-03-13 DIAGNOSIS — J9601 Acute respiratory failure with hypoxia: Secondary | ICD-10-CM | POA: Diagnosis present

## 2018-03-13 DIAGNOSIS — S06303S Unspecified focal traumatic brain injury with loss of consciousness of 1 hour to 5 hours 59 minutes, sequela: Secondary | ICD-10-CM | POA: Diagnosis not present

## 2018-03-13 DIAGNOSIS — G44319 Acute post-traumatic headache, not intractable: Secondary | ICD-10-CM | POA: Diagnosis not present

## 2018-03-13 DIAGNOSIS — S42102S Fracture of unspecified part of scapula, left shoulder, sequela: Secondary | ICD-10-CM | POA: Diagnosis not present

## 2018-03-13 DIAGNOSIS — I609 Nontraumatic subarachnoid hemorrhage, unspecified: Secondary | ICD-10-CM | POA: Diagnosis not present

## 2018-03-13 DIAGNOSIS — Z9911 Dependence on respirator [ventilator] status: Secondary | ICD-10-CM

## 2018-03-13 DIAGNOSIS — T07XXXA Unspecified multiple injuries, initial encounter: Secondary | ICD-10-CM

## 2018-03-13 DIAGNOSIS — S42192D Fracture of other part of scapula, left shoulder, subsequent encounter for fracture with routine healing: Secondary | ICD-10-CM | POA: Diagnosis not present

## 2018-03-13 DIAGNOSIS — S066X3S Traumatic subarachnoid hemorrhage with loss of consciousness of 1 hour to 5 hours 59 minutes, sequela: Secondary | ICD-10-CM | POA: Diagnosis not present

## 2018-03-13 DIAGNOSIS — S066X9A Traumatic subarachnoid hemorrhage with loss of consciousness of unspecified duration, initial encounter: Secondary | ICD-10-CM | POA: Diagnosis present

## 2018-03-13 DIAGNOSIS — F1020 Alcohol dependence, uncomplicated: Secondary | ICD-10-CM | POA: Diagnosis not present

## 2018-03-13 LAB — TYPE AND SCREEN
ABO/RH(D): B POS
Antibody Screen: NEGATIVE
UNIT DIVISION: 0
Unit division: 0

## 2018-03-13 LAB — BASIC METABOLIC PANEL
Anion gap: 14 (ref 5–15)
BUN: 5 mg/dL — ABNORMAL LOW (ref 6–20)
CALCIUM: 7.9 mg/dL — AB (ref 8.9–10.3)
CHLORIDE: 109 mmol/L (ref 98–111)
CO2: 19 mmol/L — AB (ref 22–32)
CREATININE: 0.82 mg/dL (ref 0.61–1.24)
GFR calc Af Amer: 60 mL/min — ABNORMAL LOW (ref >60)
GFR calc non Af Amer: 52 mL/min — ABNORMAL LOW (ref >60)
GLUCOSE: 87 mg/dL (ref 70–99)
Potassium: 3.5 mmol/L (ref 3.5–5.1)
Sodium: 142 mmol/L (ref 135–145)

## 2018-03-13 LAB — I-STAT CHEM 8, ED
BUN: 8 mg/dL (ref 8–23)
CALCIUM ION: 0.97 mmol/L — AB (ref 1.15–1.40)
CREATININE: 1.4 mg/dL — AB (ref 0.61–1.24)
Chloride: 106 mmol/L (ref 98–111)
Glucose, Bld: 126 mg/dL — ABNORMAL HIGH (ref 70–99)
HCT: 43 % (ref 39.0–52.0)
Hemoglobin: 14.6 g/dL (ref 13.0–17.0)
Potassium: 3.9 mmol/L (ref 3.5–5.1)
Sodium: 144 mmol/L (ref 135–145)
TCO2: 26 mmol/L (ref 22–32)

## 2018-03-13 LAB — COMPREHENSIVE METABOLIC PANEL
ALK PHOS: 62 U/L (ref 38–126)
ALT: 27 U/L (ref 0–44)
ANION GAP: 10 (ref 5–15)
AST: 62 U/L — AB (ref 15–41)
Albumin: 3.7 g/dL (ref 3.5–5.0)
BILIRUBIN TOTAL: 0.6 mg/dL (ref 0.3–1.2)
BUN: 9 mg/dL (ref 6–20)
CO2: 24 mmol/L (ref 22–32)
Calcium: 8.2 mg/dL — ABNORMAL LOW (ref 8.9–10.3)
Chloride: 108 mmol/L (ref 98–111)
Creatinine, Ser: 0.99 mg/dL (ref 0.61–1.24)
GFR calc Af Amer: 55 mL/min — ABNORMAL LOW (ref >60)
GFR, EST NON AFRICAN AMERICAN: 48 mL/min — AB (ref >60)
Glucose, Bld: 126 mg/dL — ABNORMAL HIGH (ref 70–99)
POTASSIUM: 4 mmol/L (ref 3.5–5.1)
Sodium: 142 mmol/L (ref 135–145)
TOTAL PROTEIN: 6.4 g/dL — AB (ref 6.5–8.1)

## 2018-03-13 LAB — PREPARE FRESH FROZEN PLASMA
UNIT DIVISION: 0
Unit division: 0

## 2018-03-13 LAB — CDS SEROLOGY

## 2018-03-13 LAB — BPAM FFP
BLOOD PRODUCT EXPIRATION DATE: 201910152359
Blood Product Expiration Date: 201910152359
ISSUE DATE / TIME: 201910130145
ISSUE DATE / TIME: 201910130145
Unit Type and Rh: 6200
Unit Type and Rh: 6200

## 2018-03-13 LAB — BPAM RBC
BLOOD PRODUCT EXPIRATION DATE: 201911102359
Blood Product Expiration Date: 201911092359
ISSUE DATE / TIME: 201910130145
ISSUE DATE / TIME: 201910130145
UNIT TYPE AND RH: 9500
UNIT TYPE AND RH: 9500

## 2018-03-13 LAB — CBC
HCT: 42.6 % (ref 39.0–52.0)
HEMATOCRIT: 36.7 % — AB (ref 39.0–52.0)
HEMOGLOBIN: 13.5 g/dL (ref 13.0–17.0)
HEMOGLOBIN: 12.1 g/dL — AB (ref 13.0–17.0)
MCH: 30 pg (ref 26.0–34.0)
MCH: 30.2 pg (ref 26.0–34.0)
MCHC: 31.7 g/dL (ref 30.0–36.0)
MCHC: 33 g/dL (ref 30.0–36.0)
MCV: 94.7 fL (ref 80.0–100.0)
MCV: 91.5 fL (ref 80.0–100.0)
PLATELETS: 193 10*3/uL (ref 150–400)
Platelets: 173 10*3/uL (ref 150–400)
RBC: 4.5 MIL/uL (ref 4.22–5.81)
RBC: 4.01 MIL/uL — ABNORMAL LOW (ref 4.22–5.81)
RDW: 14 % (ref 11.5–15.5)
RDW: 13.7 % (ref 11.5–15.5)
WBC: 7.5 10*3/uL (ref 4.0–10.5)
WBC: 7.4 10*3/uL (ref 4.0–10.5)
nRBC: 0 % (ref 0.0–0.2)
nRBC: 0 % (ref 0.0–0.2)

## 2018-03-13 LAB — PROTIME-INR
INR: 1.08
Prothrombin Time: 13.9 seconds (ref 11.4–15.2)

## 2018-03-13 LAB — I-STAT ARTERIAL BLOOD GAS, ED
BICARBONATE: 26.2 mmol/L (ref 20.0–28.0)
O2 Saturation: 100 %
PH ART: 7.345 — AB (ref 7.350–7.450)
Patient temperature: 98.6
TCO2: 28 mmol/L (ref 22–32)
pCO2 arterial: 47.9 mmHg (ref 32.0–48.0)
pO2, Arterial: 568 mmHg — ABNORMAL HIGH (ref 83.0–108.0)

## 2018-03-13 LAB — BLOOD PRODUCT ORDER (VERBAL) VERIFICATION

## 2018-03-13 LAB — POCT I-STAT, CHEM 8
BUN: 8 mg/dL (ref 6–20)
CREATININE: 1.4 mg/dL — AB (ref 0.61–1.24)
Calcium, Ion: 0.97 mmol/L — ABNORMAL LOW (ref 1.15–1.40)
Chloride: 106 mmol/L (ref 98–111)
GLUCOSE: 126 mg/dL — AB (ref 70–99)
HEMATOCRIT: 43 % (ref 39.0–52.0)
HEMOGLOBIN: 14.6 g/dL (ref 13.0–17.0)
POTASSIUM: 3.9 mmol/L (ref 3.5–5.1)
SODIUM: 144 mmol/L (ref 135–145)
TCO2: 26 mmol/L (ref 22–32)

## 2018-03-13 LAB — TRIGLYCERIDES
Triglycerides: 132 mg/dL (ref <150)
Triglycerides: 231 mg/dL — ABNORMAL HIGH (ref <150)

## 2018-03-13 LAB — LACTIC ACID, PLASMA: Lactic Acid, Venous: 4.3 mmol/L (ref 0.5–1.9)

## 2018-03-13 LAB — MRSA PCR SCREENING: MRSA BY PCR: NEGATIVE

## 2018-03-13 LAB — I-STAT CG4 LACTIC ACID, ED: Lactic Acid, Venous: 3.14 mmol/L (ref 0.5–1.9)

## 2018-03-13 LAB — ETHANOL: ALCOHOL ETHYL (B): 349 mg/dL — AB (ref <10)

## 2018-03-13 MED ORDER — FENTANYL 2500MCG IN NS 250ML (10MCG/ML) PREMIX INFUSION
25.0000 ug/h | INTRAVENOUS | Status: DC
  Administered 2018-03-13: 50 ug/h via INTRAVENOUS
  Administered 2018-03-13 – 2018-03-14 (×3): 200 ug/h via INTRAVENOUS
  Filled 2018-03-13 (×3): qty 250

## 2018-03-13 MED ORDER — ORAL CARE MOUTH RINSE
15.0000 mL | OROMUCOSAL | Status: DC
  Administered 2018-03-13 – 2018-03-14 (×10): 15 mL via OROMUCOSAL

## 2018-03-13 MED ORDER — ETOMIDATE 2 MG/ML IV SOLN
INTRAVENOUS | Status: AC | PRN
  Administered 2018-03-13: 20 mg via INTRAVENOUS

## 2018-03-13 MED ORDER — PROPOFOL 1000 MG/100ML IV EMUL: 5.0000 ug/kg/min | INTRAVENOUS | Status: DC

## 2018-03-13 MED ORDER — FAMOTIDINE IN NACL 20-0.9 MG/50ML-% IV SOLN
20.0000 mg | Freq: Two times a day (BID) | INTRAVENOUS | Status: DC
  Administered 2018-03-13 – 2018-03-15 (×5): 20 mg via INTRAVENOUS
  Filled 2018-03-13 (×5): qty 50

## 2018-03-13 MED ORDER — FENTANYL CITRATE (PF) 100 MCG/2ML IJ SOLN: 50.0000 ug | Freq: Once | INTRAMUSCULAR | Status: DC

## 2018-03-13 MED ORDER — ONDANSETRON HCL 4 MG/2ML IJ SOLN: 4.0000 mg | Freq: Four times a day (QID) | INTRAMUSCULAR | Status: DC | PRN

## 2018-03-13 MED ORDER — IOHEXOL 300 MG/ML  SOLN
100.0000 mL | Freq: Once | INTRAMUSCULAR | Status: AC | PRN
  Administered 2018-03-13: 100 mL via INTRAVENOUS

## 2018-03-13 MED ORDER — ACETAMINOPHEN 650 MG RE SUPP: 650.0000 mg | RECTAL | Status: DC | PRN

## 2018-03-13 MED ORDER — HYDRALAZINE HCL 20 MG/ML IJ SOLN: 10.0000 mg | INTRAMUSCULAR | Status: DC | PRN

## 2018-03-13 MED ORDER — PROPOFOL 1000 MG/100ML IV EMUL
0.0000 ug/kg/min | INTRAVENOUS | Status: DC
  Administered 2018-03-13 (×2): 40 ug/kg/min via INTRAVENOUS
  Administered 2018-03-14: 20 ug/kg/min via INTRAVENOUS
  Filled 2018-03-13 (×4): qty 100

## 2018-03-13 MED ORDER — BISACODYL 10 MG RE SUPP: 10.0000 mg | Freq: Every day | RECTAL | Status: DC | PRN

## 2018-03-13 MED ORDER — MIDAZOLAM HCL 5 MG/5ML IJ SOLN
INTRAMUSCULAR | Status: AC | PRN
  Administered 2018-03-13 (×2): 2 mg via INTRAVENOUS

## 2018-03-13 MED ORDER — SUCCINYLCHOLINE CHLORIDE 20 MG/ML IJ SOLN
INTRAMUSCULAR | Status: AC | PRN
  Administered 2018-03-13: 100 mg via INTRAVENOUS

## 2018-03-13 MED ORDER — DEXMEDETOMIDINE HCL IN NACL 400 MCG/100ML IV SOLN
0.4000 ug/kg/h | INTRAVENOUS | Status: DC
  Administered 2018-03-13: 0.6 ug/kg/h via INTRAVENOUS
  Administered 2018-03-13: 0.4 ug/kg/h via INTRAVENOUS
  Administered 2018-03-14: 0.6 ug/kg/h via INTRAVENOUS
  Administered 2018-03-14: 1 ug/kg/h via INTRAVENOUS
  Administered 2018-03-14: 0.8 ug/kg/h via INTRAVENOUS
  Administered 2018-03-14: 1.2 ug/kg/h via INTRAVENOUS
  Administered 2018-03-15: 0.4 ug/kg/h via INTRAVENOUS
  Filled 2018-03-13 (×3): qty 100
  Filled 2018-03-13: qty 200
  Filled 2018-03-13 (×3): qty 100

## 2018-03-13 MED ORDER — SODIUM CHLORIDE 0.9 % IV SOLN
INTRAVENOUS | Status: DC
  Administered 2018-03-13 – 2018-03-14 (×5): via INTRAVENOUS

## 2018-03-13 MED ORDER — PROPOFOL 1000 MG/100ML IV EMUL
INTRAVENOUS | Status: AC
  Filled 2018-03-13: qty 100

## 2018-03-13 MED ORDER — FENTANYL BOLUS VIA INFUSION
50.0000 ug | INTRAVENOUS | Status: DC | PRN
  Filled 2018-03-13: qty 50

## 2018-03-13 MED ORDER — FENTANYL CITRATE (PF) 100 MCG/2ML IJ SOLN
INTRAMUSCULAR | Status: AC | PRN
  Administered 2018-03-13 (×3): 50 ug via INTRAVENOUS

## 2018-03-13 MED ORDER — CHLORHEXIDINE GLUCONATE 0.12% ORAL RINSE (MEDLINE KIT)
15.0000 mL | Freq: Two times a day (BID) | OROMUCOSAL | Status: DC
  Administered 2018-03-13 – 2018-03-14 (×3): 15 mL via OROMUCOSAL

## 2018-03-13 MED ORDER — ONDANSETRON 4 MG PO TBDP: 4.0000 mg | ORAL_TABLET | Freq: Four times a day (QID) | ORAL | Status: DC | PRN

## 2018-03-13 MED ORDER — PROPOFOL 1000 MG/100ML IV EMUL
5.0000 ug/kg/min | INTRAVENOUS | Status: DC
  Administered 2018-03-13: 8.4 ug/kg/min via INTRAVENOUS
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
## 2018-03-13 NOTE — Progress Notes (Signed)
Pt transported from ED to 4N27 without incidence.
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
## 2018-03-13 NOTE — ED Notes (Addendum)
Pt attempting to sit up, blood noted in OG tube.
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
## 2018-03-14 ENCOUNTER — Inpatient Hospital Stay (HOSPITAL_COMMUNITY): Payer: BLUE CROSS/BLUE SHIELD

## 2018-03-14 LAB — ABO/RH: ABO/RH(D): B POS

## 2018-03-14 MED ORDER — VITAMIN B-1 100 MG PO TABS
100.0000 mg | ORAL_TABLET | Freq: Every day | ORAL | Status: DC
  Administered 2018-03-14 – 2018-03-18 (×4): 100 mg via ORAL
  Filled 2018-03-14 (×5): qty 1

## 2018-03-14 MED ORDER — ADULT MULTIVITAMIN W/MINERALS CH
1.0000 | ORAL_TABLET | Freq: Every day | ORAL | Status: DC
  Administered 2018-03-14 – 2018-03-17 (×4): 1 via ORAL
  Filled 2018-03-14 (×4): qty 1

## 2018-03-14 MED ORDER — CLONAZEPAM 1 MG PO TABS
1.0000 mg | ORAL_TABLET | Freq: Three times a day (TID) | ORAL | Status: DC
  Administered 2018-03-14 – 2018-03-17 (×10): 1 mg via ORAL
  Filled 2018-03-14 (×10): qty 1

## 2018-03-14 MED ORDER — OXYCODONE HCL 5 MG PO TABS
5.0000 mg | ORAL_TABLET | ORAL | Status: DC | PRN
  Administered 2018-03-14: 5 mg via ORAL
  Administered 2018-03-15: 10 mg via ORAL
  Administered 2018-03-18: 5 mg via ORAL
  Filled 2018-03-14: qty 1
  Filled 2018-03-14 (×2): qty 2
  Filled 2018-03-14: qty 1

## 2018-03-14 MED ORDER — POTASSIUM CHLORIDE 10 MEQ/100ML IV SOLN
10.0000 meq | INTRAVENOUS | Status: AC
  Administered 2018-03-14 (×4): 10 meq via INTRAVENOUS
  Filled 2018-03-14 (×4): qty 100

## 2018-03-14 MED ORDER — LORAZEPAM 2 MG/ML IJ SOLN
INTRAMUSCULAR | Status: AC
  Filled 2018-03-14: qty 1

## 2018-03-14 MED ORDER — HYDROMORPHONE HCL 1 MG/ML IJ SOLN
0.5000 mg | INTRAMUSCULAR | Status: DC | PRN
  Administered 2018-03-16: 1 mg via INTRAVENOUS
  Filled 2018-03-14: qty 1

## 2018-03-14 MED ORDER — QUETIAPINE FUMARATE 50 MG PO TABS
200.0000 mg | ORAL_TABLET | Freq: Two times a day (BID) | ORAL | Status: DC
  Administered 2018-03-14 – 2018-03-18 (×8): 200 mg via ORAL
  Filled 2018-03-14 (×3): qty 1
  Filled 2018-03-14: qty 4
  Filled 2018-03-14 (×6): qty 1

## 2018-03-14 MED ORDER — OXYCODONE HCL 5 MG PO TABS: 10.0000 mg | ORAL_TABLET | ORAL | Status: DC | PRN

## 2018-03-14 MED ORDER — HALOPERIDOL LACTATE 5 MG/ML IJ SOLN: 5.0000 mg | Freq: Four times a day (QID) | INTRAMUSCULAR | Status: DC | PRN

## 2018-03-14 MED ORDER — LORAZEPAM 2 MG/ML IJ SOLN
1.0000 mg | INTRAMUSCULAR | Status: DC | PRN
  Administered 2018-03-14: 2 mg via INTRAVENOUS
  Administered 2018-03-15: 1 mg via INTRAVENOUS
  Filled 2018-03-14 (×2): qty 1

## 2018-03-14 MED ORDER — ACETAMINOPHEN 325 MG PO TABS
650.0000 mg | ORAL_TABLET | Freq: Four times a day (QID) | ORAL | Status: DC
  Administered 2018-03-14 – 2018-03-17 (×11): 650 mg via ORAL
  Filled 2018-03-14 (×12): qty 2

## 2018-03-14 MED ORDER — FOLIC ACID 1 MG PO TABS
1.0000 mg | ORAL_TABLET | Freq: Every day | ORAL | Status: DC
  Administered 2018-03-15 – 2018-03-17 (×3): 1 mg via ORAL
  Filled 2018-03-14 (×3): qty 1
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
## 2018-03-14 NOTE — Procedures (Signed)
Extubation Procedure Note  Patient Details:   Name: Bradley Cummings DOB: Dec 28, 1990 MRN: [id number]   Airway Documentation:    Vent end date: 03/14/18 Vent end time: 1109   Evaluation  O2 sats: stable throughout Complications: No apparent complications Patient did tolerate procedure well. Bilateral Breath Sounds: Clear, Diminished   Yes   Pt extubated to 2L N/C.  No stridor noted.  RN @ bedside.  Christophe Louis 03/14/2018, 11:11 AM
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
## 2018-03-14 NOTE — Progress Notes (Signed)
Patient ID: Bradley Cummings, male   DOB: Sep 30, 1990, 27 y.o.   MRN: [id number] BP (!) 147/98   Pulse (!) 106   Temp 99.3 F (37.4 C) (Axillary)   Resp 18   Ht 5\' 10"  (1.778 m)   Wt 68 kg   SpO2 100%   BMI 21.52 kg/m  Alert, confused. Conversant. Impulsive moving all extremities vigorously. Pupils equal round and reactive to light  Obviously improved. Will continue to improve.

## 2018-03-14 NOTE — Progress Notes (Signed)
RT transported pt on vent to CT to and from 4N 27. Transport was uneventful and without complication. RT will continue to monitor.

## 2018-03-14 NOTE — Progress Notes (Signed)
Trauma MD paged second time to discuss placing patient back on CIWA. Will continue to monitor.

## 2018-03-14 NOTE — Progress Notes (Signed)
100cc of fentanyl gtt was wasted down the sink and witnessed by Loletta Specter, Charity fundraiser.

## 2018-03-15 LAB — BASIC METABOLIC PANEL WITH GFR
Anion gap: 10 (ref 5–15)
BUN: <5 mg/dL — ABNORMAL LOW (ref 6–20)
CO2: 23 mmol/L (ref 22–32)
Calcium: 8.9 mg/dL (ref 8.9–10.3)
Chloride: 107 mmol/L (ref 98–111)
Creatinine, Ser: 0.74 mg/dL (ref 0.61–1.24)
GFR calc Af Amer: >60 mL/min
GFR calc non Af Amer: >60 mL/min
Glucose, Bld: 105 mg/dL — ABNORMAL HIGH (ref 70–99)
Potassium: 3.4 mmol/L — ABNORMAL LOW (ref 3.5–5.1)
Sodium: 140 mmol/L (ref 135–145)

## 2018-03-15 MED ORDER — POTASSIUM CHLORIDE 20 MEQ/15ML (10%) PO SOLN
40.0000 meq | Freq: Once | ORAL | Status: AC
  Administered 2018-03-15: 40 meq via ORAL
  Filled 2018-03-15: qty 30

## 2018-03-15 MED ORDER — LORAZEPAM 2 MG/ML IJ SOLN
1.0000 mg | INTRAMUSCULAR | Status: DC | PRN
  Administered 2018-03-15 – 2018-03-16 (×3): 1 mg via INTRAVENOUS
  Filled 2018-03-15 (×4): qty 1

## 2018-03-15 MED ORDER — LORAZEPAM 1 MG PO TABS
1.0000 mg | ORAL_TABLET | ORAL | Status: DC | PRN
  Filled 2018-03-15: qty 2

## 2018-03-15 MED ORDER — LORAZEPAM 2 MG/ML IJ SOLN: 1.0000 mg | INTRAMUSCULAR | Status: DC | PRN
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
## 2018-03-15 NOTE — Progress Notes (Signed)
Patient ID: Bradley Cummings, male   DOB: 03-30-1991, 27 y.o.   MRN: [id number] BP 130/90 (BP Location: Right Arm)   Pulse (!) 113   Temp 98 F (36.7 C) (Oral)   Resp 20   Ht 5\' 10"  (1.778 m)   Wt 68 kg   SpO2 100%   BMI 21.52 kg/m  Alert, confused. Speech is clear , fluent, making sense at times. Moving all extremities well No new recommendations, expect improvement.
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
## 2018-03-15 NOTE — Progress Notes (Signed)
SLP Cancellation Note  Patient Details Name: Bradley Cummings MRN: [id number] DOB: 1991-04-12   Cancelled treatment:       Reason Eval/Treat Not Completed: Fatigue/lethargy limiting ability to participate. Per RN, pt received sedating meds earlier today, and is currently insufficiently arousable for reliable evaluation of cognition. Will continue efforts next date. SLP met with pt's father and informed him of this plan.  Lorilynn Lehr B. Quentin Ore [facility], CCC-SLP Speech Language Pathologist [phone]  Shonna Chock 03/15/2018, 1:29 PM

## 2018-03-15 NOTE — Progress Notes (Signed)
Inpatient Rehabilitation  Per PT and OT request, patient was screened by Fae Pippin for appropriateness for an Inpatient Acute Rehab consult.  At this time we areplanning to follow along for ability to particiapte in further assessment and determination of a Rancho Level.  Call if questions.  Charlane Ferretti., CCC/SLP Admission Coordinator  [facility]  Cell [phone]

## 2018-03-16 LAB — CBC
HCT: 35.3 % — ABNORMAL LOW (ref 39.0–52.0)
HEMOGLOBIN: 11.4 g/dL — AB (ref 13.0–17.0)
MCH: 29.4 pg (ref 26.0–34.0)
MCHC: 32.3 g/dL (ref 30.0–36.0)
MCV: 91 fL (ref 80.0–100.0)
NRBC: 0 % (ref 0.0–0.2)
Platelets: 143 10*3/uL — ABNORMAL LOW (ref 150–400)
RBC: 3.88 MIL/uL — ABNORMAL LOW (ref 4.22–5.81)
RDW: 12.3 % (ref 11.5–15.5)
WBC: 6.8 10*3/uL (ref 4.0–10.5)

## 2018-03-16 LAB — BASIC METABOLIC PANEL
Anion gap: 12 (ref 5–15)
CHLORIDE: 104 mmol/L (ref 98–111)
CO2: 24 mmol/L (ref 22–32)
Calcium: 9 mg/dL (ref 8.9–10.3)
Creatinine, Ser: 0.68 mg/dL (ref 0.61–1.24)
GFR calc non Af Amer: >60 mL/min (ref >60)
Glucose, Bld: 98 mg/dL (ref 70–99)
POTASSIUM: 3.1 mmol/L — AB (ref 3.5–5.1)
SODIUM: 140 mmol/L (ref 135–145)

## 2018-03-16 MED ORDER — NICOTINE 14 MG/24HR TD PT24
14.0000 mg | MEDICATED_PATCH | Freq: Every day | TRANSDERMAL | Status: DC
  Filled 2018-03-16: qty 1

## 2018-03-16 MED ORDER — FAMOTIDINE 20 MG PO TABS
20.0000 mg | ORAL_TABLET | Freq: Two times a day (BID) | ORAL | Status: DC
  Administered 2018-03-16 – 2018-03-18 (×5): 20 mg via ORAL
  Filled 2018-03-16 (×5): qty 1

## 2018-03-16 MED ORDER — NICOTINE 21 MG/24HR TD PT24
21.0000 mg | MEDICATED_PATCH | Freq: Every day | TRANSDERMAL | Status: DC
  Administered 2018-03-16 – 2018-03-18 (×3): 21 mg via TRANSDERMAL
  Filled 2018-03-16 (×3): qty 1

## 2018-03-16 MED ORDER — POTASSIUM CHLORIDE 20 MEQ PO PACK
40.0000 meq | PACK | Freq: Two times a day (BID) | ORAL | Status: AC
  Administered 2018-03-16 (×2): 40 meq via ORAL
  Filled 2018-03-16 (×2): qty 2
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
## 2018-03-16 NOTE — Progress Notes (Signed)
PT/OT recommeded enclosure bed in order to prevent over sedation and prolonged wrist / posey restraint use. Explanation given to family on benefits of enclosure bed, family seems hesitant but eventually agrees.   Dr. Corliss Skains called and asked if patient was okay to come off of telemetry due to it not being allowed in an enclosured bed. MD states he does not know patent well but due to reported non-cardiac concerns he agreed. MD also states patient is to remain in ICU  today.   Vital signs changed to Q4
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
## 2018-03-16 NOTE — Progress Notes (Signed)
Patient ID: Bradley Cummings, male   DOB: 1990/08/15, 27 y.o.   MRN: [id number] BP 111/78 (BP Location: Left Arm)   Pulse 93   Temp 98.1 F (36.7 C) (Oral)   Resp 20   Ht 5\' 10"  (1.778 m)   Wt 68 kg   SpO2 98%   BMI 21.52 kg/m  Alert, confused. Will follow some commands Will sign off, but call if there are questions.
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
## 2018-03-16 NOTE — Progress Notes (Signed)
PHARMACIST - PHYSICIAN COMMUNICATION  DR:   Janee Morn  CONCERNING: IV to Oral Route Change Policy  RECOMMENDATION: This patient is receiving famotidine by the intravenous route.  Based on criteria approved by the Pharmacy and Therapeutics Committee, the intravenous medication(s) is/are being converted to the equivalent oral dose form(s).   DESCRIPTION: These criteria include:  The patient is eating (either orally or via tube) and/or has been taking other orally administered medications for a least 24 hours  The patient has no evidence of active gastrointestinal bleeding or impaired GI absorption (gastrectomy, short bowel, patient on TNA or NPO).  If you have questions about this conversion, please contact the Pharmacy Department  []   [phone] )  [facility] []   [phone] )  [facility] [x]   [phone] )  [facility] []   [phone] )  [facility] []   [phone] )  [facility]   Tera Mater, [facility] 03/16/2018 8:18 AM

## 2018-03-16 NOTE — Progress Notes (Addendum)
Spoke to Trauma MD about patient's high heart rate. It is most likely from restlessness, anxiety, and alcohol withdrawal, but I was concerned about giving ativan on top of his scheduled klonopin and seroquel. Patient got very sleepy and less responsive after the combination of klonopin and seroquel last night, and day shift nurse reported the same thing. Trauma MD said to keep ativan ready if needed. Orders received to draw a CBC. Will continue to monitor.
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
## 2018-03-17 DIAGNOSIS — S066X3S Traumatic subarachnoid hemorrhage with loss of consciousness of 1 hour to 5 hours 59 minutes, sequela: Secondary | ICD-10-CM

## 2018-03-17 LAB — BASIC METABOLIC PANEL
Anion gap: 9 (ref 5–15)
BUN: <5 mg/dL — ABNORMAL LOW (ref 6–20)
CHLORIDE: 105 mmol/L (ref 98–111)
CO2: 24 mmol/L (ref 22–32)
CREATININE: 0.82 mg/dL (ref 0.61–1.24)
Calcium: 9 mg/dL (ref 8.9–10.3)
GFR calc non Af Amer: >60 mL/min (ref >60)
GLUCOSE: 105 mg/dL — AB (ref 70–99)
Potassium: 3.9 mmol/L (ref 3.5–5.1)
Sodium: 138 mmol/L (ref 135–145)

## 2018-03-17 MED ORDER — THIAMINE HCL 100 MG/ML IJ SOLN: 100.0000 mg | Freq: Every day | INTRAMUSCULAR | Status: DC

## 2018-03-17 MED ORDER — LORAZEPAM 1 MG PO TABS: 1.0000 mg | ORAL_TABLET | Freq: Four times a day (QID) | ORAL | Status: DC | PRN

## 2018-03-17 MED ORDER — INFLUENZA VAC SPLIT QUAD 0.5 ML IM SUSY: 0.5000 mL | PREFILLED_SYRINGE | INTRAMUSCULAR | Status: DC

## 2018-03-17 MED ORDER — VITAMIN B-1 100 MG PO TABS: 100.0000 mg | ORAL_TABLET | Freq: Every day | ORAL | Status: DC

## 2018-03-17 MED ORDER — LORAZEPAM 2 MG/ML IJ SOLN: 1.0000 mg | Freq: Four times a day (QID) | INTRAMUSCULAR | Status: DC | PRN

## 2018-03-17 MED ORDER — DOCUSATE SODIUM 100 MG PO CAPS
100.0000 mg | ORAL_CAPSULE | Freq: Two times a day (BID) | ORAL | Status: DC
  Administered 2018-03-17 – 2018-03-18 (×2): 100 mg via ORAL
  Filled 2018-03-17 (×2): qty 1

## 2018-03-17 MED ORDER — LORAZEPAM 2 MG/ML IJ SOLN: 0.0000 mg | Freq: Four times a day (QID) | INTRAMUSCULAR | Status: DC

## 2018-03-17 MED ORDER — ENOXAPARIN SODIUM 40 MG/0.4ML ~~LOC~~ SOLN
40.0000 mg | Freq: Every day | SUBCUTANEOUS | Status: DC
  Administered 2018-03-17 – 2018-03-18 (×2): 40 mg via SUBCUTANEOUS
  Filled 2018-03-17 (×2): qty 0.4

## 2018-03-17 MED ORDER — LORAZEPAM 2 MG/ML IJ SOLN: 0.0000 mg | Freq: Two times a day (BID) | INTRAMUSCULAR | Status: DC

## 2018-03-17 MED ORDER — FOLIC ACID 1 MG PO TABS
1.0000 mg | ORAL_TABLET | Freq: Every day | ORAL | Status: DC
  Administered 2018-03-18: 1 mg via ORAL
  Filled 2018-03-17 (×2): qty 1

## 2018-03-17 MED ORDER — PNEUMOCOCCAL VAC POLYVALENT 25 MCG/0.5ML IJ INJ: 0.5000 mL | INJECTION | INTRAMUSCULAR | Status: DC

## 2018-03-17 MED ORDER — ADULT MULTIVITAMIN W/MINERALS CH
1.0000 | ORAL_TABLET | Freq: Every day | ORAL | Status: DC
  Administered 2018-03-18: 1 via ORAL
  Filled 2018-03-17: qty 1
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
## 2018-03-17 NOTE — Progress Notes (Signed)
Inpatient Rehabilitation-Admissions Coordinator   Spoke with pt and his brother at the bedside. Pt remained under the covers throughout most of the conversation. Pt did give AC permission to contact his father to relay information from conversation regarding CIR. AC provided pt with brochures and discussed estimated LOS, anticipated functional level at DC, and expectations for the program. [facility] contacted pt's father who wants to discuss with his wife and plans to meet with [facility] at the hospital at 4:30PM to continue discussion regarding CIR.   Will follow up once decision has been made.  Nanine Means, OTR/L  Rehab Admissions Coordinator  [phone] 03/17/2018 1:03 PM
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
## 2018-03-17 NOTE — Progress Notes (Signed)
Patient arrived in the unit at 2220 pm in a vail bed escorted by 4N's nurse, pt's sister also was presented, pt is alert and oriented and c/o feeling headache and has  already  received prn med for it less than hour ago,  will closely monitor, call bell and personal belongings are within reach, and will continue to monitor.

## 2018-03-18 ENCOUNTER — Inpatient Hospital Stay (HOSPITAL_COMMUNITY)
Admission: RE | Admit: 2018-03-18 | Discharge: 2018-03-23 | DRG: 946 | Disposition: A | Discharge status: Home / Self Care | Payer: BLUE CROSS/BLUE SHIELD | Source: Intra-hospital | Attending: Physical Medicine & Rehabilitation | Admitting: Physical Medicine & Rehabilitation

## 2018-03-18 ENCOUNTER — Encounter (HOSPITAL_COMMUNITY): Payer: Self-pay

## 2018-03-18 ENCOUNTER — Other Ambulatory Visit: Payer: Self-pay

## 2018-03-18 ENCOUNTER — Inpatient Hospital Stay (HOSPITAL_COMMUNITY): Admission: RE | Admit: 2018-03-18 | Payer: Self-pay | Source: Intra-hospital

## 2018-03-18 DIAGNOSIS — S42102A Fracture of unspecified part of scapula, left shoulder, initial encounter for closed fracture: Secondary | ICD-10-CM

## 2018-03-18 DIAGNOSIS — I609 Nontraumatic subarachnoid hemorrhage, unspecified: Secondary | ICD-10-CM | POA: Diagnosis not present

## 2018-03-18 DIAGNOSIS — Z7141 Alcohol abuse counseling and surveillance of alcoholic: Secondary | ICD-10-CM

## 2018-03-18 DIAGNOSIS — S27329D Contusion of lung, unspecified, subsequent encounter: Secondary | ICD-10-CM

## 2018-03-18 DIAGNOSIS — S2232XD Fracture of one rib, left side, subsequent encounter for fracture with routine healing: Secondary | ICD-10-CM | POA: Diagnosis not present

## 2018-03-18 DIAGNOSIS — S42102S Fracture of unspecified part of scapula, left shoulder, sequela: Secondary | ICD-10-CM | POA: Diagnosis not present

## 2018-03-18 DIAGNOSIS — R5381 Other malaise: Secondary | ICD-10-CM | POA: Diagnosis present

## 2018-03-18 DIAGNOSIS — S06303S Unspecified focal traumatic brain injury with loss of consciousness of 1 hour to 5 hours 59 minutes, sequela: Secondary | ICD-10-CM | POA: Diagnosis not present

## 2018-03-18 DIAGNOSIS — F1010 Alcohol abuse, uncomplicated: Secondary | ICD-10-CM

## 2018-03-18 DIAGNOSIS — Z716 Tobacco abuse counseling: Secondary | ICD-10-CM | POA: Diagnosis not present

## 2018-03-18 DIAGNOSIS — S2232XA Fracture of one rib, left side, initial encounter for closed fracture: Secondary | ICD-10-CM

## 2018-03-18 DIAGNOSIS — G44309 Post-traumatic headache, unspecified, not intractable: Secondary | ICD-10-CM

## 2018-03-18 DIAGNOSIS — S066X9D Traumatic subarachnoid hemorrhage with loss of consciousness of unspecified duration, subsequent encounter: Secondary | ICD-10-CM | POA: Diagnosis present

## 2018-03-18 DIAGNOSIS — S42102D Fracture of unspecified part of scapula, left shoulder, subsequent encounter for fracture with routine healing: Secondary | ICD-10-CM | POA: Diagnosis not present

## 2018-03-18 DIAGNOSIS — R451 Restlessness and agitation: Secondary | ICD-10-CM | POA: Diagnosis not present

## 2018-03-18 DIAGNOSIS — F1020 Alcohol dependence, uncomplicated: Secondary | ICD-10-CM | POA: Diagnosis present

## 2018-03-18 DIAGNOSIS — Z87891 Personal history of nicotine dependence: Secondary | ICD-10-CM | POA: Diagnosis not present

## 2018-03-18 DIAGNOSIS — S065X9A Traumatic subdural hemorrhage with loss of consciousness of unspecified duration, initial encounter: Secondary | ICD-10-CM | POA: Insufficient documentation

## 2018-03-18 DIAGNOSIS — G44319 Acute post-traumatic headache, not intractable: Secondary | ICD-10-CM | POA: Diagnosis not present

## 2018-03-18 DIAGNOSIS — S065XAA Traumatic subdural hemorrhage with loss of consciousness status unknown, initial encounter: Secondary | ICD-10-CM | POA: Insufficient documentation

## 2018-03-18 DIAGNOSIS — S42192D Fracture of other part of scapula, left shoulder, subsequent encounter for fracture with routine healing: Secondary | ICD-10-CM | POA: Diagnosis not present

## 2018-03-18 DIAGNOSIS — S065X3S Traumatic subdural hemorrhage with loss of consciousness of 1 hour to 5 hours 59 minutes, sequela: Secondary | ICD-10-CM

## 2018-03-18 LAB — BASIC METABOLIC PANEL
ANION GAP: 11 (ref 5–15)
BUN: <5 mg/dL — ABNORMAL LOW (ref 6–20)
CALCIUM: 9.5 mg/dL (ref 8.9–10.3)
CO2: 25 mmol/L (ref 22–32)
Chloride: 101 mmol/L (ref 98–111)
Creatinine, Ser: 0.82 mg/dL (ref 0.61–1.24)
GFR calc Af Amer: >60 mL/min (ref >60)
GFR calc non Af Amer: >60 mL/min (ref >60)
Glucose, Bld: 124 mg/dL — ABNORMAL HIGH (ref 70–99)
Potassium: 3.6 mmol/L (ref 3.5–5.1)
Sodium: 137 mmol/L (ref 135–145)

## 2018-03-18 LAB — CBC
HCT: 37.9 % — ABNORMAL LOW (ref 39.0–52.0)
HCT: 36.2 % — ABNORMAL LOW (ref 39.0–52.0)
HEMOGLOBIN: 11.7 g/dL — AB (ref 13.0–17.0)
Hemoglobin: 12 g/dL — ABNORMAL LOW (ref 13.0–17.0)
MCH: 29.3 pg (ref 26.0–34.0)
MCH: 29.5 pg (ref 26.0–34.0)
MCHC: 31.7 g/dL (ref 30.0–36.0)
MCHC: 32.3 g/dL (ref 30.0–36.0)
MCV: 92.4 fL (ref 80.0–100.0)
MCV: 91.4 fL (ref 80.0–100.0)
NRBC: 0 % (ref 0.0–0.2)
Platelets: 214 10*3/uL (ref 150–400)
Platelets: 209 10*3/uL (ref 150–400)
RBC: 4.1 MIL/uL — ABNORMAL LOW (ref 4.22–5.81)
RBC: 3.96 MIL/uL — ABNORMAL LOW (ref 4.22–5.81)
RDW: 12.6 % (ref 11.5–15.5)
RDW: 12.3 % (ref 11.5–15.5)
WBC: 5.6 10*3/uL (ref 4.0–10.5)
WBC: 6.3 10*3/uL (ref 4.0–10.5)
nRBC: 0 % (ref 0.0–0.2)

## 2018-03-18 LAB — CREATININE, SERUM
CREATININE: 0.78 mg/dL (ref 0.61–1.24)
GFR calc Af Amer: >60 mL/min (ref >60)

## 2018-03-18 MED ORDER — VITAMIN B-1 100 MG PO TABS
100.0000 mg | ORAL_TABLET | Freq: Every day | ORAL | Status: DC
  Administered 2018-03-19 – 2018-03-23 (×5): 100 mg via ORAL
  Filled 2018-03-18 (×5): qty 1

## 2018-03-18 MED ORDER — BISACODYL 10 MG RE SUPP: 10.0000 mg | Freq: Every day | RECTAL | Status: DC | PRN

## 2018-03-18 MED ORDER — ADULT MULTIVITAMIN W/MINERALS CH
1.0000 | ORAL_TABLET | Freq: Every day | ORAL | Status: DC
  Administered 2018-03-19 – 2018-03-23 (×5): 1 via ORAL
  Filled 2018-03-18 (×5): qty 1

## 2018-03-18 MED ORDER — DOCUSATE SODIUM 100 MG PO CAPS
100.0000 mg | ORAL_CAPSULE | Freq: Two times a day (BID) | ORAL | Status: DC
  Administered 2018-03-18 – 2018-03-23 (×10): 100 mg via ORAL
  Filled 2018-03-18 (×10): qty 1

## 2018-03-18 MED ORDER — CLONAZEPAM 0.5 MG PO TABS
0.5000 mg | ORAL_TABLET | Freq: Three times a day (TID) | ORAL | Status: DC
  Administered 2018-03-18 – 2018-03-22 (×11): 0.5 mg via ORAL
  Filled 2018-03-18 (×11): qty 1

## 2018-03-18 MED ORDER — QUETIAPINE FUMARATE 100 MG PO TABS
200.0000 mg | ORAL_TABLET | Freq: Two times a day (BID) | ORAL | Status: DC
Start: 2018-03-18 — End: 2018-03-21
  Administered 2018-03-18 – 2018-03-21 (×6): 200 mg via ORAL
  Filled 2018-03-18 (×6): qty 2

## 2018-03-18 MED ORDER — NICOTINE 21 MG/24HR TD PT24
21.0000 mg | MEDICATED_PATCH | Freq: Every day | TRANSDERMAL | Status: DC
  Administered 2018-03-19 – 2018-03-23 (×5): 21 mg via TRANSDERMAL
  Filled 2018-03-18 (×5): qty 1

## 2018-03-18 MED ORDER — CLONAZEPAM 0.5 MG PO TABS
0.5000 mg | ORAL_TABLET | Freq: Three times a day (TID) | ORAL | Status: DC
  Administered 2018-03-18 (×2): 0.5 mg via ORAL
  Filled 2018-03-18 (×2): qty 1

## 2018-03-18 MED ORDER — ONDANSETRON 4 MG PO TBDP
4.0000 mg | ORAL_TABLET | Freq: Four times a day (QID) | ORAL | Status: DC | PRN
  Filled 2018-03-18: qty 1

## 2018-03-18 MED ORDER — FOLIC ACID 1 MG PO TABS
1.0000 mg | ORAL_TABLET | Freq: Every day | ORAL | Status: DC
  Administered 2018-03-19 – 2018-03-23 (×5): 1 mg via ORAL
  Filled 2018-03-18 (×5): qty 1

## 2018-03-18 MED ORDER — ENOXAPARIN SODIUM 40 MG/0.4ML ~~LOC~~ SOLN
40.0000 mg | Freq: Every day | SUBCUTANEOUS | Status: DC
  Administered 2018-03-19 – 2018-03-22 (×4): 40 mg via SUBCUTANEOUS
  Filled 2018-03-18 (×4): qty 0.4

## 2018-03-18 MED ORDER — ENOXAPARIN SODIUM 40 MG/0.4ML ~~LOC~~ SOLN: 40.0000 mg | SUBCUTANEOUS | Status: DC

## 2018-03-18 MED ORDER — ACETAMINOPHEN 325 MG PO TABS
650.0000 mg | ORAL_TABLET | Freq: Four times a day (QID) | ORAL | Status: DC | PRN
  Administered 2018-03-19 – 2018-03-21 (×5): 650 mg via ORAL
  Filled 2018-03-18 (×5): qty 2

## 2018-03-18 MED ORDER — FAMOTIDINE 20 MG PO TABS
20.0000 mg | ORAL_TABLET | Freq: Two times a day (BID) | ORAL | Status: DC
  Administered 2018-03-18 – 2018-03-23 (×10): 20 mg via ORAL
  Filled 2018-03-18 (×10): qty 1

## 2018-03-18 MED ORDER — ACETAMINOPHEN 325 MG PO TABS: 650.0000 mg | ORAL_TABLET | Freq: Four times a day (QID) | ORAL | Status: DC | PRN

## 2018-03-18 MED ORDER — IBUPROFEN 200 MG PO TABS: 800.0000 mg | ORAL_TABLET | Freq: Three times a day (TID) | ORAL | Status: DC | PRN

## 2018-03-18 MED ORDER — ONDANSETRON HCL 4 MG/2ML IJ SOLN: 4.0000 mg | Freq: Four times a day (QID) | INTRAMUSCULAR | Status: DC | PRN
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
## 2018-03-18 NOTE — Progress Notes (Signed)
Patient ID: Bradley Cummings, male   DOB: 1990-08-04, 27 y.o.   MRN: [id number]  Pt admitted to 4W05 accompanied by family, friends and RN. Pt oriented to rehab floor and fall policy. Pt does not seem to be in any distress at this time. Family and friends at bedside. Call bell and personal belongings within reach. No further questions at this time. Continue plan of care.  Mika Griffitts W Surah Pelley
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
## 2018-03-18 NOTE — Progress Notes (Signed)
PT Cancellation Note  Patient Details Name: Bradley Cummings MRN: [id number] DOB: 1990-10-30   Cancelled Treatment:    Reason Eval/Treat Not Completed: Patient declined, no reason specified Patient and family request for pt to finish lunch at this time. PT will continue to follow acutely.   Erline Levine, PTA Acute Rehabilitation Services Pager: [phone] Office: [phone]     Carolynne Edouard 03/18/2018, 2:56 PM

## 2018-03-18 NOTE — Progress Notes (Signed)
Inpatient Rehabilitation-Admissions Coordinator   Pt's father has stated the family has worked out a Higher education careers adviser for supervision at DC and he would like for his son to be admitted to CIR. AC has received insurance approval and medical approval to admit to CIR today. AC has updated pt and family as well as CM/SW and RN.   Please call if questions.   Nanine Means, OTR/L  Rehab Admissions Coordinator  [phone] 03/18/2018 5:56 PM
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
## 2018-03-18 NOTE — Plan of Care (Signed)
Patient stable, discussed POC with patient and family, agreeable with plan, denies question/concerns at this time.  

## 2018-03-18 NOTE — Discharge Instructions (Signed)
Living With Traumatic Brain Injury °Traumatic brain injury (TBI) is an injury to the brain that may be mild, moderate, or severe. Symptoms of any type of TBI can be long lasting (chronic). Depending on the area of the brain that is affected, a TBI can interfere with vision, memory, concentration, speech, balance, sense of touch, and sleep. TBI can also cause chronic symptoms like headache or dizziness. °How to cope with lifestyle changes °After a TBI, you may need to make changes to your lifestyle in order to recover as well as possible. How quickly and how fully you recover will depend on the severity of your injury. Your recovery plan may involve: °· Working with specialists to develop a rehabilitation plan to help you return to your regular activities. Your health care team may include: °? Physical or occupational therapists. °? Speech and language pathologists. °? Mental health counselors. °? Physicians like your primary care physician or neurologist. °· Taking time off work or school, depending on your injury. °· Avoiding situations where there is a risk for another head injury, such as football, hockey, soccer, basketball, martial arts, downhill snow sports, and horseback riding. Do not do these activities until your health care provider approves. °· Resting. Rest helps the brain to heal. Make sure you: °? Get plenty of sleep at night. Avoid staying up late at night. °? Keep the same bedtime hours on weekends and weekdays. °? Rest during the day. Take daytime naps or rest breaks when you feel tired. °· Avoiding extra stress on your eyes. You may need to set time limits when working on the computer, watching TV, and reading. °· Finding ways to manage stress. This may include: °? Avoiding activities that cause stress. °? Deep breathing, yoga, or meditation. °? Listening to music or spending time outdoors. °· Making lists, setting reminders, or using a day planner to help your memory. °· Allowing yourself plenty  of time to complete everyday tasks, such as grocery shopping, paying bills, and doing laundry. °· Avoiding driving. Your ability to drive safely may be affected by your injury. °? Rely on family, friends, or a transportation service to help you get around and to appointments. °? Have a professional evaluation to check your driving ability. °? Access support services to help you return to driving. These may include training and adaptive equipment. ° °Follow these instructions at home: °· Take over-the-counter and prescription medicines only as told by your health care provider. Do not take aspirin or other anti-inflammatory medicines such as ibuprofen or naproxen unless approved by your health care provider. °· Avoid large amounts of caffeine. Your body may be more sensitive to it after your injury. °· Do not use any products that contain nicotine or tobacco, such as cigarettes, e-cigarettes, nicotine gum, and patches. If you need help quitting, ask your health care provider. °· Do not use drugs. °· Limit alcohol intake to no more than 1 drink per day for nonpregnant women and 2 drinks per day for men. One drink equals 12 ounces of beer, 5 ounces of wine, or 1½ ounces of hard liquor. °· Do not drive until cleared by your health care provider. °· Keep all follow-up visits as told by your health care provider. This is important. °Where to find support: °· Talk with your employer, co-workers, teachers, or school counselor about your injury. Work together to develop a plan for completing tasks while you recover. °· Talk to others living with a TBI. Join a support group with other   people who have experienced a TBI. °· Let your friends and family members know what they can do to help. This might include helping at home or transportation to appointments. °· If you are unable to continue working after your injury, talk to a social worker about options to help you meet your financial needs. °· Seek out additional resources if  you are a military serviceman or family member, such as: °? Defense and Veterans Brain Injury Center: dvbic.dcoe.mil °? Department of Veterans Affairs Military and Veterans Crisis Line: 1-800-273-8255 °Questions to ask your health care provider: °· How serious is my injury? °· What is my rehabilitation plan? °· What is my expected recovery? °· When can I return to work or school? °· When can I return to regular activities, including driving? °Contact a health care provider if: °· You have new or worsening: °? Dizziness. °? Headache. °? Anxiety or depression. °? Irritability. °? Confusion. °? Jerky movements that you cannot control (seizures). °? Extreme sensitivity to light or sound. °? Nausea or vomiting. °Summary °· Traumatic brain injury (TBI) is an injury to your brain that can interfere with vision, memory, concentration, speech, balance, sense of touch, and sleep. TBI can also cause chronic symptoms like headache or dizziness. °· After a TBI you may need to make several changes to your lifestyle in order to recover as well as possible. How quickly and how fully you recover will depend on the severity of your injury. °· Talk to your family, friends, employer, co-workers, teachers, or school counselor about your injury. Work together to develop a plan for completing tasks while you recover. °This information is not intended to replace advice given to you by your health care provider. Make sure you discuss any questions you have with your health care provider. °Document Released: 05/14/2016 Document Revised: 05/14/2016 Document Reviewed: 05/14/2016 °Elsevier Interactive Patient Education © 2018 [employer]vier Inc[employer] ° ° °Supporting Someone With Traumatic Brain Injury °Traumatic brain injury (TBI) is an injury to the brain that results from a hard, direct hit to the head. It also results from whiplash or a direct blow to the head by an object. TBI can be mild, moderate, or severe. °What do I need to know about this  condition? °Symptoms of any type of TBI can be long lasting (chronic). Depending on the area of the brain that is affected, a TBI can interfere with vision, memory, concentration, speech, balance, sense of touch, and sleep. TBI can also cause chronic symptoms like headache or dizziness. °A mild TBI is also known as a concussion. A concussion usually involves being knocked-out (losing consciousness) for a short time or not at all, and results in less brain damage. A moderate or severe TBI involves losing consciousness for an extended period of time. This can result in more serious damage to the brain, such as memory loss after the injury. °What do I need to know about my loved one's recovery? °Traumatic brain injuries affect different people in different ways. It is important to understand that recovery is different for each person, and may take time. °Mild injury °Many people with a mild brain injury recover quickly and return to their normal activities and abilities. However, some people may continue to experience problems associated with their brain injury. This can cause frustration, anger, and moodiness for the person and their loved ones, especially if they expected a normal and complete recovery. °Moderate or severe injury °After a moderate or severe injury, there is usually an initial recovery period   that may last up to several weeks. During this time, your loved one may stay in the hospital or a rehabilitation center. Depending on your loved one's injury, age, recovery, and overall health, he or she may: °· Be allowed to return home or enter a long-term care facility. °· Need to continue rehabilitation with occupational, physical, and speech therapists to help restore their mobility and their ability to process information, speak, and do daily activities. °· Have changes to mood and personality. °· Experience changes to sleep, energy levels, appetite, balance, bladder control, and vision. °· Have long-term  health complications, such as seizures, headaches, or chronic pain. °· Need to take an extended time off work or school. Some people are not able to return to these activities after their injury. ° °How can I support my friend or family member? °General help °· Offer assistance with household chores or other daily tasks. Cook or make "freezer meals" that can be reheated. °· Help your loved one establish a daily routine. This may include setting reminders or having a shared day planner or calendar. °· Encourage rest. Your loved one may need frequent breaks during social situations or other activities. °· Be patient. Your loved one may take longer to complete tasks and process information. °· When giving instructions, give only one instruction at a time or make lists. Multitasking can be difficult for a person with a TBI. °· Do not set expectations about your loved one's recovery. °Medical appointments °· Gently remind your loved one of tasks or appointments if he or she is forgetful. °· Provide transportation to and from appointments. °· Attend rehabilitation appointments with your loved one. Help with exercises at home. °Preventing falls °Take steps to prevent falls in your loved one's home. This may include: °· Installing grab bars in the bathroom or handrails on stairs. °· Using night lights in the bedroom, bathroom, or hallways. °· Securing or removing rugs and cords in walkways. ° °Managing finances °· Help manage finances. Talk with a social worker or legal professional if: °? Your loved one is unable to return to work and is in need of financial help. °? You need help paying medical bills. °? You need to establish guardianship over finances. °? You need help with estate planning. °How can I care for myself? °Lifestyle °· Do not use drugs. °· Avoid or limit alcohol use. Limit alcohol intake to no more than 1 drink per day for nonpregnant women and 2 drinks per day for men. One drink equals 12 ounces of beer, 5  ounces of wine, or 1½ ounces of hard liquor. °· Rest. Try to get 7-9 hours of uninterrupted sleep each night. °· Eat a balanced diet that includes fresh fruits and vegetables, whole grains, lean proteins, and low-fat dairy. °· Exercise for at least 30 minutes on 5 or more days each week. °· Find ways to manage stress. This may include: °? Deep breathing, yoga, or meditation. °? Spending time outdoors. °? Journaling. °Finding support °· Ask for help. Take a break if you are the primary caregiver to your loved one. °· Spend time with supportive people. °· Join a support group with other caregivers or family members of people with TBI. °· If you experience new or worsening depression or anxiety, seek counseling from a mental health professional. °Where to find more information: °· Learn more about TBI and find support through: °? Brainline: www.brainline.org °? Brain Injury Association of [place]: www.biausa.org °· For military servicemen and their families: °?   Defense and Veterans Brain Injury Center: dvbic.dcoe.mil °? Department of Veterans Affairs Military and Veterans Crisis Line: 1-800-273-8255 °Summary °· A Traumatic brain injury (TBI) is an injury to the brain that can interfere with vision, memory, concentration, speech, balance, sense of touch, and sleep. TBI can also cause chronic symptoms like headache or dizziness. °· Traumatic brain injuries affect different people in different ways. It is important to understand that recovery takes time and looks different for each person. °· You can support your loved one with a TBI by assisting with daily tasks, setting reminders, encouraging rest, and being patient. °· It is important to take care of yourself as a caregiver. Take time to exercise, manage stress, spend time with supportive people, and rest. Ask for help, find a support group, and meet with a mental health counselor, if needed. °This information is not intended to replace advice given to you by your  health care provider. Make sure you discuss any questions you have with your health care provider. °Document Released: 05/15/2016 Document Revised: 05/15/2016 Document Reviewed: 05/15/2016 °Elsevier Interactive Patient Education © 2018 Elsevier Inc. ° °
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
## 2018-03-19 ENCOUNTER — Inpatient Hospital Stay (HOSPITAL_COMMUNITY): Payer: BLUE CROSS/BLUE SHIELD | Admitting: Physical Therapy

## 2018-03-19 ENCOUNTER — Inpatient Hospital Stay (HOSPITAL_COMMUNITY): Payer: BLUE CROSS/BLUE SHIELD

## 2018-03-19 ENCOUNTER — Inpatient Hospital Stay (HOSPITAL_COMMUNITY): Payer: BLUE CROSS/BLUE SHIELD | Admitting: Speech Pathology

## 2018-03-19 DIAGNOSIS — G44309 Post-traumatic headache, unspecified, not intractable: Secondary | ICD-10-CM

## 2018-03-19 DIAGNOSIS — S2232XA Fracture of one rib, left side, initial encounter for closed fracture: Secondary | ICD-10-CM

## 2018-03-19 DIAGNOSIS — I609 Nontraumatic subarachnoid hemorrhage, unspecified: Secondary | ICD-10-CM

## 2018-03-19 DIAGNOSIS — S42192D Fracture of other part of scapula, left shoulder, subsequent encounter for fracture with routine healing: Secondary | ICD-10-CM

## 2018-03-19 DIAGNOSIS — G44319 Acute post-traumatic headache, not intractable: Secondary | ICD-10-CM

## 2018-03-19 DIAGNOSIS — S42102A Fracture of unspecified part of scapula, left shoulder, initial encounter for closed fracture: Secondary | ICD-10-CM
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
## 2018-03-19 NOTE — Progress Notes (Signed)
Pt has verbalized when to press call button, knows where call button is located.  Has slept most his day after therapy. Ordered telesitter for HS however they will have to see if available at this time. Will continue to monitor.
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
## 2018-03-20 ENCOUNTER — Inpatient Hospital Stay (HOSPITAL_COMMUNITY): Payer: BLUE CROSS/BLUE SHIELD | Admitting: Physical Therapy

## 2018-03-20 DIAGNOSIS — F1020 Alcohol dependence, uncomplicated: Secondary | ICD-10-CM

## 2018-03-20 NOTE — Progress Notes (Signed)
Patient expressed understanding of his surroundings, able to call for help appropriately, enclosure bed unzipped. We continue to monitor.

## 2018-03-20 NOTE — Progress Notes (Signed)
Physical Therapy Session Note  Patient Details  Name: Bradley Cummings MRN: [id number] Date of Birth: [birth year]  Today's Date: 03/20/2018 PT Individual Time: 0915-0942 PT Individual Time Calculation (min): 27 min   Short Term Goals: Week 1:  PT Short Term Goal 1 (Week 1): =LTGs due to ELOS  Skilled Therapeutic Interventions/Progress Updates:    session focused on hip, ankle and core stability for balance and postural control.  Pt performs transfers and gait without AD with CGA. Pt with noted increased lumbar lordosis and delayed balance reactions.  Quadruped alt UE, alt LE and then alt UE/LE lifts with pt requiring tactile cues for core activation.  Alternating wide and narrow BOS squats 3 x 10 with pt stating 'I can feel that'.  Pt performs squats with 6# medicine ball 2 x 15.  Standing on balance board in parallel bars for A/P and lateral wt shifts with min A, pt able to perform lateral wt shifts without UE support but requires UE support for A/P wt shifts.  Pt left in recliner with alarm set, needs at hand.  Therapy Documentation Precautions:  Precautions Precautions: Fall Precaution Comments: impulsive, unsteady Restrictions Weight Bearing Restrictions: No Pain: No c/o pain during session   Therapy/Group: Individual Therapy  Shayle Donahoo 03/20/2018, 9:42 AM

## 2018-03-20 NOTE — Progress Notes (Signed)
Pt able to call appropriately, no behaviors during day or last night.  Request that enclosure bed be d/c. Telemonitor  In room, chair belt on with alarm on and functional.  Call bell within reach. Pt sitting in recliner eating breakfast. Will continue to monitor.
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
## 2018-03-21 ENCOUNTER — Inpatient Hospital Stay (HOSPITAL_COMMUNITY): Payer: BLUE CROSS/BLUE SHIELD

## 2018-03-21 ENCOUNTER — Inpatient Hospital Stay (HOSPITAL_COMMUNITY): Payer: BLUE CROSS/BLUE SHIELD | Admitting: Occupational Therapy

## 2018-03-21 DIAGNOSIS — S06303S Unspecified focal traumatic brain injury with loss of consciousness of 1 hour to 5 hours 59 minutes, sequela: Secondary | ICD-10-CM

## 2018-03-21 DIAGNOSIS — S42102S Fracture of unspecified part of scapula, left shoulder, sequela: Secondary | ICD-10-CM

## 2018-03-21 LAB — CBC WITH DIFFERENTIAL/PLATELET
Abs Immature Granulocytes: 0.04 10*3/uL (ref 0.00–0.07)
BASOS ABS: 0 10*3/uL (ref 0.0–0.1)
Basophils Relative: 1 %
EOS ABS: 0.5 10*3/uL (ref 0.0–0.5)
EOS PCT: 9 %
HEMATOCRIT: 37.2 % — AB (ref 39.0–52.0)
Hemoglobin: 11.6 g/dL — ABNORMAL LOW (ref 13.0–17.0)
Immature Granulocytes: 1 %
LYMPHS ABS: 1.9 10*3/uL (ref 0.7–4.0)
Lymphocytes Relative: 34 %
MCH: 28.7 pg (ref 26.0–34.0)
MCHC: 31.2 g/dL (ref 30.0–36.0)
MCV: 92.1 fL (ref 80.0–100.0)
MONO ABS: 1.2 10*3/uL — AB (ref 0.1–1.0)
Monocytes Relative: 22 %
NRBC: 0 % (ref 0.0–0.2)
Neutro Abs: 1.9 10*3/uL (ref 1.7–7.7)
Neutrophils Relative %: 33 %
Platelets: 308 10*3/uL (ref 150–400)
RBC: 4.04 MIL/uL — ABNORMAL LOW (ref 4.22–5.81)
RDW: 12.1 % (ref 11.5–15.5)
WBC: 5.5 10*3/uL (ref 4.0–10.5)

## 2018-03-21 LAB — COMPREHENSIVE METABOLIC PANEL
ALT: 18 U/L (ref 0–44)
AST: 24 U/L (ref 15–41)
Albumin: 3.4 g/dL — ABNORMAL LOW (ref 3.5–5.0)
Alkaline Phosphatase: 50 U/L (ref 38–126)
Anion gap: 8 (ref 5–15)
BILIRUBIN TOTAL: 0.6 mg/dL (ref 0.3–1.2)
BUN: 7 mg/dL (ref 6–20)
CALCIUM: 9.2 mg/dL (ref 8.9–10.3)
CO2: 27 mmol/L (ref 22–32)
Chloride: 99 mmol/L (ref 98–111)
Creatinine, Ser: 0.82 mg/dL (ref 0.61–1.24)
Glucose, Bld: 97 mg/dL (ref 70–99)
POTASSIUM: 4.3 mmol/L (ref 3.5–5.1)
Sodium: 134 mmol/L — ABNORMAL LOW (ref 135–145)
TOTAL PROTEIN: 6.7 g/dL (ref 6.5–8.1)

## 2018-03-21 MED ORDER — QUETIAPINE FUMARATE 50 MG PO TABS
150.0000 mg | ORAL_TABLET | Freq: Two times a day (BID) | ORAL | Status: DC
  Administered 2018-03-21 – 2018-03-22 (×2): 150 mg via ORAL
  Filled 2018-03-21 (×2): qty 1

## 2018-03-21 NOTE — Progress Notes (Signed)
Physical Therapy Session Note  Patient Details  Name: Bradley Cummings MRN: [id number] Date of Birth: November 23, 1990  Today's Date: 03/21/2018 PT Individual Time: 1510-1610 PT Individual Time Calculation (min): 60 min   Short Term Goals: Week 1:  PT Short Term Goal 1 (Week 1): =LTGs due to ELOS  Skilled Therapeutic Interventions/Progress Updates:   Pt resting in bed, but motivated to start tx.  Gait throughout unit without AD, supervision.  neuromuscular re-education via forced use for marching at 30 cm/sec  in sitting x 25 cycles and standing , with bil UE support, fading to 0UE support (hands on head) to activate trunk righting.  Pt with increased difficulty shifting to L.    In standing,  given external perturbations, pt demonstrated bil ankle and R stepping strategy; absent bil hip strategy. With practice, pt improved with bil hip strategy.   Balance System for wt shifting L><R and A><P, without use of UEs, scoring 75% for lateral wt shifting, and 60% for A-P wt shifting.  Pt inmproved with practice, but found it exhausted.  Discussed falls recovery and use of 911.  Pt performed floor transfer with superivisohn, safely.  Coordination/balance activity, jumping with bil feet in/out of hoola hoop on floor,  forward/backard x 3, with improving accuracy, not touching Hoola hoop by 3rd trial.   Advanced gait to return to room, kicking Yoga block with L/R foot, without LOB.  Pt exhausted.  Returned to bed and left resting with alarm on and needs at hand.  PT heated up hamberger that his father had brought in for him, at pt's request.  Pt had no problems chewing burger.  PT consulted with Crystal, RN about pt being on soft food diet; she will follow up.      Therapy Documentation Precautions:  Precautions Precautions: Fall Precaution Comments: impulsive, unsteady Restrictions Weight Bearing Restrictions: No  Pain: Pain Assessment Pain Score: 0-No pain    Therapy/Group: Individual  Therapy  Bellah Alia 03/21/2018, 4:37 PM
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
## 2018-03-21 NOTE — Progress Notes (Signed)
Occupational Therapy Session Note  Patient Details  Name: Bradley Cummings MRN: [id number] Date of Birth: 12-Jun-1990  Today's Date: 03/21/2018 OT Individual Time: 1030-1100 OT Individual Time Calculation (min): 30 min    Short Term Goals: Week 1:  OT Short Term Goal 1 (Week 1): STG=LTG d/t ELOS  Skilled Therapeutic Interventions/Progress Updates:    Pt seen for OT ADL bathing/dressing session. Pt asleep in supine upon arrival, easily awoken and agreeable to tx session, denying pain. He ambulated throughout session at distant supervision-mod I level. Several LOB episodes, however, able to self-correct or use wall to prevent total LOB. He bathed seated on shower chair with assist for set-up only. He dressed from standing position, able to balance on one foot to thread pants, able to self correct any LOB episodes. Grooming tasks completed standing at sink mod I. In ADL apartment, completed simulated tub/shower transfer stepping over tub wall with supervision, no use of external aids for balance.  Completed dual task in therapy gym, ambulating while bouncing tennis ball and required to name [employer] teams with each bounce, completed with supervision and min cuing to recall [employer] teams after pt able to name ~10 teams independently. Pt left seated EOM at end of session with hand off to next OT.   Therapy Documentation Precautions:  Precautions Precautions: Fall Precaution Comments: impulsive, unsteady Restrictions Weight Bearing Restrictions: No   Therapy/Group: Individual Therapy  Taurean Ju L 03/21/2018, 10:45 AM
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
## 2018-03-21 NOTE — Progress Notes (Signed)
Speech Language Pathology Daily Session Note  Patient Details  Name: Claus Silvestro MRN: [id number] Date of Birth: 01/07/1991  Today's Date: 03/21/2018 SLP Individual Time: 1345-1445 SLP Individual Time Calculation (min): 60 min  Short Term Goals: Week 1: SLP Short Term Goal 1 (Week 1): STG=LTG due to ELOS   Skilled Therapeutic Interventions:Skilled ST services focused on cognitive skills. SLP facilitated semi-complex problem solving skills in functional task, pt required supervision A verbal cues in money management and daily math problems task and Mod I for personal medication and scheduling tasks. Pt did not require written aid to assist in working memory and recalled today's events/schedule Mod I. OT expressed concern of high level attention, pt was unable to alternate attention during ball throwing and naming task. SLP facilitated naming, pt was able to name 13 words starting with "F" within a minute and demonstrated no difficulty in word finding during conversation. SLP will address alternating attention in fictional task in future sessions . Pt was left in room with call bell within reach and bed alarm set. Recommend to continue skilled ST services.      Pain Pain Assessment Pain Score: 0-No pain  Therapy/Group: Individual Therapy  Semiah Konczal  [facility] 03/21/2018, 2:52 PM

## 2018-03-21 NOTE — Plan of Care (Signed)
  Problem: Consults Goal: RH BRAIN INJURY PATIENT EDUCATION Description Description: See Patient Education module for eduction specifics Outcome: Progressing   Problem: RH SKIN INTEGRITY Goal: RH STG SKIN FREE OF INFECTION/BREAKDOWN Description Skin to remain free from breakdown with supervision assistance.  Outcome: Progressing   Problem: RH SAFETY Goal: RH STG ADHERE TO SAFETY PRECAUTIONS W/ASSISTANCE/DEVICE Description STG Adhere to Safety Precautions With supervision Assistance.  Outcome: Progressing   Problem: RH PAIN MANAGEMENT Goal: RH STG PAIN MANAGED AT OR BELOW PT'S PAIN GOAL Description <3 on a 1-10 pain scale  Outcome: Progressing   Problem: RH KNOWLEDGE DEFICIT BRAIN INJURY Goal: RH STG INCREASE KNOWLEDGE OF SELF CARE AFTER BRAIN INJURY Description Patient and family will demonstrate knowledge of how to care for self after discharge including, medication management, follow-up with out patient therapies or home health, and MD with min assist from rehab staff.  Outcome: Progressing

## 2018-03-21 NOTE — IPOC Note (Signed)
Overall Plan of Care [facility]) Patient Details Name: Sirius Woodford MRN: [id number] DOB: [birth year]  Admitting Diagnosis: SAH (subarachnoid hemorrhage) [facility])  Hospital Problems: Principal Problem:   SAH (subarachnoid hemorrhage) (HCC) Active Problems:   Alcohol use disorder, moderate, dependence (HCC)   Closed left scapular fracture   Left rib fracture   Headache due to trauma     Functional Problem List: Nursing Medication Management, Safety, Pain, Perception, Edema  PT Balance, Endurance, Motor, Safety  OT Balance, Endurance, Perception, Vision, Cognition, Radiation protection practitioner, Motor, Behavior  SLP Cognition  TR         Basic ADL's: OT Bathing, Dressing, Toileting     Advanced  ADL's: OT       Transfers: PT Bed Mobility, Bed to Chair, Car, [employer], [employer] fast streamer, Tub/Shower     Locomotion: PT Stairs, Ambulation     Additional Impairments: OT None  SLP Social Cognition   Memory, Problem Solving, Awareness  TR      Anticipated Outcomes Item Anticipated Outcome  Self Feeding No goals set  Swallowing      Basic self-care  mod I  Toileting  mod I   Bathroom Transfers mod I  Bowel/Bladder  Independent   Transfers  supervision  Locomotion  supervision ambulatory with LRAD  Communication     Cognition  mod I   Pain  <3 on a 1-10 pain scale  Safety/Judgment  Supervision and calling for assistance   Therapy Plan: PT Intensity: Minimum of 1-2 x/day ,45 to 90 minutes PT Frequency: 5 out of 7 days PT Duration Estimated Length of Stay: 5-7 days OT Intensity: Minimum of 1-2 x/day, 45 to 90 minutes OT Frequency: 5 out of 7 days OT Duration/Estimated Length of Stay: 5-7 days SLP Intensity: Minumum of 1-2 x/day, 30 to 90 minutes SLP Frequency: 3 to 5 out of 7 days SLP Duration/Estimated Length of Stay: 5-7 days     Team Interventions: Nursing Interventions Patient/Family Education, Discharge Planning, Pain Management, Psychosocial Support, Medication  Management  PT interventions Ambulation/gait training, Stair training, Visual/perceptual remediation/compensation, Warden/ranger, DME/adaptive equipment instruction, Patient/family education, Therapeutic Activities, Cognitive remediation/compensation, Psychosocial support, Therapeutic Exercise, Functional mobility training, UE/LE Strength taining/ROM, Discharge planning, Neuromuscular re-education, UE/LE Coordination activities  OT Interventions Balance/vestibular training, Community reintegration, Disease mangement/prevention, Functional electrical stimulation, Patient/family education, Self Care/advanced ADL retraining, UE/LE Coordination activities, Psychosocial support, Skin care/wound managment, Therapeutic Activities, UE/LE Strength taining/ROM, Visual/perceptual remediation/compensation, Functional mobility training, Pain management, DME/adaptive equipment instruction, Discharge planning, Cognitive remediation/compensation, Therapeutic Exercise  SLP Interventions Cognitive remediation/compensation, Environmental controls, Internal/external aids, Patient/family education, Functional tasks  TR Interventions    SW/CM Interventions Discharge Planning, Psychosocial Support, Patient/Family Education   Barriers to Discharge MD  Medical stability  Nursing Decreased caregiver support, Home environment access/layout, Lack of/limited family support, Behavior    PT Other (comments) new cognitive deficits  OT      SLP      SW       Team Discharge Planning: Destination: PT-Home ,OT- Home , SLP-Home Projected Follow-up: PT-Home health PT, 24 hour supervision/assistance, OT-  24 hour supervision/assistance, SLP-Home Health SLP, Outpatient SLP, 24 hour supervision/assistance Projected Equipment Needs: PT-None recommended by PT, OT- None recommended by OT, SLP-None recommended by SLP Equipment Details: PT- , OT-  Patient/family involved in discharge planning: PT- Patient unable/family or  caregiver not available,  OT-Patient, Family member/caregiver, SLP-Patient  MD ELOS: 5-7 days Medical Rehab Prognosis:  Excellent Assessment: The patient has been admitted for CIR therapies with the diagnosis  of TBI/polytrauma. The team will be addressing functional mobility, strength, stamina, balance, safety, adaptive techniques and equipment, self-care, bowel and bladder mgt, patient and caregiver education, NMR, cognitive-behavioral mgt, pain mgt, . Goals have been set at mod I for self-care and Mod I to supervision with mobility and cognition.    Ranelle Oyster, MD, FAAPMR      See Team Conference Notes for weekly updates to the plan of care

## 2018-03-21 NOTE — Progress Notes (Signed)
Physical Therapy Session Note  Patient Details  Name: Bradley Cummings MRN: [id number] Date of Birth: January 29, 1991  Today's Date: 03/21/2018 PT Individual Time: 0915-0956 PT Individual Time Calculation (min): 41 min   Short Term Goals: Week 1:  PT Short Term Goal 1 (Week 1): =LTGs due to ELOS  Skilled Therapeutic Interventions/Progress Updates:    Pt supine in bed upon PT arrival, agreeable to therapy tx and denies pain. Pt transferred to sitting EOB with supervision and ambulated to the gym with CGA, no AD x 150 ft. Pt worked on dynamic standing balance this session to participate in functional gait assessment as detailed below, scored 27/30 and discussed results with pt. Pt worked on community distance ambulation without AD navigating busy hallways and walking on unlevel surfaces, ambulation from gym>atrium>outside>north tower>back to unit, 1000 ft and ascending 12 steps with single handrail, supervision-CGA. Pt worked on dynamic standing balance without AD while standing on airex tossing ball against rebounder, side stepping within agility ladder, forwards/backwards stepping within agility ladder. Pt ambulated back to room, ambulated into bathroom and performed toileting tasks without assist. Pt washed hands and transferred to bed, left supine with needs in reach.   Therapy Documentation Precautions:  Precautions Precautions: Fall Precaution Comments: impulsive, unsteady Restrictions Weight Bearing Restrictions: No   Balance Standardized Balance Assessment Standardized Balance Assessment: Functional Gait Assessment Functional Gait  Assessment Gait Level Surface: Walks 20 ft in less than 5.5 sec, no assistive devices, good speed, no evidence for imbalance, normal gait pattern, deviates no more than 6 in outside of the 12 in walkway width. Change in Gait Speed: Able to smoothly change walking speed without loss of balance or gait deviation. Deviate no more than 6 in outside of the 12 in walkway  width. Gait with Horizontal Head Turns: Performs head turns smoothly with slight change in gait velocity (eg, minor disruption to smooth gait path), deviates 6-10 in outside 12 in walkway width, or uses an assistive device. Gait with Vertical Head Turns: Performs head turns with no change in gait. Deviates no more than 6 in outside 12 in walkway width. Gait and Pivot Turn: Pivot turns safely within 3 sec and stops quickly with no loss of balance. Step Over Obstacle: Is able to step over 2 stacked shoe boxes taped together (9 in total height) without changing gait speed. No evidence of imbalance. Gait with Narrow Base of Support: Is able to ambulate for 10 steps heel to toe with no staggering. Gait with Eyes Closed: Walks 20 ft, uses assistive device, slower speed, mild gait deviations, deviates 6-10 in outside 12 in walkway width. Ambulates 20 ft in less than 9 sec but greater than 7 sec. Ambulating Backwards: Walks 20 ft, no assistive devices, good speed, no evidence for imbalance, normal gait Steps: Alternating feet, must use rail. Total Score: 27    Therapy/Group: Individual Therapy  Cresenciano Genre, PT, DPT 03/21/2018, 7:59 AM

## 2018-03-22 ENCOUNTER — Inpatient Hospital Stay (HOSPITAL_COMMUNITY): Payer: Self-pay | Admitting: Physical Therapy

## 2018-03-22 ENCOUNTER — Inpatient Hospital Stay (HOSPITAL_COMMUNITY): Payer: Self-pay | Admitting: Speech Pathology

## 2018-03-22 ENCOUNTER — Inpatient Hospital Stay (HOSPITAL_COMMUNITY): Payer: Self-pay | Admitting: Occupational Therapy

## 2018-03-22 ENCOUNTER — Inpatient Hospital Stay (HOSPITAL_COMMUNITY): Payer: Self-pay

## 2018-03-22 MED ORDER — CLONAZEPAM 0.5 MG PO TABS: 0.2500 mg | ORAL_TABLET | Freq: Three times a day (TID) | ORAL | Status: DC

## 2018-03-22 MED ORDER — QUETIAPINE FUMARATE 100 MG PO TABS
100.0000 mg | ORAL_TABLET | Freq: Two times a day (BID) | ORAL | Status: DC
  Administered 2018-03-22 – 2018-03-23 (×2): 100 mg via ORAL
  Filled 2018-03-22 (×2): qty 1

## 2018-03-22 MED ORDER — SORBITOL 70 % SOLN
30.0000 mL | Freq: Once | Status: AC
  Administered 2018-03-22: 30 mL via ORAL
  Filled 2018-03-22: qty 30

## 2018-03-22 MED ORDER — CLONAZEPAM 0.25 MG PO TBDP
0.2500 mg | ORAL_TABLET | Freq: Three times a day (TID) | ORAL | Status: DC
  Administered 2018-03-22 – 2018-03-23 (×3): 0.25 mg via ORAL
  Filled 2018-03-22 (×3): qty 1

## 2018-03-22 NOTE — Patient Care Conference (Signed)
Inpatient RehabilitationTeam Conference and Plan of Care Update Date: 03/23/2018   Time: 2:50 PM    Patient Name: Bradley Cummings      Medical Record Number: [id number]  Date of Birth: 08-22-1990 Sex: Male         Room/Bed: 4W05C/4W05C-01 Payor Info: Payor: BLUE CROSS BLUE SHIELD / Plan: BCBS OTHER / Product Type: *No Product type* /    Admitting Diagnosis: Traumatic SAH  Admit Date/Time:  03/18/2018  6:20 PM Admission Comments: No comment available   Primary Diagnosis:  SAH (subarachnoid hemorrhage) (HCC) Principal Problem: SAH (subarachnoid hemorrhage) (HCC)  Patient Active Problem List   Diagnosis Date Noted  . Closed left scapular fracture 03/19/2018  . Left rib fracture 03/19/2018  . Headache due to trauma 03/19/2018  . Traumatic subdural hematoma (HCC) 03/18/2018  . SAH (subarachnoid hemorrhage) (HCC) 03/13/2018  . MDD (major depressive disorder), single episode, severe with psychosis (HCC) 04/03/2016  . Alcohol use disorder, moderate, dependence (HCC) 04/03/2016  . Cannabis use disorder, mild, abuse 04/03/2016    Expected Discharge Date: Expected Discharge Date: 03/23/18  Team Members Present: Physician leading conference: Dr. Faith Rogue Social Worker Present: Amada Jupiter, LCSW Nurse Present: Keturah Barre, RN PT Present: Judieth Keens, PT OT Present: Callie Fielding, OT SLP Present: Jackalyn Lombard, SLP PPS Coordinator present : Tora Duck, RN, CRRN     Current Status/Progress Goal Weekly Team Focus  Medical   tbi with polytrauma. improved cognition and behavior. hx of etoh abuse  improve cognitive-behavioral status  pain, mood modulation   Bowel/Bladder   Continent of bowel/bladder; LBM 03/19/18  remain continent of bowel/bladder with supervision  Assess bowel/bladder q shift and as needed   Swallow/Nutrition/ Hydration             ADL's   Supervision-occasional mod I. Occasional LOB which pt is able to self-correct  Mod I overall  ADL/IADL re-training, high level  balance, d/c planning   Mobility   supervision/mod I  supervision  d/c planning   Communication             Safety/Cognition/ Behavioral Observations  supervision-mod I   mod I   pt is ready to d/c tomorrow   Pain   No complain of pain  <2  Assess and treat pain q shift and as needed   Skin   Wound to the back if his head OTA, generalized bruises  Maintain skin integrity  Assess skin q shift and as needed    Rehab Goals Patient on target to meet rehab goals: Yes *See Care Plan and progress notes for long and short-term goals.     Barriers to Discharge  Current Status/Progress Possible Resolutions Date Resolved   Physician    Medical stability               Nursing                  PT  Other (comments)  new cognitive deficits              OT                  SLP                SW                Discharge Planning/Teaching Needs:  Plan to d/c home with family able to provide 24/7 supervision.  NA - mod ind goals.   Team Discussion:  Improving quickly  with medical issues.  Team reports very flat, disengaged affect which MD feels could be due to heavy meds that are being weaned.  Making excellent progress and ready for d/c tomorrow.  Made mod ind in the room.  D/c'd telesitter.  Recommend OPPT and ST.  MD would like resources provided on ETOH recovery resources.  Revisions to Treatment Plan:  NA    Continued Need for Acute Rehabilitation Level of Care: The patient requires daily medical management by a physician with specialized training in physical medicine and rehabilitation for the following conditions: Daily direction of a multidisciplinary physical rehabilitation program to ensure safe treatment while eliciting the highest outcome that is of practical value to the patient.: Yes Daily medical management of patient stability for increased activity during participation in an intensive rehabilitation regime.: Yes Daily analysis of laboratory values and/or radiology  reports with any subsequent need for medication adjustment of medical intervention for : Mood/behavior problems   I attest that I was present, lead the team conference, and concur with the assessment and plan of the team.   Elion Hocker 03/23/2018, 8:46 AM
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
## 2018-03-22 NOTE — Progress Notes (Signed)
Speech Language Pathology Discharge Summary  Patient Details  Name: Bradley Cummings MRN: [id number] Date of Birth: 1990-11-06  Today's Date: 03/22/2018 SLP Individual Time: 4758-3074 SLP Individual Time Calculation (min): 32 min   Skilled Therapeutic Interventions:  Pt was seen for skilled ST targeting cognitive goals.  SLP facilitated the session with a scavenger hunt task in the hospital gift shop.  Pt was able to ambulate around the unit and to the atrium with mod I for way finding.  Pt located 6 targeted items from the gift shop and their corresponding prices with mod I and was also able to complete mental math calculations for 100% accuracy in a moderately distracting environment with mod I.  Pt found his way back to the unit from gift shop without cues and signed himself back into the unit.  Discussed taking his time with tasks at home and having family double check behind him with high risk for error activities (I.e. Medications).  Pt expressed a desire to return to college in the spring to study international business which could be a focus of future ST treatment sessions.  All questions were answered to pt's satisfaction at this time.  Pt was left sitting at edge of bed.  Pt is now mod I within the room and is ready for discharge tomorrow.      Patient has met 3 of 3 long term goals.  Patient to discharge at overall Modified Independent level.  Reasons goals not met:     Clinical Impression/Discharge Summary:   Pt has made functional gains while inpatient and is discharging having met 3 out of 3 short term goals.  Pt is currently mod I for tasks due to mild cognitive deficits post TBI.  Pt is currently presenting with behaviors consistent with a RL VIII.    Pt education is complete at this time but no family has been present for education or to verify available level of support post d/c.  Pt has demonstrated improved recall of daily information and problem solving.  Pt is discharging home with  recommendations for ST follow up at next level of care.    Care Partner:  Caregiver Able to Provide Assistance: Other (comment)(family not present to verify )  Type of Caregiver Assistance: (family not present to verify)  Recommendation:  Outpatient SLP;24 hour supervision/assistance  Rationale for SLP Follow Up: Maximize cognitive function and independence   Equipment: none recommended by SLP    Reasons for discharge: Discharged from hospital   Patient/Family Agrees with Progress Made and Goals Achieved: Yes    Bradley Cummings, Selinda Orion 03/22/2018, 1:58 PM

## 2018-03-22 NOTE — Progress Notes (Signed)
Physical Therapy Discharge Summary  Patient Details  Name: Bradley Cummings MRN: [id number] Date of Birth: 10-Nov-1990  Today's Date: 03/22/2018 PT Individual Time: 0925-1012 PT Individual Time Calculation (min): 47 min   Pt performs gait throughout unit with mod I, pt able to correct LOB.  Stair negotiation x 2 flights with 1 handrail with supervision.  Dynamic gait with head turns and divided attention with mod I.  Gait with ball toss with cognitive task naming [employer] teams and animals with letters of the alphabet. Pt min/mod cuing for cognitive tasks, supervision for balance.  Gait with ball toss and ball bounce fwd, backward and sideways all with supervision.  Standing on foam with tapping all directions and squats all with supervision for LE strengthening.  Half kneeling with trunk PNFs with 6# wt with min A for balance.  Pt left in room with needs at hand.  Patient has met 7 of 7 long term goals due to improved activity tolerance, improved balance, improved postural control, ability to compensate for deficits, improved awareness and improved coordination.  Patient to discharge at an ambulatory level Modified Independent.    Reasons goals not met: n/a  Recommendation:  Patient will benefit from ongoing skilled PT services in outpatient setting to continue to advance safe functional mobility, address ongoing impairments in balance, core strength, and minimize fall risk.  Equipment: No equipment provided  Reasons for discharge: treatment goals met and discharge from hospital  Patient/family agrees with progress made and goals achieved: Yes  PT Discharge Precautions/Restrictions Precautions Precautions: Fall Restrictions Weight Bearing Restrictions: No Pain Pain Assessment Pain Score: 0-No pain Vision/Perception  Perception Perception: Within Functional Limits Praxis Praxis: Intact  Cognition Overall Cognitive Status: Impaired/Different from baseline Arousal/Alertness:  Awake/alert Sustained Attention: Appears intact Selective Attention: Appears intact Memory: Impaired Memory Impairment: Retrieval deficit;Decreased recall of new information Awareness Impairment: Anticipatory impairment Problem Solving: Impaired Problem Solving Impairment: Functional complex Behaviors: Impulsive Rancho Los Amigos Scales of Cognitive Functioning: Purposeful/appropriate Sensation Sensation Light Touch: Appears Intact Proprioception: Appears Intact Coordination Coordination and Movement Description: deconditioning Motor  Motor Motor - Discharge Observations: decreased postural control  Mobility Bed Mobility Supine to Sit: Independent Sit to Supine: Independent Transfers Sit to Stand: Independent Stand to Sit: Independent Stand Pivot Transfers: Independent Transfer (Assistive device): None Locomotion  Gait Gait Assistance: Independent Gait Distance (Feet): 400 Feet Assistive device: None Stairs / Additional Locomotion Stairs: Yes Stairs Assistance: Supervision/Verbal cueing Stair Management Technique: Two rails Number of Stairs: 24 Ramp: Supervision/Verbal cueing Curb: Supervision/Verbal cueing Wheelchair Mobility Wheelchair Mobility: No  Trunk/Postural Assessment  Cervical Assessment Cervical Assessment: Within Functional Limits Thoracic Assessment Thoracic Assessment: Within Functional Limits Lumbar Assessment Lumbar Assessment: Within Functional Limits Postural Control Righting Reactions: delayed  Balance Standardized Balance Assessment Standardized Balance Assessment: Functional Gait Assessment Dynamic Sitting Balance Dynamic Sitting - Level of Assistance: 7: Independent Functional Gait  Assessment Gait Level Surface: Walks 20 ft in less than 5.5 sec, no assistive devices, good speed, no evidence for imbalance, normal gait pattern, deviates no more than 6 in outside of the 12 in walkway width. Change in Gait Speed: Able to smoothly change  walking speed without loss of balance or gait deviation. Deviate no more than 6 in outside of the 12 in walkway width. Gait with Horizontal Head Turns: Performs head turns smoothly with slight change in gait velocity (eg, minor disruption to smooth gait path), deviates 6-10 in outside 12 in walkway width, or uses an assistive device. Gait with Vertical Head Turns: Performs head  turns with no change in gait. Deviates no more than 6 in outside 12 in walkway width. Gait and Pivot Turn: Pivot turns safely within 3 sec and stops quickly with no loss of balance. Step Over Obstacle: Is able to step over 2 stacked shoe boxes taped together (9 in total height) without changing gait speed. No evidence of imbalance. Gait with Narrow Base of Support: Is able to ambulate for 10 steps heel to toe with no staggering. Gait with Eyes Closed: Walks 20 ft, uses assistive device, slower speed, mild gait deviations, deviates 6-10 in outside 12 in walkway width. Ambulates 20 ft in less than 9 sec but greater than 7 sec. Ambulating Backwards: Walks 20 ft, no assistive devices, good speed, no evidence for imbalance, normal gait Steps: Alternating feet, must use rail. Total Score: 27 Extremity Assessment      RLE Assessment General Strength Comments: 5/5 proximal to distal LLE Assessment General Strength Comments: 5/5 proximal to distal    Ruhi Kopke 03/22/2018, 12:00 PM

## 2018-03-22 NOTE — Progress Notes (Signed)
Occupational Therapy Session Note  Patient Details  Name: Bradley Cummings MRN: [id number] Date of Birth: June 24, 1990  Today's Date: 03/22/2018 OT Individual Time: 1100-1155 OT Individual Time Calculation (min): 55 min    Short Term Goals: Week 1:  OT Short Term Goal 1 (Week 1): STG=LTG d/t ELOS  Skilled Therapeutic Interventions/Progress Updates:    Pt seen for OT ADL bathing/dressing session. Pt asleep in supine upon arrival, slightly increased time to arouse, however, once awoken eager to begin therapy session. Pt ambulated throughout session at distant supervision-mod I level, no AD. He gathered items for shower and bathed standing in shower mod I. Discussed with pt shower chair/BSC to use for seated rest breaks in shower at d/c, pt declined need and demonstrates good functional standing balance and endurance to be able to safely complete all bathing/dresssing tasks from standing position.  Pt required min questioning cues to locate therapy gym on unit.  In ADL apartment, completed simple meal prep task at stove level making scrambled eggs. He required min cuing for sequencing of activity though demonstrated good safety awareness and management of hot cook top, cooking times, etc. Pt tolerated ~30 minutes in standing before requiring seated rest break.  Pt returned to room at end of session, able to path find independently to room. He was left seated in chair at end of session, set-up with lunch tray and all needs in reach. Pt made mod I in room in prep for d/c home tomorrow at mod I level.  Therapy Documentation Precautions:  Precautions Precautions: Fall Precaution Comments: impulsive, unsteady Restrictions Weight Bearing Restrictions: No Pain:   No/denies pain   Therapy/Group: Individual Therapy  [name] L 03/22/2018, 7:04 AM

## 2018-03-22 NOTE — Progress Notes (Signed)
Occupational Therapy Discharge Summary  Patient Details  Name: Bradley Cummings MRN: [id number] Date of Birth: 27-Apr-1991   Patient has met 55 of 10 long term goals due to improved activity tolerance, improved balance, postural control, improved attention, improved awareness and improved coordination.  Patient to discharge at overall Modified Independent level.  Patient's care partner is independent to provide the necessary physical and cognitive assistance at discharge.  Pt to d/c home living with mother, father and brothers. Formal family education not completed, however, pt performing basic ADLs at mod I level.  Recommendation:  Patient with no further OT needs  Equipment: Shower chair  Reasons for discharge: treatment goals met and discharge from hospital  Patient/family agrees with progress made and goals achieved: Yes  OT Discharge Precautions/Restrictions  Precautions Precautions: Fall Restrictions Weight Bearing Restrictions: No ADL ADL Eating: Independent Where Assessed-Eating: Edge of bed Grooming: Independent Where Assessed-Grooming: Standing at sink Upper Body Bathing: Independent Where Assessed-Upper Body Bathing: Shower Lower Body Bathing: Independent Where Assessed-Lower Body Bathing: Shower Upper Body Dressing: Independent Where Assessed-Upper Body Dressing: Standing at sink Lower Body Dressing: Independent Where Assessed-Lower Body Dressing: Standing at sink Toileting: Minimal assistance Where Assessed-Toileting: Glass blower/designer: Programmer, applications Method: Counselling psychologist: Energy manager: Theatre stage manager Method: Optometrist: Grab bars Vision Baseline Vision/History: No visual deficits Patient Visual Report: No change from baseline Vision Assessment?: No apparent visual deficits Perception  Perception: Within Functional Limits Praxis Praxis:  Intact Cognition Overall Cognitive Status: Impaired/Different from baseline Arousal/Alertness: Awake/alert Sustained Attention: Appears intact Selective Attention: Appears intact Memory: Impaired Memory Impairment: Retrieval deficit;Decreased recall of new information Awareness Impairment: Anticipatory impairment Problem Solving: Impaired Problem Solving Impairment: Functional complex Behaviors: Impulsive Rancho Los Amigos Scales of Cognitive Functioning: Purposeful/appropriate Sensation Sensation Light Touch: Appears Intact Hot/Cold: Appears Intact Proprioception: Appears Intact Coordination Gross Motor Movements are Fluid and Coordinated: Yes Fine Motor Movements are Fluid and Coordinated: Yes Coordination and Movement Description: deconditioning Motor  Motor Motor: Within Functional Limits Motor - Discharge Observations: decreased postural control Trunk/Postural Assessment  Cervical Assessment Cervical Assessment: Within Functional Limits Thoracic Assessment Thoracic Assessment: Within Functional Limits Lumbar Assessment Lumbar Assessment: Within Functional Limits Postural Control Postural Control: Within Functional Limits Righting Reactions: delayed  Balance Balance Balance Assessed: Yes Standardized Balance Assessment Standardized Balance Assessment: Functional Gait Assessment Static Sitting Balance Static Sitting - Balance Support: Feet supported;No upper extremity supported Static Sitting - Level of Assistance: 7: Independent Dynamic Sitting Balance Dynamic Sitting - Balance Support: Feet supported;No upper extremity supported;During functional activity Dynamic Sitting - Level of Assistance: 7: Independent Static Standing Balance Static Standing - Balance Support: During functional activity;No upper extremity supported Static Standing - Level of Assistance: 7: Independent Dynamic Standing Balance Dynamic Standing - Balance Support: During functional  activity;No upper extremity supported Dynamic Standing - Level of Assistance: 7: Independent  Extremity/Trunk Assessment RUE Assessment RUE Assessment: Within Functional Limits LUE Assessment LUE Assessment: Exceptions to [facility] General Strength Comments: 4-/5   [name] L 03/22/2018, 9:33 AM

## 2018-03-22 NOTE — Progress Notes (Signed)
Physical Therapy Session Note  Patient Details  Name: Bradley Cummings MRN: [id number] Date of Birth: 27-Jan-1991  Today's Date: 03/22/2018 PT Individual Time: 7829-5621 PT Individual Time Calculation (min): 44 min   Short Term Goals: Week 1:  PT Short Term Goal 1 (Week 1): =LTGs due to ELOS  Skilled Therapeutic Interventions/Progress Updates:    Pt supine bed upon PT arrival, agreeable to therapy tx and denies pain. Pt reports some stiffness in B LEs. Pt transferred to sitting EOB with supervision and ambulated to sink with supervision to brush teeth. Pt ambulated from room>gym x 150 ft with supervision. Pt worked on dynamic standing balance this session standing on bosu x 1 min and standing on bosu while performing ball toss against rebounder x 1 min. Pt performed hamstring stretches 2 x 30 sec bilaterally. Pt transferred to supine and performed B LE stretches- hamstrings and piriformis. Pt performed 2 x 10 bridges for LE strengthening. Pt performed core strengthening exercises including 2 x 10 alternating LE flexion/extension and x 10 oblique crunches. Pt worked on pathfinding and community distance ambulation in order to recall route from yesterday gym>atrium>north tower>back to gym with min verbal cues for use of signs and other pathfinding strategies. Pt ambulated back to room with supervision and left seated with needs in reach and bed alarm set.   Therapy Documentation Precautions:  Precautions Precautions: Fall Precaution Comments: impulsive, unsteady Restrictions Weight Bearing Restrictions: No    Therapy/Group: Individual Therapy  Cresenciano Genre, PT, DPT 03/22/2018, 7:48 AM

## 2018-03-22 NOTE — Care Management (Signed)
[facility] Individual Statement of Services  Patient Name:  Bradley Cummings  Date:  03/22/2018  Welcome to the [facility].  Our goal is to provide you with an individualized program based on your diagnosis and situation, designed to meet your specific needs.  With this comprehensive rehabilitation program, you will be expected to participate in at least 3 hours of rehabilitation therapies Monday-Friday, with modified therapy programming on the weekends.  Your rehabilitation program will include the following services:  Physical Therapy (PT), Occupational Therapy (OT), Speech Therapy (ST), 24 hour per day rehabilitation nursing, Therapeutic Recreaction (TR), Neuropsychology, Case Management (Social Worker), Rehabilitation Medicine, Nutrition Services and Pharmacy Services  Weekly team conferences will be held on Tuesdays to discuss your progress.  Your Social Worker will talk with you frequently to get your input and to update you on team discussions.  Team conferences with you and your family in attendance may also be held.  Expected length of stay: 5-7 days   Overall anticipated outcome: supervision  Depending on your progress and recovery, your program may change. Your Social Worker will coordinate services and will keep you informed of any changes. Your Social Worker's name and contact numbers are listed  below.  The following services may also be recommended but are not provided by the [facility]:   Driving Evaluations  Home Health Rehabiltiation Services  Outpatient Rehabilitation Services  Vocational Rehabilitation  Arrangements will be made to provide these services after discharge if needed.  Arrangements include referral to agencies that provide these services.  Your insurance has been verified to be:  [employer] Your primary doctor is:  None  Pertinent information will be shared with your doctor and your insurance  company.  Social Worker:  [place], [place] [phone] or ([phone]   Information discussed with and copy given to patient by: Amada Jupiter, 03/22/2018, 9:31 AM

## 2018-03-22 NOTE — Progress Notes (Signed)
Occupational Therapy Session Note  Patient Details  Name: Bradley Cummings MRN: [id number] Date of Birth: 08/23/1990  Today's Date: 03/22/2018 OT Individual Time: 1400-1500 OT Individual Time Calculation (min): 60 min    Short Term Goals: Week 1:  OT Short Term Goal 1 (Week 1): STG=LTG d/t ELOS  Skilled Therapeutic Interventions/Progress Updates:    Pt asleep in bed upon entry easy to awaken and agreeable to session- no reports of pain however reports of muscle soreness in arms and legs from previous sessions. Pt ambulated throughout session around rehab floor and throughout hospital with supervision-mod I with no AD. Pt participated in various activities challenging endurance/activity tolerance, attention to task, reaction time, and dynamic standing balance.   In therapy gym- standing on bosu ball pt participated in basketball activity challenging balance reaching outside BOS and endurance with pt stepping off bosu ball and retrieving ball each time with supervision for safety. No rest breaks needed.  Pt then ambulated to dynavision room and performed multiple trials in standing with increased challenges each time requiring increased levels of attention and displacement of balance while standing on bosu ball and holding 5# weight during dynavision tasks outside BOS. Pt also completed trial sitting on therapy ball while performing sit to stand initiation every 2nd light on dynavision with close supervision. Pt SOB following seated trial req rest break in chair.  Pt with reports of enjoying playing football. In hallway pt performed simulated football tasks including hiking ball and sprinting down hallway to retrieve ball thrown by therapist with no LOB noted in order to challenge balance and endurance.   Lastly, pt ambulated to main entrance of hospital with supervision and instructions to remember how to get back to inpatient rehab unit. From main entrance pt able to recall all directions/steps back  to room however at times pt questioning which direction to take- no VC's given by therapist with pt then able to recall with increased time. Pt left seated in room with all needs in reach however mod I level in room.     Therapy Documentation Precautions:  Precautions Precautions: Fall Precaution Comments: impulsive, unsteady Restrictions Weight Bearing Restrictions: No   Therapy/Group: Individual Therapy  Eyanna Mcgonagle 03/22/2018, 3:03 PM

## 2018-03-22 NOTE — Discharge Summary (Signed)
Discharge summary job [id number]

## 2018-03-22 NOTE — Progress Notes (Signed)
Occupational Therapy Session Note  Patient Details  Name: Bradley Cummings MRN: [id number] Date of Birth: [birth year]  Today's Date: 03/21/2018 OT Individual Time:  - 11:00-11:45  Late entry       Short Term Goals: Week 1:  OT Short Term Goal 1 (Week 1): STG=LTG d/t ELOS  Skilled Therapeutic Interventions/Progress Updates:    1:! Therapeutic activities with focus on cardiopulmonary endurance, dynamic standing balance, working memory, alternating attention, functional problem solving. Pt participated in map activity; reading a question and then walking across the room and then reading the map to report the answer.  While pt was studying the map pt stood on a dynamic surface (rocker board and Bocue. Pt able to complete task without cues but did require min A for balance. Pt able to performing standing on Bocue ball for 1 min with min A for balance- able to correct but with extra time and occasional min A. Pt able to jog ~400 feet x2 with close supervision; with a standing rest break (normal resting HR). Return to room, navigating on his own- ice applied to shoulder for comfort.   Therapy Documentation Precautions:  Precautions Precautions: Fall Precaution Comments: impulsive, unsteady Restrictions Weight Bearing Restrictions: No General:   Vital Signs: Therapy Vitals Temp: 98.3 F (36.8 C) Temp Source: Oral Pulse Rate: 63 Resp: 18 BP: 106/63 Patient Position (if appropriate): Lying Oxygen Therapy SpO2: 100 % O2 Device: Room Air Pain:  no reports of pain   Therapy/Group: Individual Therapy  Roney Mans [facility] 03/22/2018, 7:49 AM
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
## 2018-03-23 MED ORDER — QUETIAPINE FUMARATE 100 MG PO TABS: 100.0000 mg | ORAL_TABLET | Freq: Two times a day (BID) | ORAL | 0 refills | Status: DC

## 2018-03-23 MED ORDER — CLONAZEPAM 0.5 MG PO TABS: ORAL_TABLET | ORAL | 0 refills | Status: DC

## 2018-03-23 MED ORDER — FOLIC ACID 1 MG PO TABS: 1.0000 mg | ORAL_TABLET | Freq: Every day | ORAL | 0 refills | Status: DC

## 2018-03-23 MED ORDER — SUCRALFATE 1 G PO TABS: 1.0000 g | ORAL_TABLET | Freq: Three times a day (TID) | ORAL | 0 refills | Status: DC

## 2018-03-23 MED ORDER — CLONAZEPAM 0.25 MG PO TBDP: 0.2500 mg | ORAL_TABLET | Freq: Three times a day (TID) | ORAL | 0 refills | Status: DC

## 2018-03-23 MED ORDER — OMEPRAZOLE 20 MG PO CPDR: 20.0000 mg | DELAYED_RELEASE_CAPSULE | Freq: Every day | ORAL | 1 refills | Status: DC

## 2018-03-23 MED ORDER — QUETIAPINE FUMARATE 50 MG PO TABS: ORAL_TABLET | ORAL | 0 refills | Status: DC

## 2018-03-23 MED ORDER — NICOTINE 21 MG/24HR TD PT24: MEDICATED_PATCH | TRANSDERMAL | 0 refills | Status: DC

## 2018-03-23 MED ORDER — ACETAMINOPHEN 325 MG PO TABS: 650.0000 mg | ORAL_TABLET | Freq: Four times a day (QID) | ORAL | Status: DC | PRN

## 2018-03-23 MED ORDER — ADULT MULTIVITAMIN W/MINERALS CH: 1.0000 | ORAL_TABLET | Freq: Every day | ORAL | Status: DC

## 2018-03-23 NOTE — Progress Notes (Signed)
Social Work  Discharge Note  The overall goal for the admission was met for:   Discharge location: Yes - home with family who can provide 24/7 supervision  Length of Stay: Yes - 5 days  Discharge activity level: Yes - independent/ supervision  Home/community participation: Yes  Services provided included: MD, RD, PT, OT, SLP, RN, TR, Pharmacy and SW  Financial Services: Private Insurance: [employer]  Follow-up services arranged: Outpatient: PT, ST via [facility] and Patient/Family has no preference for HH/DME agencies  Comments (or additional information): provided pt with local counseling and SA resources and reviewed.  Patient/Family verbalized understanding of follow-up arrangements: Yes  Individual responsible for coordination of the follow-up plan: pt  Confirmed correct DME delivered: NA    Bradley Cummings

## 2018-03-23 NOTE — Progress Notes (Signed)
Patient discharged to home, accompanied by his father.
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
## 2018-03-23 NOTE — Discharge Instructions (Signed)
Inpatient Rehab Discharge Instructions  Bradley Cummings Discharge date and time: No discharge date for patient encounter.   Activities/Precautions/ Functional Status: Activity: activity as tolerated Diet: regular diet Wound Care: none needed Functional status:  ___ No restrictions     ___ Walk up steps independently ___ 24/7 supervision/assistance   ___ Walk up steps with assistance ___ Intermittent supervision/assistance  ___ Bathe/dress independently ___ Walk with walker     _x__ Bathe/dress with assistance ___ Walk Independently    ___ Shower independently ___ Walk with assistance    ___ Shower with assistance ___ No alcohol     ___ Return to work/school ________    COMMUNITY REFERRALS UPON DISCHARGE:    Outpatient: PT       ST                   Agency:  [facility]   Phone: [phone]                Appointment Date/Time:  03/29/18 arrive at 10:00 am for 10:15 am appointment with physical therapy and                                                                                                                     11:45 am appointment with speech therapy     GENERAL COMMUNITY RESOURCES FOR PATIENT/FAMILY:  Support Groups:  [place] [facility]    Mental Health:  See handouts on resources       Special Instructions: NO DRIVING SMOKING OR ALCOHOL   My questions have been answered and I understand these instructions. I will adhere to these goals and the provided educational materials after my discharge from the hospital.  Patient/Caregiver Signature _______________________________ Date __________  Clinician Signature _______________________________________ Date __________  Please bring this form and your medication list with you to all your follow-up doctor's appointments.

## 2018-03-24 ENCOUNTER — Telehealth: Payer: Self-pay | Admitting: Registered Nurse

## 2018-03-24 NOTE — Telephone Encounter (Signed)
Placed another call to Mr. Nodarse, no answer, voice mail full.

## 2018-03-24 NOTE — Telephone Encounter (Signed)
Placed a call to Bradley Cummings, no answer, unable to leave message, voice mail full.  Attempt 1, will try again.

## 2018-03-25 NOTE — Telephone Encounter (Signed)
Transitional Care call Transitional Care Questions answered by father Mr. Moshok Loya  Patient name: Bradley Cummings, Bradley Cummings  DOB: 1990-10-21 1. Are you/is patient experiencing any problems since coming home? No a. Are there any questions regarding any aspect of care? No 2. Are there any questions regarding medications administration/dosing? No a. Are meds being taken as prescribed? Yes b. "Patient should review meds with caller to confirm" Medication List Reviewed 3. Have there been any falls? No 4. Has Home Health been to the house and/or have they contacted you? He has a scheduled appointment with Neuro Rehabilitation. a. If not, have you tried to contact them? NA b. Can we help you contact them? NA 5. Are bowels and bladder emptying properly? Yes a. Are there any unexpected incontinence issues? No b. If applicable, is patient following bowel/bladder programs? NA 6. Any fevers, problems with breathing, unexpected pain? No 7. Are there any skin problems or new areas of breakdown? No 8. Has the patient/family member arranged specialty MD follow up (ie cardiology/neurology/renal/surgical/etc.)?  Mr. Fetters was given Dr. Franky Macho phone number and instructed to call for HFU appointment he verbalizes understanding.  a. Can we help arrange? NA 9. Does the patient need any other services or support that we can help arrange? No 10. Are caregivers following through as expected in assisting the patient? Yes 11. Has the patient quit smoking, drinking alcohol, or using drugs as recommended? Mr. Addo  12. ( father) reports his son Mr. Davone Shinault is still smoking he was encouraged smoking cessation, he verbalizes understanding. Also states he is not sure if his son is drinking alcohol or using illicit drugs.   Appointment date/time 04/05/2018  arrival time 10:40 for 11:00 appointment with Dr. Riley Kill . At [address] [facility] [address]

## 2018-03-29 ENCOUNTER — Ambulatory Visit: Payer: BLUE CROSS/BLUE SHIELD | Admitting: Speech Pathology

## 2018-03-29 ENCOUNTER — Ambulatory Visit: Payer: BLUE CROSS/BLUE SHIELD | Attending: Physical Therapy | Admitting: Physical Therapy

## 2018-04-05 ENCOUNTER — Encounter
Payer: BLUE CROSS/BLUE SHIELD | Attending: Physical Medicine & Rehabilitation | Admitting: Physical Medicine & Rehabilitation

## 2018-04-05 ENCOUNTER — Encounter: Payer: Self-pay | Admitting: Physical Medicine & Rehabilitation

## 2018-04-05 VITALS — BP 106/73 | HR 92 | Ht 70.5 in | Wt 155.2 lb

## 2018-04-05 DIAGNOSIS — F1010 Alcohol abuse, uncomplicated: Secondary | ICD-10-CM | POA: Insufficient documentation

## 2018-04-05 DIAGNOSIS — F17210 Nicotine dependence, cigarettes, uncomplicated: Secondary | ICD-10-CM | POA: Diagnosis not present

## 2018-04-05 DIAGNOSIS — S066X9D Traumatic subarachnoid hemorrhage with loss of consciousness of unspecified duration, subsequent encounter: Secondary | ICD-10-CM | POA: Diagnosis not present

## 2018-04-05 DIAGNOSIS — S065X3S Traumatic subdural hemorrhage with loss of consciousness of 1 hour to 5 hours 59 minutes, sequela: Secondary | ICD-10-CM

## 2018-04-05 DIAGNOSIS — S2239XD Fracture of one rib, unspecified side, subsequent encounter for fracture with routine healing: Secondary | ICD-10-CM | POA: Insufficient documentation

## 2018-04-05 DIAGNOSIS — F1910 Other psychoactive substance abuse, uncomplicated: Secondary | ICD-10-CM | POA: Insufficient documentation

## 2018-04-05 DIAGNOSIS — S42102D Fracture of unspecified part of scapula, left shoulder, subsequent encounter for fracture with routine healing: Secondary | ICD-10-CM | POA: Diagnosis not present

## 2018-04-05 DIAGNOSIS — M79672 Pain in left foot: Secondary | ICD-10-CM | POA: Insufficient documentation
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
## 2018-04-05 NOTE — Patient Instructions (Signed)
PLEASE FEEL FREE TO CALL OUR OFFICE WITH ANY PROBLEMS OR QUESTIONS ([phone])      

## 2018-06-07 ENCOUNTER — Encounter
Payer: BLUE CROSS/BLUE SHIELD | Attending: Physical Medicine & Rehabilitation | Admitting: Physical Medicine & Rehabilitation

## 2018-06-07 ENCOUNTER — Encounter: Payer: Self-pay | Admitting: Physical Medicine & Rehabilitation

## 2018-06-07 VITALS — BP 110/76 | HR 86 | Ht 73.0 in | Wt 155.0 lb

## 2018-06-07 DIAGNOSIS — S2239XD Fracture of one rib, unspecified side, subsequent encounter for fracture with routine healing: Secondary | ICD-10-CM | POA: Diagnosis not present

## 2018-06-07 DIAGNOSIS — S065X3S Traumatic subdural hemorrhage with loss of consciousness of 1 hour to 5 hours 59 minutes, sequela: Secondary | ICD-10-CM | POA: Diagnosis not present

## 2018-06-07 DIAGNOSIS — S42102D Fracture of unspecified part of scapula, left shoulder, subsequent encounter for fracture with routine healing: Secondary | ICD-10-CM | POA: Diagnosis not present

## 2018-06-07 DIAGNOSIS — F1910 Other psychoactive substance abuse, uncomplicated: Secondary | ICD-10-CM | POA: Diagnosis not present

## 2018-06-07 DIAGNOSIS — F1010 Alcohol abuse, uncomplicated: Secondary | ICD-10-CM | POA: Diagnosis not present

## 2018-06-07 DIAGNOSIS — F17210 Nicotine dependence, cigarettes, uncomplicated: Secondary | ICD-10-CM | POA: Insufficient documentation

## 2018-06-07 DIAGNOSIS — S066X9D Traumatic subarachnoid hemorrhage with loss of consciousness of unspecified duration, subsequent encounter: Secondary | ICD-10-CM | POA: Insufficient documentation

## 2018-06-07 DIAGNOSIS — M79672 Pain in left foot: Secondary | ICD-10-CM | POA: Diagnosis present
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
## 2018-06-07 NOTE — Patient Instructions (Addendum)
  WORK ON TAKING ON SOME RESPONSIBILITIES AT HOME: WASHING DISHES, DOING WASH, VACUUMING THE FLOOR, ETC.    YOU HAVE TO SIGNIFICANTLY LIMIT OR STOP YOUR ALCOHOL CONSUMPTION   USE IBUPROFEN OR ACETAMINOPHEN FOR PAIN

## 2018-11-17 ENCOUNTER — Emergency Department (HOSPITAL_COMMUNITY): Payer: BLUE CROSS/BLUE SHIELD

## 2018-11-17 ENCOUNTER — Encounter (HOSPITAL_COMMUNITY): Payer: Self-pay | Admitting: Emergency Medicine

## 2018-11-17 ENCOUNTER — Emergency Department (HOSPITAL_COMMUNITY)
Admission: EM | Admit: 2018-11-17 | Discharge: 2018-11-17 | Disposition: A | Discharge status: Home / Self Care | Payer: BLUE CROSS/BLUE SHIELD | Attending: Emergency Medicine | Admitting: Emergency Medicine

## 2018-11-17 ENCOUNTER — Other Ambulatory Visit: Payer: Self-pay

## 2018-11-17 DIAGNOSIS — F10930 Alcohol use, unspecified with withdrawal, uncomplicated: Secondary | ICD-10-CM

## 2018-11-17 DIAGNOSIS — Y999 Unspecified external cause status: Secondary | ICD-10-CM | POA: Diagnosis not present

## 2018-11-17 DIAGNOSIS — W19XXXA Unspecified fall, initial encounter: Secondary | ICD-10-CM | POA: Diagnosis not present

## 2018-11-17 DIAGNOSIS — Y92002 Bathroom of unspecified non-institutional (private) residence single-family (private) house as the place of occurrence of the external cause: Secondary | ICD-10-CM | POA: Diagnosis not present

## 2018-11-17 DIAGNOSIS — S0083XA Contusion of other part of head, initial encounter: Secondary | ICD-10-CM | POA: Insufficient documentation

## 2018-11-17 DIAGNOSIS — R569 Unspecified convulsions: Secondary | ICD-10-CM | POA: Insufficient documentation

## 2018-11-17 DIAGNOSIS — Y9389 Activity, other specified: Secondary | ICD-10-CM | POA: Insufficient documentation

## 2018-11-17 DIAGNOSIS — F10230 Alcohol dependence with withdrawal, uncomplicated: Secondary | ICD-10-CM | POA: Insufficient documentation

## 2018-11-17 DIAGNOSIS — F17210 Nicotine dependence, cigarettes, uncomplicated: Secondary | ICD-10-CM | POA: Diagnosis not present

## 2018-11-17 DIAGNOSIS — S0993XA Unspecified injury of face, initial encounter: Secondary | ICD-10-CM | POA: Diagnosis present

## 2018-11-17 LAB — CBC WITH DIFFERENTIAL/PLATELET
Abs Immature Granulocytes: 0.03 10*3/uL (ref 0.00–0.07)
Basophils Absolute: 0 10*3/uL (ref 0.0–0.1)
Basophils Relative: 0 %
Eosinophils Absolute: 0.1 10*3/uL (ref 0.0–0.5)
Eosinophils Relative: 2 %
HCT: 38.7 % — ABNORMAL LOW (ref 39.0–52.0)
Hemoglobin: 13.2 g/dL (ref 13.0–17.0)
Immature Granulocytes: 1 %
Lymphocytes Relative: 18 %
Lymphs Abs: 1.1 10*3/uL (ref 0.7–4.0)
MCH: 30.9 pg (ref 26.0–34.0)
MCHC: 34.1 g/dL (ref 30.0–36.0)
MCV: 90.6 fL (ref 80.0–100.0)
Monocytes Absolute: 0.7 10*3/uL (ref 0.1–1.0)
Monocytes Relative: 12 %
Neutro Abs: 4.2 10*3/uL (ref 1.7–7.7)
Neutrophils Relative %: 67 %
Platelets: 112 10*3/uL — ABNORMAL LOW (ref 150–400)
RBC: 4.27 MIL/uL (ref 4.22–5.81)
RDW: 16.4 % — ABNORMAL HIGH (ref 11.5–15.5)
WBC: 6.2 10*3/uL (ref 4.0–10.5)
nRBC: 0 % (ref 0.0–0.2)

## 2018-11-17 LAB — COMPREHENSIVE METABOLIC PANEL
ALT: 58 U/L — ABNORMAL HIGH (ref 0–44)
AST: 99 U/L — ABNORMAL HIGH (ref 15–41)
Albumin: 3.9 g/dL (ref 3.5–5.0)
Alkaline Phosphatase: 84 U/L (ref 38–126)
Anion gap: 13 (ref 5–15)
BUN: <5 mg/dL — ABNORMAL LOW (ref 6–20)
CO2: 22 mmol/L (ref 22–32)
Calcium: 9.1 mg/dL (ref 8.9–10.3)
Chloride: 101 mmol/L (ref 98–111)
Creatinine, Ser: 0.81 mg/dL (ref 0.61–1.24)
GFR calc Af Amer: >60 mL/min (ref >60)
GFR calc non Af Amer: >60 mL/min (ref >60)
Glucose, Bld: 186 mg/dL — ABNORMAL HIGH (ref 70–99)
Potassium: 3.1 mmol/L — ABNORMAL LOW (ref 3.5–5.1)
Sodium: 136 mmol/L (ref 135–145)
Total Bilirubin: 0.9 mg/dL (ref 0.3–1.2)
Total Protein: 6.9 g/dL (ref 6.5–8.1)

## 2018-11-17 LAB — CBG MONITORING, ED: Glucose-Capillary: 182 mg/dL — ABNORMAL HIGH (ref 70–99)

## 2018-11-17 MED ORDER — LACTATED RINGERS IV BOLUS
1000.0000 mL | Freq: Once | INTRAVENOUS | Status: AC
  Administered 2018-11-17: 1000 mL via INTRAVENOUS

## 2018-11-17 MED ORDER — LORAZEPAM 2 MG/ML IJ SOLN
1.0000 mg | Freq: Once | INTRAMUSCULAR | Status: AC
  Administered 2018-11-17: 1 mg via INTRAVENOUS
  Filled 2018-11-17: qty 1

## 2018-11-17 MED ORDER — CHLORDIAZEPOXIDE HCL 25 MG PO CAPS: ORAL_CAPSULE | ORAL | 0 refills | Status: DC

## 2018-11-17 NOTE — ED Provider Notes (Signed)
Emergency Department Provider Note   I have reviewed the triage vital signs and the nursing notes.   HISTORY  Chief Complaint Seizures   HPI Bradley Cummings is a 28 y.o. male with multiple medical problems document below who presents the emergency department today after seizure-like activity.  It was seen by a family member apparently.  Patient states that he remembers not feeling well and going to the bathroom and then remembers waking up later.  Reports that he had 2 episodes of full body shaking.  No incontinence, tongue biting or musculoskeletal pain.  Does have a headache with 2 knots on his head.   Patient states he drinks alcohol, multiple drinks a day but did not drink yesterday.  No history of seizures.  Does have a history of traumatic brain injury.  No other associated or modifying symptoms.    Past Medical History:  Diagnosis Date  . Eczema     Patient Active Problem List   Diagnosis Date Noted  . Left foot pain 04/05/2018  . Closed left scapular fracture 03/19/2018  . Left rib fracture 03/19/2018  . Headache due to trauma 03/19/2018  . Traumatic subdural hematoma ([place]) 03/18/2018  . SAH (subarachnoid hemorrhage) ([place]) 03/13/2018  . 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 Alcohol use disorder, moderate, dependence ([place]) 04/03/2016  . Cannabis use disorder, mild, abuse 04/03/2016    History reviewed. No pertinent surgical history.    Allergies Patient has no known allergies.  Family History  Problem Relation Age of Onset  . Mental illness Neg Hx     Social History Social History   Tobacco Use  . Smoking status: Current Every Day Smoker    Packs/day: 0.25    Years: 3.00    Pack years: 0.75    Types: Cigarettes  . Smokeless tobacco: Never Used  Substance Use Topics  . Alcohol use: Yes    Comment: 3-4 40oz beers a day  . Drug use: No    Review of Systems  All other systems negative except as  documented in the HPI. All pertinent positives and negatives as reviewed in the HPI. ____________________________________________   PHYSICAL EXAM:  VITAL SIGNS: Vitals:   11/17/18 0545 11/17/18 0600 11/17/18 0615 11/17/18 0630  BP: 134/87 (!) 134/96 (!) 134/93 (!) 138/94  Pulse: 94 98 93 97  Resp: (!) 22  20 20   Temp:      TempSrc:      SpO2: 99% 100% 100% 99%     Constitutional: Alert and oriented. Well appearing and in no acute distress. Eyes: Conjunctivae are normal. PERRL. EOMI. Head: large contusion to left forehead Nose: No congestion/rhinnorhea. Mouth/Throat: Mucous membranes are moist.  Oropharynx non-erythematous. Neck: No stridor.  No meningeal signs.   Cardiovascular: Normal rate, regular rhythm. Good peripheral circulation. Grossly normal heart sounds.   Respiratory: Normal respiratory effort.  No retractions. Lungs CTAB. Gastrointestinal: Soft and nontender. No distention.  Musculoskeletal: No lower extremity tenderness nor edema. No gross deformities of extremities. Neurologic:  Shaking all over. No altered mental status, able to give full seemingly accurate history.  Face is symmetric, EOM's intact, pupils equal and reactive, vision intact, tongue and uvula midline without deviation. Upper and Lower extremity motor 5/5, intact pain perception in distal extremities, 2+ reflexes in biceps, patella and achilles tendons. Able to perform finger to nose normal with both hands. Walks without assistance or evident ataxia.  Skin:  Skin is warm, dry and intact. No  rash noted.   ____________________________________________   LABS (all labs ordered are listed, but only abnormal results are displayed)  Labs Reviewed  CBC WITH DIFFERENTIAL/PLATELET - Abnormal; Notable for the following components:      Result Value   HCT 38.7 (*)    RDW 16.4 (*)    Platelets 112 (*)    All other components within normal limits  COMPREHENSIVE METABOLIC PANEL - Abnormal; Notable for the  following components:   Potassium 3.1 (*)    Glucose, Bld 186 (*)    BUN <5 (*)    AST 99 (*)    ALT 58 (*)    All other components within normal limits  CBG MONITORING, ED - Abnormal; Notable for the following components:   Glucose-Capillary 182 (*)    All other components within normal limits   ____________________________________________  EKG   EKG Interpretation  Date/Time:  Thursday November 17 2018 04:00:07 EDT Ventricular Rate:  108 PR Interval:    QRS Duration: 91 QT Interval:  328 QTC Calculation: 440 R Axis:   81 Text Interpretation:  Sinus tachycardia Borderline repolarization abnormality TWI new froim recent Confirmed by Marily MemosMesner, Emmalin Jaquess [phone]([id number]) on 11/17/2018 4:55:55 AM      ____________________________________________  RADIOLOGY  Ct Head Wo Contrast  Result Date: 11/17/2018 CLINICAL DATA:  Seizure.  Left supraorbital hematoma. EXAM: CT HEAD WITHOUT CONTRAST TECHNIQUE: Contiguous axial images were obtained from the base of the skull through the vertex without intravenous contrast. COMPARISON:  None. FINDINGS: Brain: No acute infarct, hemorrhage, or mass lesion is present. No significant white matter lesions are present. The ventricles are of normal size. No significant extraaxial fluid collection is present. Vascular: No hyperdense vessel or unexpected calcification. Skull: Calvarium is intact. No focal lytic or blastic lesions are present. Left supraorbital and temporal scalp hematoma is present without underlying fracture. No radiopaque foreign body is present. Sinuses/Orbits: The paranasal sinuses and mastoid air cells are clear. The globes and orbits are within normal limits. IMPRESSION: 1. Normal CT appearance the brain. No acute intracranial abnormality or seizure focus. 2. Left supraorbital and temporal scalp hematoma without underlying fracture. Electronically Signed   By: Marin Robertshristopher  Mattern M.D.   On: 11/17/2018 04:44    ____________________________________________    PROCEDURES  Procedure(s) performed:   Procedures   ____________________________________________   INITIAL IMPRESSION / ASSESSMENT AND PLAN / ED COURSE  Head trauma, history of TBI, alcohol use possibly seizure secondary to alcohol withdrawal but does not like he actually drinks that much.  Also could be intracranial injury so CT head done.  Sodium will be checked. Work-up overall unremarkable.  Patient still with some shaking even after the Ativan will give some fluids.  His heart rate is improved.   Patient still with some shakes.  Review the records it appears it may be drinks a little more than he initially admitted to could be related to that.  Will start Librium taper at home and encourage abstinence.  No indication for further work-up or imaging at this time.  Patient states he feels at baseline without any discomfort.  Pertinent labs & imaging results that were available during my care of the patient were reviewed by me and considered in my medical decision making (see chart for details).  A medical screening exam was performed and I feel the patient has had an appropriate workup for their chief complaint at this time and likelihood of emergent condition existing is low. They have been counseled on decision, discharge, follow up  and which symptoms necessitate immediate return to the emergency department. They or their family verbally stated understanding and agreement with plan and discharged in stable condition.   ____________________________________________  FINAL CLINICAL IMPRESSION(S) / ED DIAGNOSES  Final diagnoses:  Seizure-like activity (HCC)  Alcohol withdrawal syndrome without complication (HCC)     MEDICATIONS GIVEN DURING THIS VISIT:  Medications  LORazepam (ATIVAN) injection 1 mg (1 mg Intravenous Given 11/17/18 0428)  lactated ringers bolus 1,000 mL (0 mLs Intravenous Stopped 11/17/18 0644)     NEW OUTPATIENT MEDICATIONS STARTED DURING THIS VISIT:  New  Prescriptions   CHLORDIAZEPOXIDE (LIBRIUM) 25 MG CAPSULE    50mg  PO TID x 1D, then 25-50mg  PO BID X 1D, then 25-50mg  PO QD X 1D    Note:  This note was prepared with assistance of Dragon voice recognition software. Occasional wrong-word or sound-a-like substitutions may have occurred due to the inherent limitations of voice recognition software.   Maylon Sailors, Barbara CowerJason, MD 11/17/18 [phone]

## 2018-11-17 NOTE — ED Triage Notes (Signed)
Patient is from home, was normal around 0250, got up to get some water, family found him in the kitchen having seizures, 3 back to back.  Patient does have hematoma over left eye, no oral trauma, no incontinence, no seizure history.  Patient is hypertensive at 150/100.
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
## 2023-04-09 ENCOUNTER — Ambulatory Visit (HOSPITAL_COMMUNITY)
Admission: EM | Admit: 2023-04-09 | Discharge: 2023-04-09 | Disposition: A | Discharge status: Home / Self Care | Payer: Managed Care, Other (non HMO) | Attending: Family Medicine | Admitting: Family Medicine

## 2023-04-09 ENCOUNTER — Encounter (HOSPITAL_COMMUNITY): Payer: Self-pay

## 2023-04-09 DIAGNOSIS — R079 Chest pain, unspecified: Secondary | ICD-10-CM | POA: Diagnosis not present

## 2023-04-09 DIAGNOSIS — R600 Localized edema: Secondary | ICD-10-CM

## 2023-04-09 DIAGNOSIS — R0602 Shortness of breath: Secondary | ICD-10-CM | POA: Diagnosis not present

## 2023-04-09 NOTE — Discharge Instructions (Signed)
Due to your chest pain, shortness of breath and lower extremity edema I advise you to go to the emergency department for further evaluation.  They will be able to run stat lab work and do imaging as indicated.  Please head to [facility] emergency department upon leaving.

## 2023-04-09 NOTE — ED Notes (Signed)
Patient is being discharged from the Urgent Care and sent to the Emergency Department via self . Per provider, patient is in need of higher level of care due to chest pain, SOB, and foot swelling. Patient is aware and verbalizes understanding of plan of care.  Vitals:   04/09/23 1450  BP: 111/74  Pulse: (!) 102  Resp: 18  Temp: 98.2 F (36.8 C)  SpO2: 99%

## 2023-04-09 NOTE — ED Triage Notes (Addendum)
Pt presents with bilateral foot swelling and mid-chest pain at this time. Pt states "I noticed the foot swelling yesterday but my chest pain has been going on for about two weeks. It is in the middle of my chest, feels like it is burning, only when I walk long distances. If I am up just walking around the house, I feel no pain." Pt denies taking medications for symptoms. Pt does endorse "some" SOB but "it is only when I wake up in the middle of the night."

## 2023-04-09 NOTE — ED Provider Notes (Signed)
[facility]    CSN: [id number] Arrival date & time: 04/09/23  1235      History   Chief Complaint Chief Complaint  Patient presents with   Chest Pain   Bilateral Foot Swelling    HPI Bradley Cummings is a 32 y.o. male.   Patient presents to clinic for bilateral foot swelling and central chest pain.  His mother noticed the foot swelling yesterday.  Chest pain has been intermittent for the past 2 weeks.  He also wakes up in the middle of the night and finds himself short of breath.  He is also having chest pain with walking long distances.  He also has a dry nonproductive cough.  Denies any abdominal pain or swelling.  Has a history of chronic alcohol use and abuse.  His last drink was yesterday, reports drinking 1-2 beers per day.   The history is provided by the patient and medical records.  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 No pertinent surgical history.     Home Medications    Prior to Admission medications   Medication Sig Start Date End Date Taking? Authorizing Provider  chlordiazePOXIDE (LIBRIUM) 25 MG capsule 50mg  PO TID x 1D, then 25-50mg  PO BID X 1D, then 25-50mg  PO QD X 1D 11/17/18   Mesner, Barbara Cower, MD    Family History Family History  Problem Relation Age of Onset   Mental illness Neg Hx     Social History Social History   Tobacco Use   Smoking status: Every Day    Current packs/day: 0.25    Average  packs/day: 0.3 packs/day for 3.0 years (0.8 ttl pk-yrs)    Types: Cigarettes   Smokeless tobacco: Never  Vaping Use   Vaping status: Some Days  Substance Use Topics   Alcohol use: Yes    Comment: 3-4 40oz beers a day   Drug use: No     Allergies   Patient has no known allergies.   Review of Systems Review of Systems   Physical Exam Triage Vital Signs ED Triage Vitals  Encounter Vitals Group     BP 04/09/23 1450 111/74     Systolic BP Percentile --      Diastolic BP Percentile --      Pulse Rate 04/09/23 1450 (!) 102     Resp 04/09/23 1450 18     Temp 04/09/23 1450 98.2 F (36.8 C)     Temp Source 04/09/23 1450 Oral     SpO2 04/09/23 1450 99 %     Weight 04/09/23 1453 160 lb (72.6 kg)     Height 04/09/23 1453 5\' 11"  (1.803 m)     Head Circumference --      Peak Flow --      Pain Score 04/09/23 1453 0     Pain Loc --      Pain Education --      Exclude from Growth Chart --  No data found.  Updated Vital Signs BP 111/74 (BP Location: Right Arm)   Pulse (!) 102   Temp 98.2 F (36.8 C) (Oral)   Resp 18   Ht 5\' 11"  (1.803 m)   Wt 160 lb (72.6 kg)   SpO2 99%   BMI 22.32 kg/m   Visual Acuity Right Eye Distance:   Left Eye Distance:   Bilateral Distance:    Right Eye Near:   Left Eye Near:    Bilateral Near:     Physical Exam Vitals and nursing note reviewed.  Constitutional:      Appearance: Normal appearance.  HENT:     Head: Normocephalic and atraumatic.     Right Ear: External ear normal.     Left Ear: External ear normal.     Nose: Nose normal.     Mouth/Throat:     Mouth: Mucous membranes are moist.  Eyes:     Conjunctiva/sclera: Conjunctivae normal.  Cardiovascular:     Rate and Rhythm: Regular rhythm. Tachycardia present.     Heart sounds: Normal heart sounds. No murmur heard. Pulmonary:     Effort: Pulmonary effort is normal. No respiratory distress.     Breath sounds: Normal breath sounds.  Musculoskeletal:     Right lower leg:  2+ Pitting Edema present.     Left lower leg: 2+ Pitting Edema present.  Neurological:     General: No focal deficit present.     Mental Status: He is alert.  Psychiatric:        Mood and Affect: Mood normal.        Behavior: Behavior is cooperative.      UC Treatments / Results  Labs (all labs ordered are listed, but only abnormal results are displayed) Labs Reviewed - No data to display  EKG   Radiology No results found.  Procedures Procedures (including critical care time)  Medications Ordered in UC Medications - No data to display  Initial Impression / Assessment and Plan / UC Course  I have reviewed the triage vital signs and the nursing notes.  Pertinent labs & imaging results that were available during my care of the patient were reviewed by me and considered in my medical decision making (see chart for details).  Vitals and triage reviewed, patient is slightly tachycardic.  EKG shows rate of 101 with left axis deviation, no ST elevation or ST depression.  Bilateral 2+ pitting edema in feet and ankles.  Intermittent shortness of breath, dry cough and chest pain. Concern for alcohol-induced cardiomyopathy, heart failure or other acute changes.  Offered to start her workup here with chest x-ray and basic labs, but ultimately he might end up in the emergency department either way.  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  Risks of delayed treatment discussed, patient will head to the nearest emergency department.     Final Clinical Impressions(s) / UC Diagnoses   Final diagnoses:  Chest pain, unspecified type  Shortness of breath  Peripheral edema     Discharge Instructions      Due to your chest pain, shortness of breath and lower extremity edema I advise you to go to the emergency department for further evaluation.  They will be able to run stat lab work and do  imaging as indicated.  Please head to [facility] emergency department upon leaving.     ED Prescriptions  None    PDMP not reviewed this encounter.   Dezaree Tracey, [place], [place] 04/09/23 1531

## 2023-04-10 ENCOUNTER — Other Ambulatory Visit: Payer: Self-pay

## 2023-04-10 ENCOUNTER — Encounter (HOSPITAL_COMMUNITY): Payer: Self-pay

## 2023-04-10 ENCOUNTER — Emergency Department (HOSPITAL_COMMUNITY): Payer: Commercial Managed Care - HMO

## 2023-04-10 ENCOUNTER — Emergency Department (HOSPITAL_COMMUNITY)
Admission: EM | Admit: 2023-04-10 | Discharge: 2023-04-10 | Discharge status: Against Medical Advice | Payer: Commercial Managed Care - HMO | Attending: Emergency Medicine | Admitting: Emergency Medicine

## 2023-04-10 ENCOUNTER — Emergency Department (HOSPITAL_COMMUNITY): Admission: RE | Admit: 2023-04-10 | Payer: Managed Care, Other (non HMO) | Source: Ambulatory Visit

## 2023-04-10 DIAGNOSIS — R791 Abnormal coagulation profile: Secondary | ICD-10-CM | POA: Insufficient documentation

## 2023-04-10 DIAGNOSIS — R2243 Localized swelling, mass and lump, lower limb, bilateral: Secondary | ICD-10-CM | POA: Insufficient documentation

## 2023-04-10 DIAGNOSIS — R079 Chest pain, unspecified: Secondary | ICD-10-CM

## 2023-04-10 DIAGNOSIS — R0600 Dyspnea, unspecified: Secondary | ICD-10-CM | POA: Diagnosis not present

## 2023-04-10 DIAGNOSIS — R600 Localized edema: Secondary | ICD-10-CM

## 2023-04-10 DIAGNOSIS — R790 Abnormal level of blood mineral: Secondary | ICD-10-CM | POA: Insufficient documentation

## 2023-04-10 DIAGNOSIS — R7989 Other specified abnormal findings of blood chemistry: Secondary | ICD-10-CM

## 2023-04-10 DIAGNOSIS — R0602 Shortness of breath: Secondary | ICD-10-CM | POA: Diagnosis not present

## 2023-04-10 LAB — CBC
HCT: 38.4 % — ABNORMAL LOW (ref 39.0–52.0)
Hemoglobin: 12.6 g/dL — ABNORMAL LOW (ref 13.0–17.0)
MCH: 31 pg (ref 26.0–34.0)
MCHC: 32.8 g/dL (ref 30.0–36.0)
MCV: 94.6 fL (ref 80.0–100.0)
Platelets: 155 10*3/uL (ref 150–400)
RBC: 4.06 MIL/uL — ABNORMAL LOW (ref 4.22–5.81)
RDW: 14 % (ref 11.5–15.5)
WBC: 3.8 10*3/uL — ABNORMAL LOW (ref 4.0–10.5)
nRBC: 0 % (ref 0.0–0.2)

## 2023-04-10 LAB — TROPONIN I (HIGH SENSITIVITY)
Troponin I (High Sensitivity): 9 ng/L (ref <18)
Troponin I (High Sensitivity): 9 ng/L (ref <18)

## 2023-04-10 LAB — BASIC METABOLIC PANEL
Anion gap: 8 (ref 5–15)
BUN: 8 mg/dL (ref 6–20)
CO2: 23 mmol/L (ref 22–32)
Calcium: 8.7 mg/dL — ABNORMAL LOW (ref 8.9–10.3)
Chloride: 107 mmol/L (ref 98–111)
Creatinine, Ser: 0.77 mg/dL (ref 0.61–1.24)
GFR, Estimated: >60 mL/min (ref >60)
Glucose, Bld: 110 mg/dL — ABNORMAL HIGH (ref 70–99)
Potassium: 3.9 mmol/L (ref 3.5–5.1)
Sodium: 138 mmol/L (ref 135–145)

## 2023-04-10 LAB — D-DIMER, QUANTITATIVE: D-Dimer, Quant: 1.04 ug{FEU}/mL — ABNORMAL HIGH (ref 0.00–0.50)

## 2023-04-10 LAB — BRAIN NATRIURETIC PEPTIDE: B Natriuretic Peptide: 2849.5 pg/mL — ABNORMAL HIGH (ref 0.0–100.0)

## 2023-04-10 MED ORDER — FUROSEMIDE 20 MG PO TABS
40.0000 mg | ORAL_TABLET | Freq: Once | ORAL | Status: AC
  Administered 2023-04-10: 40 mg via ORAL
  Filled 2023-04-10: qty 2

## 2023-04-10 NOTE — ED Provider Notes (Signed)
[place] EMERGENCY DEPARTMENT AT [facility] Provider Note   CSN: [id number] Arrival date & time: 04/10/23  2956     History  Chief Complaint  Patient presents with   Chest Pain    Bradley Cummings is a 32 y.o. male, hx of alcohol use disorder, who presents to the ED 2/2 to bilateral lower extremity swelling, as well as shortness of breath, and chest pain has been going on for 2 weeks.  He notes that his chest pain, is worse when walking, and he has shortness of breath with this, especially on exertion, or when he is laying down.  He states he wakes up in the middle of the night, short of breath, reports drinking about 24 ounces of beer daily.  Denies any IV drug use.  Has not had any abdominal swelling, nausea, vomiting, or diarrhea.    Home Medications Prior to Admission medications   Medication Sig Start Date End Date Taking? Authorizing Provider  Acetaminophen (TYLENOL PO) Take 3 tablets by mouth daily as needed (pain).   Yes [provider]      Allergies    Patient has no known allergies.    Review of Systems   Review of Systems  Constitutional:  Negative for fever.  Respiratory:  Positive for shortness of breath.   Cardiovascular:  Positive for chest pain.    Physical Exam Updated Vital Signs BP (!) 120/94 (BP Location: Right Arm)   Pulse (!) 103   Temp 98.1 F (36.7 C) (Oral)   Resp 18   Ht 5\' 10"  (1.778 m)   Wt 72.6 kg   SpO2 99%   BMI 22.96 kg/m  Physical Exam Vitals and nursing note reviewed.  Constitutional:      General: He is not in acute distress.    Appearance: He is well-developed.  HENT:     Head: Normocephalic and atraumatic.  Eyes:     Conjunctiva/sclera: Conjunctivae normal.  Cardiovascular:     Rate and Rhythm: Normal rate and regular rhythm.     Heart sounds: No murmur heard. Pulmonary:     Effort: Pulmonary effort is normal. No respiratory distress.     Breath sounds: Normal breath sounds.  Abdominal:     Palpations:  Abdomen is soft.     Tenderness: There is no abdominal tenderness.  Musculoskeletal:        General: No swelling.     Cervical back: Neck supple.     Right lower leg: Edema present.     Left lower leg: Edema present.  Skin:    General: Skin is warm and dry.     Capillary Refill: Capillary refill takes less than 2 seconds.  Neurological:     Mental Status: He is alert.  Psychiatric:        Mood and Affect: Mood normal.     ED Results / Procedures / Treatments   Labs (all labs ordered are listed, but only abnormal results are displayed) Labs Reviewed  BASIC METABOLIC PANEL - Abnormal; Notable for the following components:      Result Value   Glucose, Bld 110 (*)    Calcium 8.7 (*)    All other components within normal limits  CBC - Abnormal; Notable for the following components:   WBC 3.8 (*)    RBC 4.06 (*)    Hemoglobin 12.6 (*)    HCT 38.4 (*)    All other components within normal limits  BRAIN NATRIURETIC PEPTIDE - Abnormal; Notable  for the following components:   B Natriuretic Peptide 2,849.5 (*)    All other components within normal limits  D-DIMER, QUANTITATIVE - Abnormal; Notable for the following components:   D-Dimer, Quant 1.04 (*)    All other components within normal limits  TROPONIN I (HIGH SENSITIVITY)  TROPONIN I (HIGH SENSITIVITY)    EKG EKG Interpretation Date/Time:  Saturday April 10 2023 09:34:47 EST Ventricular Rate:  108 PR Interval:  180 QRS Duration:  88 QT Interval:  314 QTC Calculation: 420 R Axis:   -50  Text Interpretation: Sinus tachycardia Left anterior fascicular block T wave abnormality, consider lateral ischemia Abnormal ECG When compared with ECG of 09-Apr-2023 15:03, PREVIOUS ECG IS PRESENT Confirmed by Virgina Norfolk [phone]) on 04/10/2023 11:19:19 AM  Radiology DG Chest 2 View  Result Date: 04/10/2023 CLINICAL DATA:  Chest pain EXAM: CHEST - 2 VIEW COMPARISON:  03/13/2018 FINDINGS: The cardio pericardial silhouette is  enlarged. The lungs are clear without focal pneumonia, edema, pneumothorax or pleural effusion. No acute bony abnormality. IMPRESSION: Enlargement of the cardiopericardial silhouette without acute cardiopulmonary findings. Electronically Signed   By: Kennith Center M.D.   On: 04/10/2023 10:33    Procedures Procedures    Medications Ordered in ED Medications  furosemide (LASIX) tablet 40 mg (40 mg Oral Given 04/10/23 1157)    ED Course/ Medical Decision Making/ A&P                                 Medical Decision Making Patient is a 32 year old male, here for swelling of the legs, shortness of breath and chest pain has been gone for the last 2 weeks.  He does drink about 24 ounces of beer a day.  Has never had this happen before.  He has 2+ pitting edema bilateral lower extremities, and shortness of breath and chest pain we will obtain a D-dimer, BNP, chest x-ray and troponins for further evaluation.  Amount and/or Complexity of Data Reviewed Labs: ordered.    Details: Dimer elevated to 1+, BNP markedly elevated, otherwise unremarkable labs, troponin negative Radiology: ordered.    Details: Chest x-ray shows enlargement of the cardiopericardial silhouette ECG/medicine tests:  Decision-making details documented in ED Course. Discussion of management or test interpretation with external provider(s): Discussed with patient, cardiac silhouette enlarged, BNP, elevated as well as dimer.  I recommended a CTA PE study, to rule out a blood clot causing heart failure, as I am suspicious that he may be developing heart failure.  He adamantly refused, we discussed the risk associated with this, and that I recommended admission, and he declined.  He understands the risk associated with this and is welcome to return anytime.  I sent a referral to cardiology, and we discussed return precautions patient voiced understanding.  Left AMA  Risk Prescription drug management.  Final Clinical Impression(s) / ED  Diagnoses Final diagnoses:  Edema of both legs  Elevated brain natriuretic peptide (BNP) level  Chest pain, unspecified type  Dyspnea, unspecified type  Elevated d-dimer    Rx / DC Orders ED Discharge Orders          Ordered    Ambulatory referral to Cardiology       Comments: If you have not heard from the Cardiology office within the next 72 hours please call [phone].   04/10/23 1335              Makai Dumond, Harley [place], PA  04/10/23 1517    Virgina Norfolk, DO 04/11/23 1441

## 2023-04-10 NOTE — Discharge Instructions (Addendum)
Please follow-up with your primary care doctor, if you do not have a primary care doctor, call the number below.  I referred you to cardiology because I believe that you need an echocardiogram.  I am suspicious that you may have heart failure, for a blood clot that is causing some heart failure.  You decided to leave AGAINST MEDICAL ADVICE, you understand the risk associated with this, and are still wanting to leave.  You may always come back, we are happy to see you

## 2023-04-10 NOTE — ED Triage Notes (Signed)
Pt c/o bilateral foot pain and swelling. Pt also having shortness of breath and chest pain. Pt seen at [facility] yesterday and told to come to ED.

## 2023-04-10 NOTE — ED Notes (Signed)
Patient refused CT and any further treatment, tsating he was ready to go. I witnessed PA Small go over risks of leaving with patient. AMA form signed. Patient refusing vitals before DC

## 2023-05-30 NOTE — Progress Notes (Deleted)
°  Cardiology Office Note:  .   Date:  05/30/2023  ID:  Bradley Cummings, DOB [birth year], MRN [id number] PCP: Patient, No Pcp Per  [facility] Providers Cardiologist:  None { Click to update primary MD,subspecialty MD or APP then REFRESH:1}   History of Present Illness: .   Bradley Cummings is a 32 y.o. male with history of etoh use who presents for the evaluation of SOB/LE edema at the request of Small, Brooke L, PA.    ***{There is no content from the last Narrative History section.}    ROS: All other ROS reviewed and negative. Pertinent positives noted in the HPI.     Studies Reviewed: Marland Kitchen       Physical Exam:   VS:  There were no vitals taken for this visit.   Wt Readings from Last 3 Encounters:  04/10/23 160 lb (72.6 kg)  04/09/23 160 lb (72.6 kg)  06/07/18 155 lb (70.3 kg)    GEN: Well nourished, well developed in no acute distress NECK: No JVD; No carotid bruits CARDIAC: ***RRR, no murmurs, rubs, gallops RESPIRATORY:  Clear to auscultation without rales, wheezing or rhonchi  ABDOMEN: Soft, non-tender, non-distended EXTREMITIES:  No edema; No deformity  ASSESSMENT AND PLAN: .   ***    {Are you ordering a CV Procedure (e.g. stress test, cath, DCCV, TEE, etc)?   Press F2        :[id number]}   Follow-up: No follow-ups on file.  Time Spent with Patient: I have spent a total of *** minutes caring for this patient today face to face, ordering and reviewing labs/tests, reviewing prior records/medical history, examining the patient, establishing an assessment and plan, communicating results/findings to the patient/family, and documenting in the medical record.   Signed, Lenna Gilford. Flora Lipps, MD, [facility]  [facility]  [address] [place], [place] [address] [phone]  8:16 PM

## 2023-06-03 ENCOUNTER — Ambulatory Visit: Payer: Managed Care, Other (non HMO) | Attending: Cardiovascular Disease | Admitting: Cardiovascular Disease

## 2023-06-03 DIAGNOSIS — R600 Localized edema: Secondary | ICD-10-CM

## 2023-06-03 DIAGNOSIS — R0602 Shortness of breath: Secondary | ICD-10-CM

## 2023-06-03 DIAGNOSIS — I5021 Acute systolic (congestive) heart failure: Secondary | ICD-10-CM

## 2023-06-04 ENCOUNTER — Encounter: Payer: Self-pay | Admitting: Cardiovascular Disease

## 2023-07-30 ENCOUNTER — Encounter: Payer: Self-pay | Admitting: Cardiology

## 2023-09-06 ENCOUNTER — Ambulatory Visit

## 2023-09-09 ENCOUNTER — Ambulatory Visit: Payer: Commercial Managed Care - HMO | Admitting: Cardiology

## 2023-10-15 ENCOUNTER — Ambulatory Visit (HOSPITAL_COMMUNITY): Admission: EM | Admit: 2023-10-15 | Discharge: 2023-10-15 | Disposition: A | Discharge status: Home / Self Care

## 2023-10-15 ENCOUNTER — Inpatient Hospital Stay (HOSPITAL_COMMUNITY)

## 2023-10-15 ENCOUNTER — Emergency Department (HOSPITAL_COMMUNITY)

## 2023-10-15 ENCOUNTER — Inpatient Hospital Stay (HOSPITAL_COMMUNITY)
Admission: EM | Admit: 2023-10-15 | Discharge: 2023-10-23 | DRG: 314 | Disposition: A | Discharge status: Home / Self Care | Attending: Internal Medicine | Admitting: Internal Medicine

## 2023-10-15 ENCOUNTER — Other Ambulatory Visit: Payer: Self-pay

## 2023-10-15 ENCOUNTER — Encounter (HOSPITAL_COMMUNITY): Payer: Self-pay | Admitting: Pulmonary Disease

## 2023-10-15 ENCOUNTER — Encounter (HOSPITAL_COMMUNITY): Payer: Self-pay

## 2023-10-15 DIAGNOSIS — R4182 Altered mental status, unspecified: Secondary | ICD-10-CM | POA: Diagnosis not present

## 2023-10-15 DIAGNOSIS — E876 Hypokalemia: Secondary | ICD-10-CM | POA: Diagnosis present

## 2023-10-15 DIAGNOSIS — K7010 Alcoholic hepatitis without ascites: Secondary | ICD-10-CM | POA: Diagnosis not present

## 2023-10-15 DIAGNOSIS — F331 Major depressive disorder, recurrent, moderate: Secondary | ICD-10-CM | POA: Diagnosis present

## 2023-10-15 DIAGNOSIS — E875 Hyperkalemia: Secondary | ICD-10-CM | POA: Diagnosis present

## 2023-10-15 DIAGNOSIS — R278 Other lack of coordination: Secondary | ICD-10-CM | POA: Diagnosis present

## 2023-10-15 DIAGNOSIS — I5082 Biventricular heart failure: Secondary | ICD-10-CM | POA: Diagnosis present

## 2023-10-15 DIAGNOSIS — F1010 Alcohol abuse, uncomplicated: Secondary | ICD-10-CM | POA: Diagnosis present

## 2023-10-15 DIAGNOSIS — E872 Acidosis, unspecified: Secondary | ICD-10-CM | POA: Diagnosis present

## 2023-10-15 DIAGNOSIS — I509 Heart failure, unspecified: Secondary | ICD-10-CM | POA: Diagnosis not present

## 2023-10-15 DIAGNOSIS — F1029 Alcohol dependence with unspecified alcohol-induced disorder: Secondary | ICD-10-CM

## 2023-10-15 DIAGNOSIS — E440 Moderate protein-calorie malnutrition: Secondary | ICD-10-CM | POA: Diagnosis present

## 2023-10-15 DIAGNOSIS — Y92239 Unspecified place in hospital as the place of occurrence of the external cause: Secondary | ICD-10-CM | POA: Diagnosis present

## 2023-10-15 DIAGNOSIS — K760 Fatty (change of) liver, not elsewhere classified: Secondary | ICD-10-CM | POA: Diagnosis present

## 2023-10-15 DIAGNOSIS — T502X5A Adverse effect of carbonic-anhydrase inhibitors, benzothiadiazides and other diuretics, initial encounter: Secondary | ICD-10-CM | POA: Diagnosis present

## 2023-10-15 DIAGNOSIS — R748 Abnormal levels of other serum enzymes: Secondary | ICD-10-CM | POA: Diagnosis not present

## 2023-10-15 DIAGNOSIS — F10239 Alcohol dependence with withdrawal, unspecified: Secondary | ICD-10-CM | POA: Diagnosis present

## 2023-10-15 DIAGNOSIS — F1090 Alcohol use, unspecified, uncomplicated: Secondary | ICD-10-CM | POA: Diagnosis not present

## 2023-10-15 DIAGNOSIS — K7682 Hepatic encephalopathy: Secondary | ICD-10-CM | POA: Diagnosis present

## 2023-10-15 DIAGNOSIS — I5021 Acute systolic (congestive) heart failure: Secondary | ICD-10-CM | POA: Diagnosis not present

## 2023-10-15 DIAGNOSIS — Z87820 Personal history of traumatic brain injury: Secondary | ICD-10-CM

## 2023-10-15 DIAGNOSIS — G9341 Metabolic encephalopathy: Secondary | ICD-10-CM | POA: Diagnosis present

## 2023-10-15 DIAGNOSIS — I340 Nonrheumatic mitral (valve) insufficiency: Secondary | ICD-10-CM | POA: Diagnosis present

## 2023-10-15 DIAGNOSIS — I472 Ventricular tachycardia, unspecified: Secondary | ICD-10-CM | POA: Diagnosis present

## 2023-10-15 DIAGNOSIS — R7401 Elevation of levels of liver transaminase levels: Secondary | ICD-10-CM | POA: Diagnosis not present

## 2023-10-15 DIAGNOSIS — F17210 Nicotine dependence, cigarettes, uncomplicated: Secondary | ICD-10-CM | POA: Diagnosis present

## 2023-10-15 DIAGNOSIS — R079 Chest pain, unspecified: Secondary | ICD-10-CM | POA: Diagnosis not present

## 2023-10-15 DIAGNOSIS — G621 Alcoholic polyneuropathy: Secondary | ICD-10-CM | POA: Diagnosis present

## 2023-10-15 DIAGNOSIS — I429 Cardiomyopathy, unspecified: Secondary | ICD-10-CM | POA: Diagnosis not present

## 2023-10-15 DIAGNOSIS — F1020 Alcohol dependence, uncomplicated: Secondary | ICD-10-CM | POA: Diagnosis not present

## 2023-10-15 DIAGNOSIS — E870 Hyperosmolality and hypernatremia: Secondary | ICD-10-CM | POA: Diagnosis present

## 2023-10-15 DIAGNOSIS — I5023 Acute on chronic systolic (congestive) heart failure: Secondary | ICD-10-CM | POA: Diagnosis present

## 2023-10-15 DIAGNOSIS — K7200 Acute and subacute hepatic failure without coma: Secondary | ICD-10-CM | POA: Diagnosis present

## 2023-10-15 DIAGNOSIS — K7011 Alcoholic hepatitis with ascites: Secondary | ICD-10-CM | POA: Diagnosis present

## 2023-10-15 DIAGNOSIS — R600 Localized edema: Secondary | ICD-10-CM | POA: Diagnosis not present

## 2023-10-15 DIAGNOSIS — Z681 Body mass index (BMI) 19 or less, adult: Secondary | ICD-10-CM

## 2023-10-15 DIAGNOSIS — E871 Hypo-osmolality and hyponatremia: Secondary | ICD-10-CM | POA: Diagnosis present

## 2023-10-15 DIAGNOSIS — I426 Alcoholic cardiomyopathy: Secondary | ICD-10-CM | POA: Diagnosis present

## 2023-10-15 DIAGNOSIS — I959 Hypotension, unspecified: Secondary | ICD-10-CM | POA: Diagnosis present

## 2023-10-15 DIAGNOSIS — Z79899 Other long term (current) drug therapy: Secondary | ICD-10-CM | POA: Diagnosis not present

## 2023-10-15 DIAGNOSIS — R400 Somnolence: Secondary | ICD-10-CM | POA: Diagnosis not present

## 2023-10-15 DIAGNOSIS — N179 Acute kidney failure, unspecified: Secondary | ICD-10-CM

## 2023-10-15 DIAGNOSIS — Z6822 Body mass index (BMI) 22.0-22.9, adult: Secondary | ICD-10-CM

## 2023-10-15 DIAGNOSIS — D509 Iron deficiency anemia, unspecified: Secondary | ICD-10-CM | POA: Diagnosis present

## 2023-10-15 DIAGNOSIS — I371 Nonrheumatic pulmonary valve insufficiency: Secondary | ICD-10-CM | POA: Diagnosis present

## 2023-10-15 DIAGNOSIS — R1011 Right upper quadrant pain: Secondary | ICD-10-CM | POA: Diagnosis present

## 2023-10-15 DIAGNOSIS — I5020 Unspecified systolic (congestive) heart failure: Secondary | ICD-10-CM | POA: Diagnosis not present

## 2023-10-15 DIAGNOSIS — F17290 Nicotine dependence, other tobacco product, uncomplicated: Secondary | ICD-10-CM | POA: Diagnosis present

## 2023-10-15 DIAGNOSIS — I517 Cardiomegaly: Secondary | ICD-10-CM | POA: Insufficient documentation

## 2023-10-15 DIAGNOSIS — E561 Deficiency of vitamin K: Secondary | ICD-10-CM | POA: Diagnosis present

## 2023-10-15 DIAGNOSIS — F410 Panic disorder [episodic paroxysmal anxiety] without agoraphobia: Secondary | ICD-10-CM | POA: Diagnosis present

## 2023-10-15 DIAGNOSIS — Z604 Social exclusion and rejection: Secondary | ICD-10-CM | POA: Diagnosis present

## 2023-10-15 HISTORY — DX: Alcoholic hepatitis without ascites: K70.10

## 2023-10-15 HISTORY — DX: Tobacco use: Z72.0

## 2023-10-15 HISTORY — DX: Alcohol dependence, uncomplicated: F10.20

## 2023-10-15 HISTORY — DX: Alcohol abuse, uncomplicated: F10.10

## 2023-10-15 HISTORY — DX: Unspecified systolic (congestive) heart failure: I50.20

## 2023-10-15 HISTORY — DX: Hypo-osmolality and hyponatremia: E87.1

## 2023-10-15 HISTORY — DX: Major depressive disorder, single episode, unspecified: F32.9

## 2023-10-15 HISTORY — DX: Alcohol use, unspecified with withdrawal, unspecified: F10.939

## 2023-10-15 LAB — RAPID URINE DRUG SCREEN, HOSP PERFORMED
Amphetamines: NOT DETECTED
Barbiturates: NOT DETECTED
Benzodiazepines: NOT DETECTED
Cocaine: NOT DETECTED
Opiates: NOT DETECTED
Tetrahydrocannabinol: NOT DETECTED

## 2023-10-15 LAB — URINALYSIS, ROUTINE W REFLEX MICROSCOPIC
Bilirubin Urine: NEGATIVE
Glucose, UA: NEGATIVE mg/dL
Ketones, ur: NEGATIVE mg/dL
Leukocytes,Ua: NEGATIVE
Nitrite: NEGATIVE
Protein, ur: NEGATIVE mg/dL
Specific Gravity, Urine: 1.004 — ABNORMAL LOW (ref 1.005–1.030)
pH: 5 (ref 5.0–8.0)

## 2023-10-15 LAB — CBC
HCT: 32.5 % — ABNORMAL LOW (ref 39.0–52.0)
Hemoglobin: 11.3 g/dL — ABNORMAL LOW (ref 13.0–17.0)
MCH: 28.6 pg (ref 26.0–34.0)
MCHC: 34.8 g/dL (ref 30.0–36.0)
MCV: 82.3 fL (ref 80.0–100.0)
Platelets: 176 10*3/uL (ref 150–400)
RBC: 3.95 MIL/uL — ABNORMAL LOW (ref 4.22–5.81)
RDW: 19.4 % — ABNORMAL HIGH (ref 11.5–15.5)
WBC: 7.2 10*3/uL (ref 4.0–10.5)
nRBC: 2.4 % — ABNORMAL HIGH (ref 0.0–0.2)

## 2023-10-15 LAB — I-STAT ARTERIAL BLOOD GAS, ED
Acid-base deficit: 6 mmol/L — ABNORMAL HIGH (ref 0.0–2.0)
Bicarbonate: 17.5 mmol/L — ABNORMAL LOW (ref 20.0–28.0)
Calcium, Ion: 1.09 mmol/L — ABNORMAL LOW (ref 1.15–1.40)
HCT: 38 % — ABNORMAL LOW (ref 39.0–52.0)
Hemoglobin: 12.9 g/dL — ABNORMAL LOW (ref 13.0–17.0)
O2 Saturation: 95 %
Patient temperature: 98.7
Potassium: 5 mmol/L (ref 3.5–5.1)
Sodium: 114 mmol/L — CL (ref 135–145)
TCO2: 18 mmol/L — ABNORMAL LOW (ref 22–32)
pCO2 arterial: 27 mmHg — ABNORMAL LOW (ref 32–48)
pH, Arterial: 7.42 (ref 7.35–7.45)
pO2, Arterial: 73 mmHg — ABNORMAL LOW (ref 83–108)

## 2023-10-15 LAB — HEPATIC FUNCTION PANEL
ALT: 694 U/L — ABNORMAL HIGH (ref 0–44)
AST: 1620 U/L — ABNORMAL HIGH (ref 15–41)
Albumin: 3.3 g/dL — ABNORMAL LOW (ref 3.5–5.0)
Alkaline Phosphatase: 128 U/L — ABNORMAL HIGH (ref 38–126)
Bilirubin, Direct: 2 mg/dL — ABNORMAL HIGH (ref 0.0–0.2)
Indirect Bilirubin: 2.9 mg/dL — ABNORMAL HIGH (ref 0.3–0.9)
Total Bilirubin: 4.9 mg/dL — ABNORMAL HIGH (ref 0.0–1.2)
Total Protein: 7.5 g/dL (ref 6.5–8.1)

## 2023-10-15 LAB — BASIC METABOLIC PANEL WITH GFR
Anion gap: 15 (ref 5–15)
BUN: 32 mg/dL — ABNORMAL HIGH (ref 6–20)
CO2: 19 mmol/L — ABNORMAL LOW (ref 22–32)
Calcium: 8.6 mg/dL — ABNORMAL LOW (ref 8.9–10.3)
Chloride: 80 mmol/L — ABNORMAL LOW (ref 98–111)
Creatinine, Ser: 1.57 mg/dL — ABNORMAL HIGH (ref 0.61–1.24)
GFR, Estimated: 60 mL/min — ABNORMAL LOW (ref >60)
Glucose, Bld: 107 mg/dL — ABNORMAL HIGH (ref 70–99)
Potassium: 5 mmol/L (ref 3.5–5.1)
Sodium: 114 mmol/L — CL (ref 135–145)

## 2023-10-15 LAB — BRAIN NATRIURETIC PEPTIDE: B Natriuretic Peptide: >4500 pg/mL — ABNORMAL HIGH (ref 0.0–100.0)

## 2023-10-15 LAB — ETHANOL: Alcohol, Ethyl (B): <15 mg/dL (ref <15)

## 2023-10-15 LAB — TROPONIN I (HIGH SENSITIVITY)
Troponin I (High Sensitivity): 13 ng/L (ref <18)
Troponin I (High Sensitivity): 10 ng/L (ref <18)

## 2023-10-15 LAB — AMMONIA: Ammonia: 35 umol/L (ref 9–35)

## 2023-10-15 LAB — TSH: TSH: 2.886 u[IU]/mL (ref 0.350–4.500)

## 2023-10-15 LAB — SODIUM: Sodium: 116 mmol/L — CL (ref 135–145)

## 2023-10-15 LAB — MRSA NEXT GEN BY PCR, NASAL: MRSA by PCR Next Gen: NOT DETECTED

## 2023-10-15 LAB — OSMOLALITY, URINE: Osmolality, Ur: 196 mosm/kg — ABNORMAL LOW (ref 300–900)

## 2023-10-15 MED ORDER — FOLIC ACID 1 MG PO TABS
1.0000 mg | ORAL_TABLET | Freq: Every day | ORAL | Status: DC
  Administered 2023-10-15 – 2023-10-23 (×9): 1 mg via ORAL
  Filled 2023-10-15 (×9): qty 1

## 2023-10-15 MED ORDER — THIAMINE MONONITRATE 100 MG PO TABS
100.0000 mg | ORAL_TABLET | Freq: Every day | ORAL | Status: DC
Start: 2023-10-15 — End: 2023-10-23
  Administered 2023-10-15 – 2023-10-23 (×9): 100 mg via ORAL
  Filled 2023-10-15 (×9): qty 1

## 2023-10-15 MED ORDER — SODIUM CHLORIDE 0.9% FLUSH
10.0000 mL | Freq: Two times a day (BID) | INTRAVENOUS | Status: DC
  Administered 2023-10-16 – 2023-10-17 (×3): 10 mL
  Administered 2023-10-17: 20 mL
  Administered 2023-10-18 – 2023-10-23 (×11): 10 mL

## 2023-10-15 MED ORDER — SODIUM CHLORIDE 0.9% FLUSH: 10.0000 mL | INTRAVENOUS | Status: DC | PRN

## 2023-10-15 MED ORDER — LORAZEPAM 1 MG PO TABS: 1.0000 mg | ORAL_TABLET | ORAL | Status: DC | PRN

## 2023-10-15 MED ORDER — SODIUM CHLORIDE 3 % IV BOLUS
100.0000 mL | Freq: Once | INTRAVENOUS | Status: AC
  Administered 2023-10-15: 100 mL via INTRAVENOUS
  Filled 2023-10-15: qty 500

## 2023-10-15 MED ORDER — LORAZEPAM 2 MG/ML IJ SOLN: 1.0000 mg | INTRAMUSCULAR | Status: DC | PRN

## 2023-10-15 MED ORDER — FUROSEMIDE 10 MG/ML IJ SOLN
40.0000 mg | Freq: Once | INTRAMUSCULAR | Status: AC
  Administered 2023-10-15: 40 mg via INTRAVENOUS
  Filled 2023-10-15: qty 4

## 2023-10-15 MED ORDER — CHLORHEXIDINE GLUCONATE CLOTH 2 % EX PADS
6.0000 | MEDICATED_PAD | Freq: Every day | CUTANEOUS | Status: DC
  Administered 2023-10-15 – 2023-10-23 (×10): 6 via TOPICAL

## 2023-10-15 MED ORDER — DOCUSATE SODIUM 100 MG PO CAPS: 100.0000 mg | ORAL_CAPSULE | Freq: Two times a day (BID) | ORAL | Status: DC | PRN

## 2023-10-15 MED ORDER — ORAL CARE MOUTH RINSE: 15.0000 mL | OROMUCOSAL | Status: DC | PRN

## 2023-10-15 MED ORDER — FUROSEMIDE 10 MG/ML IJ SOLN
40.0000 mg | Freq: Two times a day (BID) | INTRAMUSCULAR | Status: DC
  Administered 2023-10-15 – 2023-10-17 (×5): 40 mg via INTRAVENOUS
  Filled 2023-10-15 (×6): qty 4

## 2023-10-15 MED ORDER — NICOTINE 21 MG/24HR TD PT24
21.0000 mg | MEDICATED_PATCH | Freq: Every day | TRANSDERMAL | Status: DC
  Administered 2023-10-16 – 2023-10-20 (×5): 21 mg via TRANSDERMAL
  Filled 2023-10-15 (×5): qty 1

## 2023-10-15 MED ORDER — LORAZEPAM 2 MG/ML IJ SOLN
1.0000 mg | Freq: Once | INTRAMUSCULAR | Status: AC
  Administered 2023-10-15: 1 mg via INTRAVENOUS
  Filled 2023-10-15: qty 1

## 2023-10-15 MED ORDER — ALBUTEROL SULFATE (2.5 MG/3ML) 0.083% IN NEBU: 2.5000 mg | INHALATION_SOLUTION | RESPIRATORY_TRACT | Status: DC | PRN

## 2023-10-15 MED ORDER — THIAMINE HCL 100 MG/ML IJ SOLN
100.0000 mg | Freq: Every day | INTRAMUSCULAR | Status: DC
Start: 2023-10-15 — End: 2023-10-17

## 2023-10-15 MED ORDER — ADULT MULTIVITAMIN W/MINERALS CH
1.0000 | ORAL_TABLET | Freq: Every day | ORAL | Status: DC
  Administered 2023-10-15 – 2023-10-23 (×9): 1 via ORAL
  Filled 2023-10-15 (×9): qty 1

## 2023-10-15 MED ORDER — POLYETHYLENE GLYCOL 3350 17 G PO PACK: 17.0000 g | PACK | Freq: Every day | ORAL | Status: DC | PRN

## 2023-10-15 MED ORDER — THIAMINE HCL 100 MG/ML IJ SOLN
500.0000 mg | Freq: Once | INTRAVENOUS | Status: DC
  Filled 2023-10-15: qty 5

## 2023-10-15 NOTE — Consult Note (Signed)
 Patient Demographics  Bradley Cummings, is a 33 y.o. male   MRN: [id number]   DOB - 07-30-1990  Admit Date - 10/15/2023    Outpatient Primary MD for the patient is Patient, No Pcp Per  Consult requested in the Hospital by Aleksandar Duve, Ardia Kraft, MD, On 10/15/2023    Reason for consult : Evaluate for admission   With History of -  Past Medical History:  Diagnosis Date   Eczema       No past surgical history on file.  in for   Chief Complaint  Patient presents with   Chest Pain   Edema    ble     HPI  Bradley Cummings  is a 33 y.o. male, with past medical history of alcohol abuse, tobacco abuse, Patient was brought by his father due to concern of his heavy drinking, patient was in ED November 2024, due to concerns of CHF, with workup significant for volume overload, edema, elevated BNP, but he left AMA then, upon my evaluation patient is obtundent as he just received IV Ativan  for withdrawals, ED staff, father assist with the history, patient presents to ED secondary to complaints of intermittent chest tightness, shortness of breath, dyspnea on exertion, abdominal distention, significant lower extremity swelling over the last couple weeks, no cough, no fever, no abdominal pain or vomiting, per father patient drinks 2-3 beers per day, (but he is unsure as likely drink more as father at work most of the day). - In ED chest x-ray significant for volume overload, sodium significantly low at 114, bicarb at 19, creatinine elevated at 1.57, alk phos 128, AST 1620, ALT 694, total bili 4.9, platelet count within normal limit at 176, troponins normal 13> 10, most recent known sodium level was November 2024 at 138, [facility] hospitalist consulted to admit, upon evaluation ICU has been consulted, given his critical condition, PCCM will admit to ICU.   Review of Systems    Patient is currently encephalopathic, altered  unable to provide any history   Social History Social History   Tobacco Use   Smoking status: Every Day    Current packs/day: 0.25    Average packs/day: 0.3 packs/day for 3.0 years (0.8 ttl pk-yrs)    Types: Cigarettes   Smokeless tobacco: Never  Substance Use Topics   Alcohol use: Yes    Comment: 3-4 40oz beers a day    Family History Family History  Problem Relation Age of Onset   Mental illness Neg Hx     Prior to Admission medications   Medication Sig Start Date End Date Taking? Authorizing Provider  aspirin-acetaminophen -caffeine (EXCEDRIN MIGRAINE) 250-250-65 MG tablet Take 1 tablet by mouth every 6 (six) hours as needed for migraine.   Yes [provider]    Anti-infectives (From admission, onward)    None       Scheduled Meds:  folic acid   1 mg Oral Daily   multivitamin with minerals  1 tablet Oral Daily   nicotine   21 mg  Transdermal Daily   thiamine   100 mg Oral Daily   Or   thiamine   100 mg Intravenous Daily   Continuous Infusions:  thiamine  (VITAMIN B1) injection     PRN Meds:.albuterol, LORazepam  **OR** LORazepam   No Known Allergies  Physical Exam  Vitals  Blood pressure 113/83, pulse 92, temperature 98.1 F (36.7 C), temperature source Oral, resp. rate (!) 24, height 5\' 10"  (1.778 m), weight 72.6 kg, SpO2 100%.   1. General Frail, acutely ill-appearing young male, appears jaundiced, obtundent  2.  Patient is lethargic, open eyes briefly then go back to sleep, oriented x 1  3. No F.N deficits, ALL C.Nerves Intact, Strength 5/5 all 4 extremities, Sensation intact all 4 extremities, Plantars down going.  4. Ears and Eyes appear Normal, Conjunctivae clear, PERRLA. Moist Oral Mucosa.  5. Supple Neck, significantly distended JVD, No cervical lymphadenopathy appriciated, No Carotid Bruits.  6. Symmetrical Chest wall movement, tachypneic, with crackles at the bases  7. RRR, No Gallops, Rubs or Murmurs, No Parasternal Heave.  +3  edema  8. Positive Bowel Sounds, Abdomen Soft, No tenderness, No organomegaly appriciated,No rebound -guarding or rigidity.  9.  No Cyanosis, Normal Skin Turgor, No Skin Rash or Bruise.  10. Good muscle tone,  joints appear normal , no effusions, Normal ROM.   Data Review  CBC Recent Labs  Lab 10/15/23 1217  WBC 7.2  HGB 11.3*  HCT 32.5*  PLT 176  MCV 82.3  MCH 28.6  MCHC 34.8  RDW 19.4*   ------------------------------------------------------------------------------------------------------------------  Chemistries  Recent Labs  Lab 10/15/23 1217 10/15/23 1525  NA 114*  --   K 5.0  --   CL 80*  --   CO2 19*  --   GLUCOSE 107*  --   BUN 32*  --   CREATININE 1.57*  --   CALCIUM 8.6*  --   AST  --  1,620*  ALT  --  694*  ALKPHOS  --  128*  BILITOT  --  4.9*   ------------------------------------------------------------------------------------------------------------------ estimated creatinine clearance is 69.4 mL/min (A) (by C-G formula based on SCr of 1.57 mg/dL (H)). ------------------------------------------------------------------------------------------------------------------ No results for input(s): "TSH", "T4TOTAL", "T3FREE", "THYROIDAB" in the last 72 hours.  Invalid input(s): "FREET3"   Coagulation profile No results for input(s): "INR", "PROTIME" in the last 168 hours. ------------------------------------------------------------------------------------------------------------------- No results for input(s): "DDIMER" in the last 72 hours. -------------------------------------------------------------------------------------------------------------------  Cardiac Enzymes No results for input(s): "CKMB", "TROPONINI", "MYOGLOBIN" in the last 168 hours.  Invalid input(s): "CK" ------------------------------------------------------------------------------------------------------------------ Invalid input(s):  "POCBNP"   ---------------------------------------------------------------------------------------------------------------  Urinalysis    Component Value Date/Time   COLORURINE YELLOW 04/02/2016 1914   APPEARANCEUR CLEAR 04/02/2016 1914   LABSPEC 1.028 04/02/2016 1914   PHURINE 7.5 04/02/2016 1914   GLUCOSEU NEGATIVE 04/02/2016 1914   HGBUR NEGATIVE 04/02/2016 1914   BILIRUBINUR NEGATIVE 04/02/2016 1914   KETONESUR NEGATIVE 04/02/2016 1914   PROTEINUR NEGATIVE 04/02/2016 1914   UROBILINOGEN 0.2 09/09/2011 1207   NITRITE NEGATIVE 04/02/2016 1914   LEUKOCYTESUR NEGATIVE 04/02/2016 1914     Imaging results:   DG Chest 2 View Result Date: 10/15/2023 CLINICAL DATA:  Chest pain.  Bilateral leg swelling. EXAM: CHEST - 2 VIEW COMPARISON:  Chest radiographs 04/10/2023 FINDINGS: Cardiac silhouette is moderately to markedly enlarged, appearing unchanged given current AP technique and PA technique on most recent 04/10/2023 radiographs. Mediastinal contours are within limits. The lungs are clear. No pulmonary edema. No pleural effusion or pneumothorax. No acute skeletal abnormality. IMPRESSION: Moderate to marked enlargement of  the cardiac silhouette, unchanged. No pulmonary edema. Electronically Signed   By: Bertina Broccoli M.D.   On: 10/15/2023 13:30    Vent. rate 104 BPM PR interval 190 ms QRS duration 102 ms QT/QTcB 338/444 ms P-R-T axes 71 -44 71 Sinus tachycardia Right atrial enlargement Indeterminate axis Cannot rule out Anterior infarct , age undetermined No significant change since last tracing No previous ECGs available    Assessment & Plan  Principal Problem:   Hyponatremia Active Problems:   Alcohol use disorder, moderate, dependence (HCC)   AMS (altered mental status)   Acute metabolic encephalopathy - Upon my evaluation patient is significantly altered, this is very likely in the setting of IV Ativan  he received earlier. - Will check CT head. - Check ammonia  level. - Check urine drug screen. - Will check ABG  Severe hyponatremia - Most recent value 56-month ago at 136, this is likely multifactorial in the setting of CHF, liver failure and beer potomania. - Avoid rapid correction, will monitor sodium every 4 hours. - Received IV Lasix  for now,. - Check urine sodium, urine osmolality  Transaminitis Hyperbilirubinemia Acute decompensated liver failure - Most likely in the setting of alcohol abuse, will check right upper quadrant ultrasound, will check CT abdomen pelvis without contrast. - Check ammonia level. - Avoid hepatotoxic medications. - Check INR. - GI consulted to evaluate if any indication for steroids for alcoholic hepatitis, but will need to await for his INR for [facility] discriminant function and MELD 3 score.   Acute CHF - Evidence of volume overload, on physical exam, and chest x-ray - Check daily weights, strict ins and out - Check 2D echo. - Received 40 mg of IV Lasix   Alcohol abuse -Will obtain urine drug screen, started on CIWA protocol, will give 1 dose of high-dose IV thiamine   AKI - Had some mild retention 400 cc bladder scan, but he ended urinating the whole volume spontaneously, monitor closely for retention. - Avoid nephrotoxic medication. - Cardiorenal?  Hepatorenal?  Chest pain -EKG nonacute, troponins are negative, appears than non typical, most likely laded to dyspnea and CHF  Patient is acutely ill, with critical lab abnormalities including severe hyponatremia, significant encephalopathy, ICU consulted, PCCM input greatly appreciated, given his critical condition he will be to ICU team    DVT Prophylaxis  SCDs   Family Communication: Plan discussed with father at bedside   Thank you for the consult, we will follow the patient with you in the Hospital.   Seena Dadds M.D on 10/15/2023 at 5:02 PM      Thank you for the consult, we will follow the patient with you in the Hospital.   [facility]   Office  [phone]

## 2023-10-15 NOTE — Consult Note (Signed)
 Inpatient Consultation   Referring Provider:      Primary Care Physician:  Patient, No Pcp Per Primary Gastroenterologist:        None Reason for Consultation:     Elevated liver enzymes, concern for alcoholic hepatitis         HPI  Bradley Cummings is a 33 y.o. male with history of alcohol dependence, evidence of CHF on bedside echo EF 20-25% admitted with fatigue, bilateral lower extremity edema, abdominal distention, hyponatremia (Na 114), AKI, elevated LFTs concerning for possible alcoholic hepatitis.  Majority of history was obtained from the patient's father as the patient was somewhat sleepy during interview.  Reported to have received a benzodiazepine in the ER for concern of alcohol withdrawal.  His father reports that the patient began developing bilateral lower extremity edema in fall 2024 but it acutely worsened over the last 1 to 2 weeks.  In conjunction with this, he developed abdominal distention over the last week as well as shortness of breath.  Noted to have scleral icterus.  Endorses chills at home but no fevers.  Denies abdominal pain, nausea, vomiting, dark urine, melena, hematochezia, easy bruising.   Father states that patient consumes alcohol on a daily basis-quantity not entirely known.  Patient tells me that he last consumed alcohol yesterday.  No known family history of liver disease  Laboratory studies in the ER noteworthy for sodium 114, creatinine 1.57, alk phos 128, AST 1620, ALT 694, total bilirubin 4.9 (DB 2.0, IB 2.9)  Chest x-ray with moderate to marked enlargement of the cardiac silhouette without pulmonary edema  Bedside echo reported to show cardiomyopathy with EF 20 to 25%  Past Medical History:  Diagnosis Date   Eczema     No past surgical history on file.  Family History  Problem Relation Age of Onset   Mental illness Neg Hx      Social History   Tobacco Use   Smoking status: Every Day    Current packs/day: 0.25    Average packs/day:  0.3 packs/day for 3.0 years (0.8 ttl pk-yrs)    Types: Cigarettes   Smokeless tobacco: Never  Vaping Use   Vaping status: Some Days  Substance Use Topics   Alcohol use: Yes    Comment: 3-4 40oz beers a day   Drug use: No    Prior to Admission medications   Medication Sig Start Date End Date Taking? Authorizing Provider  aspirin-acetaminophen -caffeine (EXCEDRIN MIGRAINE) 250-250-65 MG tablet Take 1 tablet by mouth every 6 (six) hours as needed for migraine.   Yes [provider]    Current Facility-Administered Medications  Medication Dose Route Frequency Provider Last Rate Last Admin   albuterol (PROVENTIL) (2.5 MG/3ML) 0.083% nebulizer solution 2.5 mg  2.5 mg Nebulization Q2H PRN Elgergawy, Dawood S, MD       docusate sodium  (COLACE) capsule 100 mg  100 mg Oral BID PRN Dione Franks, Rahul P, PA-C       folic acid  (FOLVITE ) tablet 1 mg  1 mg Oral Daily Elgergawy, Dawood S, MD   1 mg at 10/15/23 1543   furosemide  (LASIX ) injection 40 mg  40 mg Intravenous BID Desai, Rahul P, PA-C       multivitamin with minerals tablet 1 tablet  1 tablet Oral Daily Elgergawy, Dawood S, MD   1 tablet at 10/15/23 1543   nicotine  (NICODERM CQ  - dosed in mg/24 hours) patch 21 mg  21 mg Transdermal Daily Elgergawy, Dawood S, MD  polyethylene glycol (MIRALAX / GLYCOLAX) packet 17 g  17 g Oral Daily PRN Desai, Rahul P, PA-C       sodium chloride  3% (hypertonic) IV bolus 100 mL  100 mL Intravenous Once Desai, Rahul P, PA-C       thiamine  (VITAMIN B1) 500 mg in sodium chloride  0.9 % 50 mL IVPB  500 mg Intravenous Once Elgergawy, Dawood S, MD       thiamine  (VITAMIN B1) tablet 100 mg  100 mg Oral Daily Elgergawy, Dawood S, MD   100 mg at 10/15/23 1543   Or   thiamine  (VITAMIN B1) injection 100 mg  100 mg Intravenous Daily Elgergawy, Dawood S, MD       Current Outpatient Medications  Medication Sig Dispense Refill   aspirin-acetaminophen -caffeine (EXCEDRIN MIGRAINE) 250-250-65 MG tablet Take 1 tablet by  mouth every 6 (six) hours as needed for migraine.      Allergies as of 10/15/2023   (No Known Allergies)    GI Review of Symptoms Significant for abdominal distention. Otherwise negative.  General Review of Systems  Review of systems is significant for the pertinent positives and negatives as listed per the HPI.  Full ROS is otherwise negative.    Physical Exam  Vital signs in last 24 hours: Temp:  [98.1 F (36.7 C)-98.3 F (36.8 C)] 98.1 F (36.7 C) (05/16 1600) Pulse Rate:  [84-108] 94 (05/16 1730) Resp:  [15-27] 15 (05/16 1730) BP: (96-113)/(69-95) 109/93 (05/16 1730) SpO2:  [98 %-100 %] 100 % (05/16 1730) Weight:  [72.6 kg] 72.6 kg (05/16 1212)   General: Somewhat sleepy but answers yes/no questions Head:  Normocephalic and atraumatic. Eyes:   Scleral icterus present Lungs: Respirations even and unlabored. Lungs clear to auscultation bilaterally.   Decreased breath sounds at bases bilaterally Heart: Normal S1, S2. No MRG. Regular rate and rhythm.  Abdomen: Soft, distended, normoactive bowel sounds Rectal:  Deferred  Msk:  Symmetrical without gross deformities. Peripheral pulses intact.  Extremities: 3+ edema in lower extremities bilaterally Neurologic: Somewhat sleepy but answers questions in short phrases -appears distracted Skin:   Dry and intact without significant lesions or rashes.    Lab Results Recent Labs    10/15/23 1217 10/15/23 1722  WBC 7.2  --   HGB 11.3* 12.9*  HCT 32.5* 38.0*  PLT 176  --    BMET Recent Labs    10/15/23 1217 10/15/23 1722  NA 114* 114*  K 5.0 5.0  CL 80*  --   CO2 19*  --   GLUCOSE 107*  --   BUN 32*  --   CREATININE 1.57*  --   CALCIUM 8.6*  --    LFT Recent Labs    10/15/23 1525  PROT 7.5  ALBUMIN 3.3*  AST 1,620*  ALT 694*  ALKPHOS 128*  BILITOT 4.9*  BILIDIR 2.0*  IBILI 2.9*   PT/INR No results for input(s): "LABPROT", "INR" in the last 72 hours.  Radiographic Studies US  EKG SITE RITE Result  Date: 10/15/2023 If Site Rite image not attached, placement could not be confirmed due to current cardiac rhythm.  DG Chest 2 View Result Date: 10/15/2023 CLINICAL DATA:  Chest pain.  Bilateral leg swelling. EXAM: CHEST - 2 VIEW COMPARISON:  Chest radiographs 04/10/2023 FINDINGS: Cardiac silhouette is moderately to markedly enlarged, appearing unchanged given current AP technique and PA technique on most recent 04/10/2023 radiographs. Mediastinal contours are within limits. The lungs are clear. No pulmonary edema. No pleural effusion or pneumothorax. No  acute skeletal abnormality. IMPRESSION: Moderate to marked enlargement of the cardiac silhouette, unchanged. No pulmonary edema. Electronically Signed   By: Bertina Broccoli M.D.   On: 10/15/2023 13:30    Endoscopic Studies     None   Clinical Impression   It is my clinical impression that Mr. 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 Osburn has a history of alcohol dependence and presents with with fatigue, bilateral lower extremity edema, abdominal distention, hyponatremia (Na 114), AKI, evidence of cardiomyopathy on bedside echo and elevated LFTs concerning for possible alcoholic hepatitis.  Elevated liver enzymes may be reflective of alcoholic hepatitis but could also represent hepatopathy from congestive heart failure.  He is known to have a component of alcohol dependence but it would be prudent to consider ruling out other etiologies of chronic liver disease as well.  His physical exam findings are concerning for decompensated liver disease.  In the setting of his mental status changes recommend ruling out infection contributing to his clinical picture  Recommendations  Labs: INR, PT, ANA, anti-smooth muscle antibody, IgG level, acute hepatitis panel, ammonia  level Follow trend in daily hepatic function panel in conjunction with electrolytes Follow-up results of head CT, CTAP, abdominal ultrasound If ascites is present on an abdominal ultrasound recommend performing diagnostic paracentesis to evaluate for SBP Once INR is available will calculate Madrey score and monitor to determine if there is a future need for steroids Sodium correction per ICU team; monitor renal function 7.   Monitor for signs/symptoms of alcohol withdrawal.  Thank you for your kind consultation, we will continue to follow.  Scarlette Currier Jery Hollern  10/15/2023, 5:45 PM  Eugenia Hess, MD [facility]

## 2023-10-15 NOTE — ED Notes (Signed)
 Patient is being discharged from the Urgent Care and sent to the Emergency Department via POV with friend. Per Lavonia Powers, PA, patient is in need of higher level of care due to possible heart failure. Patient is aware and verbalizes understanding of plan of care.  Vitals:   10/15/23 1046 10/15/23 1102  BP:  96/70  Pulse: 84   Resp: 16   Temp: 98.2 F (36.8 C)   SpO2: 98%

## 2023-10-15 NOTE — ED Triage Notes (Signed)
 Pt states bilateral lower leg swelling for one week.  States sometimes when he walks he gets dizzy.

## 2023-10-15 NOTE — Discharge Instructions (Signed)
 Sent to ED for further management of like CHF

## 2023-10-15 NOTE — Progress Notes (Signed)
 eLink Physician-Brief Progress Note Patient Name: Bradley Cummings DOB: 01-07-1991 MRN: [id number]   Date of Service  10/15/2023  HPI/Events of Note  Serum sodium 116.  eICU Interventions          Marlynn Singer 10/15/2023, 9:22 PM

## 2023-10-15 NOTE — ED Provider Triage Note (Signed)
 Emergency Medicine Provider Triage Evaluation Note  Bradley Cummings , a 33 y.o. male  was evaluated in triage.  Pt complains of lower extremity swelling  Pt complains of chest and lower abdominal pain   Review of Systems  Positive: swellin Negative: fever  Physical Exam  BP 101/80 (BP Location: Right Arm)   Pulse (!) 108   Temp 98.3 F (36.8 C)   Resp (!) 23   Ht 5\' 10"  (1.778 m)   Wt 72.6 kg   SpO2 100%   BMI 22.97 kg/m  Gen:   Awake, no distress   Resp:  Normal effort  MSK:   Moves extremities without difficulty  Other:    Medical Decision Making  Medically screening exam initiated at 12:52 PM.  Appropriate orders placed.  Bradley Cummings was informed that the remainder of the evaluation will be completed by another provider, this initial triage assessment does not replace that evaluation, and the importance of remaining in the ED until their evaluation is complete.     Sandi Crosby, PA-C 10/15/23 1255

## 2023-10-15 NOTE — Progress Notes (Signed)
 Peripherally Inserted Central Catheter Placement  The IV Nurse has discussed with the patient and/or persons authorized to consent for the patient, the purpose of this procedure and the potential benefits and risks involved with this procedure.  The benefits include less needle sticks, lab draws from the catheter, and the patient may be discharged home with the catheter. Risks include, but not limited to, infection, bleeding, blood clot (thrombus formation), and puncture of an artery; nerve damage and irregular heartbeat and possibility to perform a PICC exchange if needed/ordered by physician.  Alternatives to this procedure were also discussed.  Bard Power PICC patient education guide, fact sheet on infection prevention and patient information card has been provided to patient /or left at bedside.    PICC Placement Documentation  PICC Triple Lumen 10/15/23 Right Basilic 35 cm 2 cm (Active)  Indication for Insertion or Continuance of Line Vasoactive infusions 10/15/23 1856  Exposed Catheter (cm) 2 cm 10/15/23 1856  Site Assessment Clean, Dry, Intact 10/15/23 1856  Lumen #1 Status Flushed;Saline locked;Blood return noted 10/15/23 1856  Lumen #2 Status Flushed;Saline locked;Blood return noted 10/15/23 1856  Lumen #3 Status Flushed;Saline locked;Blood return noted 10/15/23 1856  Dressing Type Transparent;Securing device;Other (Comment) 10/15/23 1856  Dressing Status Antimicrobial disc/dressing in place;Clean, Dry, Intact;Other (Comment) 10/15/23 1856  Line Care Connections checked and tightened 10/15/23 1856  Line Adjustment (NICU/IV Team Only) No 10/15/23 1856  Dressing Intervention New dressing;Adhesive placed at insertion site (IV team only) 10/15/23 1856  Dressing Change Due 10/22/23 10/15/23 1856   Father signed consent at bedside     Unk Garb 10/15/2023, 6:57 PM
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
## 2023-10-15 NOTE — Plan of Care (Signed)

## 2023-10-15 NOTE — ED Provider Notes (Signed)
 [facility]    CSN: [id number] Arrival date & time: 10/15/23  1014      History   Chief Complaint Chief Complaint  Patient presents with   Leg Swelling    HPI Bradley Cummings is a 33 y.o. male.   Patient here c/w b/l leg swelling 1 - 2 weeks.  Chart review notes history going back to 04/2023.  He went to ED, BNP was 2800 at that time, and patient left AMA.  There was concern for alcohol induced cardiomyopathy.  Patient reports chest tightness, orthopnea, DOE, dizziness.      Past Medical History:  Diagnosis Date   Eczema     Patient Active Problem List   Diagnosis Date Noted   Left foot pain 04/05/2018   Closed left scapular fracture 03/19/2018   Left rib fracture 03/19/2018   Headache due to trauma 03/19/2018   Traumatic subdural hematoma (HCC) 03/18/2018   SAH (subarachnoid hemorrhage) (HCC) 03/13/2018   MDD (major depressive disorder), single episode, severe with psychosis (HCC) 04/03/2016   Alcohol use disorder, moderate, dependence (HCC) 04/03/2016   Cannabis use disorder, mild, abuse 04/03/2016    History reviewed. No pertinent surgical history.     Home Medications    Prior to Admission medications   Medication Sig Start Date End Date Taking? Authorizing Provider  Acetaminophen  (TYLENOL  PO) Take 3 tablets by mouth daily as needed (pain).    [provider]    Family History Family History  Problem Relation Age of Onset   Mental illness Neg Hx     Social History Social History   Tobacco Use   Smoking status: Every Day    Current packs/day: 0.25    Average packs/day: 0.3 packs/day for 3.0 years (0.8 ttl pk-yrs)    Types: Cigarettes   Smokeless tobacco: Never  Vaping Use   Vaping status: Some Days  Substance Use Topics   Alcohol use: Yes    Comment: 3-4 40oz beers a day   Drug use: No     Allergies   Patient has no known allergies.   Review of Systems Review of Systems  Constitutional:  Positive for fatigue.  Negative for fever.  Respiratory:  Positive for chest tightness and shortness of breath. Negative for wheezing.   Cardiovascular:  Positive for chest pain and leg swelling. Negative for palpitations.  Gastrointestinal:  Negative for abdominal pain, constipation, nausea and vomiting.  Musculoskeletal:  Positive for myalgias. Negative for arthralgias.  Skin:  Negative for color change and wound.  Hematological:  Negative for adenopathy. Does not bruise/bleed easily.  Psychiatric/Behavioral:  Negative for sleep disturbance.      Physical Exam Triage Vital Signs ED Triage Vitals  Encounter Vitals Group     BP 10/15/23 1102 96/70     Systolic BP Percentile --      Diastolic BP Percentile --      Pulse Rate 10/15/23 1046 84     Resp 10/15/23 1046 16     Temp 10/15/23 1046 98.2 F (36.8 C)     Temp Source 10/15/23 1046 Oral     SpO2 10/15/23 1046 98 %     Weight --      Height --      Head Circumference --      Peak Flow --      Pain Score 10/15/23 1045 4     Pain Loc --      Pain Education --      Exclude from [employer]  Chart --    No data found.  Updated Vital Signs BP 96/70 (BP Location: Left Arm)   Pulse 84   Temp 98.2 F (36.8 C) (Oral)   Resp 16   SpO2 98%   Visual Acuity Right Eye Distance:   Left Eye Distance:   Bilateral Distance:    Right Eye Near:   Left Eye Near:    Bilateral Near:     Physical Exam Vitals and nursing note reviewed.  Constitutional:      General: He is not in acute distress.    Appearance: Normal appearance. He is not ill-appearing.  HENT:     Head: Normocephalic and atraumatic.  Eyes:     General: No scleral icterus.    Extraocular Movements: Extraocular movements intact.     Conjunctiva/sclera: Conjunctivae normal.  Cardiovascular:     Rate and Rhythm: Normal rate and regular rhythm.     Heart sounds: No murmur heard. Pulmonary:     Effort: Pulmonary effort is normal. No respiratory distress.     Breath sounds: No wheezing,  rhonchi or rales.  Musculoskeletal:        General: Normal range of motion.     Cervical back: Normal range of motion. No rigidity.     Right lower leg: No tenderness. 3+ Pitting Edema present.     Left lower leg: No tenderness. 3+ Pitting Edema present.  Skin:    General: Skin is warm.     Coloration: Skin is not jaundiced.     Findings: No rash.  Neurological:     General: No focal deficit present.     Mental Status: He is alert and oriented to person, place, and time.     Motor: No weakness.     Gait: Gait normal.  Psychiatric:        Mood and Affect: Mood normal.        Behavior: Behavior normal.      UC Treatments / Results  Labs (all labs ordered are listed, but only abnormal results are displayed) Labs Reviewed - No data to display  EKG   Radiology No results found.  Procedures Procedures (including critical care time)  Medications Ordered in UC Medications - No data to display  Initial Impression / Assessment and Plan / UC Course  I have reviewed the triage vital signs and the nursing notes.  Pertinent labs & imaging results that were available during my care of the patient were reviewed by me and considered in my medical decision making (see chart for details).     Sent to ED for further evaluation and management Final Clinical Impressions(s) / UC Diagnoses   Final diagnoses:  Bilateral lower extremity edema  Alcohol dependence with unspecified alcohol-induced disorder [facility]   Discharge Instructions      Sent to ED for further management of like CHF  ED Prescriptions   None    PDMP not reviewed this encounter.   Lavonia Powers, PA-C 10/15/23 1118

## 2023-10-15 NOTE — ED Triage Notes (Signed)
 Pt has swelling to BLE. Swelling has been for a week. Pt denies cardiac history. Also c/o central chest tightness. CP starts in lower abdomen and radiates to cental chest. Pt also c/o sob with exertion.

## 2023-10-15 NOTE — ED Provider Notes (Signed)
 [place] EMERGENCY DEPARTMENT AT [facility] Provider Note   CSN: [id number] Arrival date & time: 10/15/23  1132     History  Chief Complaint  Patient presents with   Chest Pain   Edema    ble    Bradley Cummings is a 33 y.o. male.  Patient is a 33 year old male with history of chronic alcohol abuse, prior emergency room visit in November of last year which showed concerns for CHF but patient left AMA who presented to urgent care today and was referred to the emergency room due to complaints of 1 to 2 weeks of lower extremity swelling, distention of his abdomen, orthopnea and dyspnea on exertion.  He has been having intermittent chest tightness as well.  He denies cough, fever, abdominal pain or vomiting.  Bowel movements have been normal.  Patient never followed up with outpatient cardiology or PCP after his visit in November.  He does continue to drink alcohol and reports 1-2 beers per day.  Unclear if patient has withdrawal symptoms if he does not use alcohol.  Is reporting he needs something for anxiety.  The history is provided by the patient and medical records.  Chest Pain      Home Medications Prior to Admission medications   Medication Sig Start Date End Date Taking? Authorizing Provider  Acetaminophen  (TYLENOL  PO) Take 3 tablets by mouth daily as needed (pain).    [provider]      Allergies    Patient has no known allergies.    Review of Systems   Review of Systems  Cardiovascular:  Positive for chest pain.    Physical Exam Updated Vital Signs BP (!) 113/95   Pulse 98   Temp 98.3 F (36.8 C)   Resp (!) 25   Ht 5\' 10"  (1.778 m)   Wt 72.6 kg   SpO2 100%   BMI 22.97 kg/m  Physical Exam Vitals and nursing note reviewed.  Constitutional:      General: He is not in acute distress.    Appearance: He is well-developed.  HENT:     Head: Normocephalic and atraumatic.  Eyes:     General: Scleral icterus present.     Conjunctiva/sclera:  Conjunctivae normal.     Pupils: Pupils are equal, round, and reactive to light.  Cardiovascular:     Rate and Rhythm: Regular rhythm. Tachycardia present.     Heart sounds: No murmur heard. Pulmonary:     Effort: Pulmonary effort is normal. No respiratory distress.     Breath sounds: Rales present. No wheezing.     Comments: Rales three quarters of the way up to the lung bilaterally Abdominal:     General: There is distension.     Palpations: Abdomen is soft.     Tenderness: There is no abdominal tenderness. There is no guarding or rebound.  Musculoskeletal:        General: No tenderness. Normal range of motion.     Cervical back: Normal range of motion and neck supple.     Right lower leg: Edema present.     Left lower leg: Edema present.     Comments: Greater than 3+ pitting edema to the knee and minimal edema above the knee bilaterally  Skin:    General: Skin is warm and dry.     Findings: No erythema or rash.  Neurological:     Mental Status: He is alert and oriented to person, place, and time. Mental status is at  baseline.  Psychiatric:        Mood and Affect: Mood normal.        Behavior: Behavior normal.     ED Results / Procedures / Treatments   Labs (all labs ordered are listed, but only abnormal results are displayed) Labs Reviewed  BASIC METABOLIC PANEL WITH GFR - Abnormal; Notable for the following components:      Result Value   Sodium 114 (*)    Chloride 80 (*)    CO2 19 (*)    Glucose, Bld 107 (*)    BUN 32 (*)    Creatinine, Ser 1.57 (*)    Calcium 8.6 (*)    GFR, Estimated 60 (*)    All other components within normal limits  CBC - Abnormal; Notable for the following components:   RBC 3.95 (*)    Hemoglobin 11.3 (*)    HCT 32.5 (*)    RDW 19.4 (*)    nRBC 2.4 (*)    All other components within normal limits  BRAIN NATRIURETIC PEPTIDE  HEPATIC FUNCTION PANEL  TROPONIN I (HIGH SENSITIVITY)  TROPONIN I (HIGH SENSITIVITY)    EKG EKG  Interpretation Date/Time:  Friday Oct 15 2023 12:36:22 EDT Ventricular Rate:  104 PR Interval:  190 QRS Duration:  102 QT Interval:  338 QTC Calculation: 444 R Axis:   -44  Text Interpretation: Sinus tachycardia Right atrial enlargement Indeterminate axis Cannot rule out Anterior infarct , age undetermined No significant change since last tracing No previous ECGs available Confirmed by Almond Army ([id number]) on 10/15/2023 3:26:28 PM  Radiology DG Chest 2 View Result Date: 10/15/2023 CLINICAL DATA:  Chest pain.  Bilateral leg swelling. EXAM: CHEST - 2 VIEW COMPARISON:  Chest radiographs 04/10/2023 FINDINGS: Cardiac silhouette is moderately to markedly enlarged, appearing unchanged given current AP technique and PA technique on most recent 04/10/2023 radiographs. Mediastinal contours are within limits. The lungs are clear. No pulmonary edema. No pleural effusion or pneumothorax. No acute skeletal abnormality. IMPRESSION: Moderate to marked enlargement of the cardiac silhouette, unchanged. No pulmonary edema. Electronically Signed   By: Bertina Broccoli M.D.   On: 10/15/2023 13:30    Procedures Procedures    Medications Ordered in ED Medications  furosemide  (LASIX ) injection 40 mg (has no administration in time range)  LORazepam  (ATIVAN ) injection 1 mg (has no administration in time range)  LORazepam  (ATIVAN ) tablet 1-4 mg (has no administration in time range)    Or  LORazepam  (ATIVAN ) injection 1-4 mg (has no administration in time range)  thiamine  (VITAMIN B1) tablet 100 mg (has no administration in time range)    Or  thiamine  (VITAMIN B1) injection 100 mg (has no administration in time range)  folic acid  (FOLVITE ) tablet 1 mg (has no administration in time range)  multivitamin with minerals tablet 1 tablet (has no administration in time range)    ED Course/ Medical Decision Making/ A&P                                 Medical Decision Making Amount and/or Complexity of Data  Reviewed External Data Reviewed: notes. Labs: ordered. Decision-making details documented in ED Course. Radiology: ordered and independent interpretation performed. Decision-making details documented in ED Course. ECG/medicine tests: ordered and independent interpretation performed. Decision-making details documented in ED Course.  Risk OTC drugs. Prescription drug management. Decision regarding hospitalization.   Pt with multiple medical problems and comorbidities and presenting today  with a complaint that caries a high risk for morbidity and mortality.  Patient presenting with symptoms concerning for CHF with significant edema, orthopnea and dyspnea on exertion.  No current chest pain at this time.  Patient also uses alcohol daily and is not having symptoms of withdrawal at this time but would be a high risk.  Unclear why at patient's age he is having CHF symptoms.  He has not followed up as an outpatient or had any further testing.  He has never had an echo concern for a cardiomyopathy.  Also concern for possible cirrhosis.  I independently interpreted patient's EKG and labs.  EKG with sinus tachycardia with no acute changes, BMP today with hyponatremia with a sodium of 114 with mild AKI with creatinine of 1.57 and a normal anion gap.  Hyponatremia may be related to CHF and chronic beer use.  CBC with stable hemoglobin of 11 and normal white count, initial troponin was normal at 13.  LFTs are pending and BNP.  Given patient's exam findings and abnormal labs will start diuresis with IV Lasix .  This was discussed with the patient at this time he is willing to stay for hospitalization.  Consult for unassigned medicine was placed for admission.  CRITICAL CARE Performed by: Peola Joynt Total critical care time: 30 minutes Critical care time was exclusive of separately billable procedures and treating other patients. Critical care was necessary to treat or prevent imminent or life-threatening  deterioration. Critical care was time spent personally by me on the following activities: development of treatment plan with patient and/or surrogate as well as nursing, discussions with consultants, evaluation of patient's response to treatment, examination of patient, obtaining history from patient or surrogate, ordering and performing treatments and interventions, ordering and review of laboratory studies, ordering and review of radiographic studies, pulse oximetry and re-evaluation of patient's condition.          Final Clinical Impression(s) / ED Diagnoses Final diagnoses:  Congestive heart failure, unspecified HF chronicity, unspecified heart failure type (HCC)  Hyponatremia  Alcohol abuse    Rx / DC Orders ED Discharge Orders     None         Almond Army, MD 10/15/23 1544

## 2023-10-15 NOTE — TOC CM/SW Note (Signed)
 SW added substance abuse resources to patients AVS.  .Winfield Hau, MSW, LCSWA Transition of Care  Clinical Social Worker (ED 3-11 Mon-Fri)  [phone]

## 2023-10-16 ENCOUNTER — Inpatient Hospital Stay (HOSPITAL_COMMUNITY)

## 2023-10-16 ENCOUNTER — Encounter (HOSPITAL_COMMUNITY): Payer: Self-pay | Admitting: Pulmonary Disease

## 2023-10-16 DIAGNOSIS — F1020 Alcohol dependence, uncomplicated: Secondary | ICD-10-CM | POA: Diagnosis not present

## 2023-10-16 DIAGNOSIS — I5023 Acute on chronic systolic (congestive) heart failure: Secondary | ICD-10-CM

## 2023-10-16 DIAGNOSIS — R748 Abnormal levels of other serum enzymes: Secondary | ICD-10-CM | POA: Diagnosis not present

## 2023-10-16 DIAGNOSIS — I5021 Acute systolic (congestive) heart failure: Secondary | ICD-10-CM | POA: Diagnosis not present

## 2023-10-16 DIAGNOSIS — E871 Hypo-osmolality and hyponatremia: Secondary | ICD-10-CM | POA: Diagnosis not present

## 2023-10-16 DIAGNOSIS — R7401 Elevation of levels of liver transaminase levels: Secondary | ICD-10-CM | POA: Diagnosis not present

## 2023-10-16 DIAGNOSIS — R079 Chest pain, unspecified: Secondary | ICD-10-CM | POA: Diagnosis not present

## 2023-10-16 DIAGNOSIS — F1010 Alcohol abuse, uncomplicated: Secondary | ICD-10-CM | POA: Diagnosis not present

## 2023-10-16 DIAGNOSIS — K7010 Alcoholic hepatitis without ascites: Secondary | ICD-10-CM | POA: Diagnosis not present

## 2023-10-16 DIAGNOSIS — G9341 Metabolic encephalopathy: Secondary | ICD-10-CM | POA: Diagnosis not present

## 2023-10-16 DIAGNOSIS — I426 Alcoholic cardiomyopathy: Secondary | ICD-10-CM | POA: Diagnosis not present

## 2023-10-16 LAB — HEPATIC FUNCTION PANEL
ALT: 605 U/L — ABNORMAL HIGH (ref 0–44)
AST: 1241 U/L — ABNORMAL HIGH (ref 15–41)
Albumin: 2.8 g/dL — ABNORMAL LOW (ref 3.5–5.0)
Alkaline Phosphatase: 106 U/L (ref 38–126)
Bilirubin, Direct: 2.1 mg/dL — ABNORMAL HIGH (ref 0.0–0.2)
Indirect Bilirubin: 2.6 mg/dL — ABNORMAL HIGH (ref 0.3–0.9)
Total Bilirubin: 4.7 mg/dL — ABNORMAL HIGH (ref 0.0–1.2)
Total Protein: 6.2 g/dL — ABNORMAL LOW (ref 6.5–8.1)

## 2023-10-16 LAB — PHOSPHORUS: Phosphorus: 3 mg/dL (ref 2.5–4.6)

## 2023-10-16 LAB — BASIC METABOLIC PANEL WITH GFR
Anion gap: 11 (ref 5–15)
Anion gap: 9 (ref 5–15)
BUN: 26 mg/dL — ABNORMAL HIGH (ref 6–20)
BUN: 18 mg/dL (ref 6–20)
CO2: 24 mmol/L (ref 22–32)
CO2: 28 mmol/L (ref 22–32)
Calcium: 8.2 mg/dL — ABNORMAL LOW (ref 8.9–10.3)
Calcium: 8.3 mg/dL — ABNORMAL LOW (ref 8.9–10.3)
Chloride: 89 mmol/L — ABNORMAL LOW (ref 98–111)
Chloride: 88 mmol/L — ABNORMAL LOW (ref 98–111)
Creatinine, Ser: 1.13 mg/dL (ref 0.61–1.24)
Creatinine, Ser: 1 mg/dL (ref 0.61–1.24)
GFR, Estimated: >60 mL/min (ref >60)
GFR, Estimated: >60 mL/min (ref >60)
Glucose, Bld: 99 mg/dL (ref 70–99)
Glucose, Bld: 228 mg/dL — ABNORMAL HIGH (ref 70–99)
Potassium: 3.6 mmol/L (ref 3.5–5.1)
Potassium: 3.5 mmol/L (ref 3.5–5.1)
Sodium: 124 mmol/L — ABNORMAL LOW (ref 135–145)
Sodium: 125 mmol/L — ABNORMAL LOW (ref 135–145)

## 2023-10-16 LAB — ECHOCARDIOGRAM COMPLETE
Area-P 1/2: 4.63 cm2
Calc EF: 19.1 %
Est EF: <20
Height: 70 in
S' Lateral: 6.6 cm
Single Plane A2C EF: 19.2 %
Single Plane A4C EF: 21.5 %
Weight: 2645.52 [oz_av]

## 2023-10-16 LAB — COOXEMETRY PANEL
Carboxyhemoglobin: 1.7 % — ABNORMAL HIGH (ref 0.5–1.5)
Carboxyhemoglobin: 1.2 % (ref 0.5–1.5)
Methemoglobin: 0.8 % (ref 0.0–1.5)
Methemoglobin: <0.7 % (ref 0.0–1.5)
O2 Saturation: 55.3 %
O2 Saturation: 56.3 %
Total hemoglobin: 10.9 g/dL — ABNORMAL LOW (ref 12.0–16.0)
Total hemoglobin: 11.4 g/dL — ABNORMAL LOW (ref 12.0–16.0)

## 2023-10-16 LAB — HEPATITIS PANEL, ACUTE
HCV Ab: NONREACTIVE
Hep A IgM: NONREACTIVE
Hep B C IgM: NONREACTIVE
Hepatitis B Surface Ag: NONREACTIVE

## 2023-10-16 LAB — PROTIME-INR
INR: 2.5 — ABNORMAL HIGH (ref 0.8–1.2)
INR: 2.7 — ABNORMAL HIGH (ref 0.8–1.2)
Prothrombin Time: 27.4 s — ABNORMAL HIGH (ref 11.4–15.2)
Prothrombin Time: 29 s — ABNORMAL HIGH (ref 11.4–15.2)

## 2023-10-16 LAB — CBC
HCT: 29.7 % — ABNORMAL LOW (ref 39.0–52.0)
Hemoglobin: 10.5 g/dL — ABNORMAL LOW (ref 13.0–17.0)
MCH: 29 pg (ref 26.0–34.0)
MCHC: 35.4 g/dL (ref 30.0–36.0)
MCV: 82 fL (ref 80.0–100.0)
Platelets: 156 10*3/uL (ref 150–400)
RBC: 3.62 MIL/uL — ABNORMAL LOW (ref 4.22–5.81)
RDW: 19.5 % — ABNORMAL HIGH (ref 11.5–15.5)
WBC: 4.9 10*3/uL (ref 4.0–10.5)
nRBC: 1 % — ABNORMAL HIGH (ref 0.0–0.2)

## 2023-10-16 LAB — HIV ANTIBODY (ROUTINE TESTING W REFLEX): HIV Screen 4th Generation wRfx: NONREACTIVE

## 2023-10-16 LAB — SODIUM
Sodium: 120 mmol/L — ABNORMAL LOW (ref 135–145)
Sodium: 122 mmol/L — ABNORMAL LOW (ref 135–145)
Sodium: 126 mmol/L — ABNORMAL LOW (ref 135–145)
Sodium: 126 mmol/L — ABNORMAL LOW (ref 135–145)

## 2023-10-16 LAB — OSMOLALITY: Osmolality: 267 mosm/kg — ABNORMAL LOW (ref 275–295)

## 2023-10-16 LAB — HEMOGLOBIN A1C
Hgb A1c MFr Bld: 5.8 % — ABNORMAL HIGH (ref 4.8–5.6)
Mean Plasma Glucose: 119.76 mg/dL

## 2023-10-16 LAB — GLUCOSE, CAPILLARY
Glucose-Capillary: 115 mg/dL — ABNORMAL HIGH (ref 70–99)
Glucose-Capillary: 127 mg/dL — ABNORMAL HIGH (ref 70–99)
Glucose-Capillary: 154 mg/dL — ABNORMAL HIGH (ref 70–99)
Glucose-Capillary: 156 mg/dL — ABNORMAL HIGH (ref 70–99)

## 2023-10-16 LAB — MAGNESIUM: Magnesium: 1.8 mg/dL (ref 1.7–2.4)

## 2023-10-16 MED ORDER — DEXTROSE 5 % IV SOLN: INTRAVENOUS | Status: AC

## 2023-10-16 MED ORDER — POTASSIUM CHLORIDE 10 MEQ/100ML IV SOLN
10.0000 meq | INTRAVENOUS | Status: DC
  Filled 2023-10-16 (×2): qty 100

## 2023-10-16 MED ORDER — POTASSIUM CHLORIDE CRYS ER 20 MEQ PO TBCR
40.0000 meq | EXTENDED_RELEASE_TABLET | ORAL | Status: AC
  Administered 2023-10-16 – 2023-10-17 (×2): 40 meq via ORAL
  Filled 2023-10-16: qty 2

## 2023-10-16 MED ORDER — LORAZEPAM 2 MG/ML IJ SOLN
1.0000 mg | INTRAMUSCULAR | Status: AC | PRN
  Administered 2023-10-16 – 2023-10-19 (×4): 2 mg via INTRAVENOUS
  Filled 2023-10-16 (×4): qty 1

## 2023-10-16 MED ORDER — MAGNESIUM SULFATE 2 GM/50ML IV SOLN
2.0000 g | Freq: Once | INTRAVENOUS | Status: AC
  Administered 2023-10-16: 2 g via INTRAVENOUS
  Filled 2023-10-16: qty 50

## 2023-10-16 MED ORDER — LORAZEPAM 1 MG PO TABS
1.0000 mg | ORAL_TABLET | ORAL | Status: AC | PRN
  Administered 2023-10-17 (×4): 1 mg via ORAL
  Administered 2023-10-18: 2 mg via ORAL
  Administered 2023-10-18: 1 mg via ORAL
  Administered 2023-10-18: 2 mg via ORAL
  Filled 2023-10-16: qty 1
  Filled 2023-10-16: qty 2
  Filled 2023-10-16 (×3): qty 1
  Filled 2023-10-16 (×2): qty 2
  Filled 2023-10-16 (×2): qty 1

## 2023-10-16 MED ORDER — POTASSIUM CHLORIDE 10 MEQ/50ML IV SOLN
10.0000 meq | INTRAVENOUS | Status: AC
  Administered 2023-10-16 (×2): 10 meq via INTRAVENOUS
  Filled 2023-10-16 (×2): qty 50

## 2023-10-16 MED ORDER — INSULIN ASPART 100 UNIT/ML IJ SOLN
0.0000 [IU] | INTRAMUSCULAR | Status: DC
  Administered 2023-10-16: 2 [IU] via SUBCUTANEOUS
  Administered 2023-10-16: 1 [IU] via SUBCUTANEOUS
  Administered 2023-10-16: 2 [IU] via SUBCUTANEOUS

## 2023-10-16 MED ORDER — PREDNISOLONE 5 MG PO TABS
40.0000 mg | ORAL_TABLET | Freq: Every day | ORAL | Status: DC
  Administered 2023-10-16 – 2023-10-23 (×8): 40 mg via ORAL
  Filled 2023-10-16 (×8): qty 8

## 2023-10-16 MED ORDER — PERFLUTREN LIPID MICROSPHERE
1.0000 mL | INTRAVENOUS | Status: AC | PRN
  Administered 2023-10-16: 3 mL via INTRAVENOUS

## 2023-10-16 MED ORDER — ARTIFICIAL TEARS OPHTHALMIC OINT
TOPICAL_OINTMENT | OPHTHALMIC | Status: DC | PRN
  Filled 2023-10-16: qty 3.5

## 2023-10-16 MED ORDER — POTASSIUM CHLORIDE CRYS ER 20 MEQ PO TBCR
40.0000 meq | EXTENDED_RELEASE_TABLET | Freq: Once | ORAL | Status: AC
  Administered 2023-10-16: 40 meq via ORAL
  Filled 2023-10-16: qty 2

## 2023-10-16 NOTE — Progress Notes (Signed)
 Inpatient Progress Note     Patient Profile/Chief Complaint  32 y.o. male with history of alcohol dependence, evidence of CHF on bedside echo EF 20-25% admitted with fatigue, bilateral lower extremity edema, abdominal distention, hyponatremia (Na 114), AKI, elevated LFTs concerning for possible alcoholic hepatitis.    Interval History   -- Clinically feeling better today -- Slight downtrend in LFTs: AST 1241, ALT 605, alk phos 106, total bili 4.7, DB 2.1, IB 2.6; PT 27.4, INR 2.5 -- Sodium level has slowly been corrected from 114-126 -- Radiology unable to do diagnostic paracentesis due to location of ascites around liver    Objective   Vital signs in last 24 hours: Temp:  [97.3 F (36.3 C)-98.5 F (36.9 C)] 97.3 F (36.3 C) (05/17 1122) Pulse Rate:  [87-100] 97 (05/17 1400) Resp:  [15-29] 22 (05/17 1400) BP: (86-113)/(62-95) 95/75 (05/17 1400) SpO2:  [96 %-100 %] 99 % (05/17 1400) Weight:  [75 kg-77.1 kg] 75 kg (05/17 0434) Last BM Date : 10/15/23 General:    Alert, no distress Heart:  Regular rate and rhythm; no murmurs Lungs: Respirations even and unlabored, lungs CTA bilaterally Abdomen:  Soft, nontender, + distension,Normal bowel sounds. Extremities: 2+ edema in lower extremities bilaterally Neurologic: More alert than yesterday, answers questions Psych:  Cooperative. Normal mood and affect.  Intake/Output from previous day: 05/16 0701 - 05/17 0700 In: 100 [IV Piggyback:100] Out: 4100 [Urine:4100] Intake/Output this shift: Total I/O In: 863.7 [I.V.:750; IV Piggyback:113.7] Out: 1100 [Urine:1100]  Lab Results: Recent Labs    10/15/23 1217 10/15/23 1722 10/16/23 0450  WBC 7.2  --  4.9  HGB 11.3* 12.9* 10.5*  HCT 32.5* 38.0* 29.7*  PLT 176  --  156   BMET Recent Labs    10/15/23 1217 10/15/23 1722 10/15/23 2021 10/16/23 0450 10/16/23 0959 10/16/23 1238  NA 114* 114*   < > 124* 126* 126*  K 5.0 5.0  --  3.6  --   --   CL 80*  --   --  89*  --    --   CO2 19*  --   --  24  --   --   GLUCOSE 107*  --   --  99  --   --   BUN 32*  --   --  26*  --   --   CREATININE 1.57*  --   --  1.13  --   --   CALCIUM 8.6*  --   --  8.2*  --   --    < > = values in this interval not displayed.   LFT Recent Labs    10/16/23 0450  PROT 6.2*  ALBUMIN 2.8*  AST 1,241*  ALT 605*  ALKPHOS 106  BILITOT 4.7*  BILIDIR 2.1*  IBILI 2.6*   PT/INR Recent Labs    10/16/23 0000 10/16/23 0450  LABPROT 29.0* 27.4*  INR 2.7* 2.5*   Acute hepatitis panel negative  Studies/Results: ECHOCARDIOGRAM COMPLETE Result Date: 10/16/2023    ECHOCARDIOGRAM REPORT   Patient Name:   Bradley Cummings Date of Exam: 10/16/2023 Medical Rec #:  [id number]  Height:       70.0 in Accession #:    [id number] Weight:       165.3 lb Date of Birth:  Jul 28, 1990 BSA:          1.925 m Patient Age:    32 years   BP:           102/77  mmHg Patient Gender: M          HR:           90 bpm. Exam Location:  Inpatient Procedure: 2D Echo, Color Doppler, Cardiac Doppler and Intracardiac            Opacification Agent (Both Spectral and Color Flow Doppler were            utilized during procedure). Indications:    I50.21 Acute systolic (congestive) heart failure  History:        Patient has no prior history of Echocardiogram examinations.                 Risk Factors:ETOH.  Sonographer:    Sherline Distel Senior RDCS Referring Phys: [id number] DAWOOD S ELGERGAWY IMPRESSIONS  1. No LV thrombus by Definity. Left ventricular ejection fraction, by estimation, is <20%. The left ventricle has severely decreased function. The left ventricle demonstrates global hypokinesis. The left ventricular internal cavity size was severely dilated. Left ventricular diastolic parameters are consistent with Grade III diastolic dysfunction (restrictive).  2. Right ventricular systolic function is severely reduced. The right ventricular size is moderately enlarged. There is mildly elevated pulmonary artery systolic pressure. The estimated right  ventricular systolic pressure is 38.6 mmHg.  3. Left atrial size was severely dilated.  4. Right atrial size was severely dilated.  5. The mitral valve is abnormal. Moderate to severe mitral valve regurgitation. No evidence of mitral stenosis.  6. The tricuspid valve is abnormal. Tricuspid valve regurgitation is mild to moderate.  7. The aortic valve is tricuspid. Aortic valve regurgitation is mild. No aortic stenosis is present.  8. The pulmonic valve was abnormal. Pulmonic valve regurgitation is moderate.  9. Moderately dilated pulmonary artery. 10. The inferior vena cava is dilated in size with <50% respiratory variability, suggesting right atrial pressure of 15 mmHg. Comparison(s): No prior Echocardiogram. FINDINGS  Left Ventricle: No LV thrombus by Definity. Left ventricular ejection fraction, by estimation, is <20%. The left ventricle has severely decreased function. The left ventricle demonstrates global hypokinesis. Definity contrast agent was given IV to delineate the left ventricular endocardial borders. Strain was performed and the global longitudinal strain is indeterminate. The left ventricular internal cavity size was severely dilated. There is no left ventricular hypertrophy. Left ventricular diastolic parameters are consistent with Grade III diastolic dysfunction (restrictive). Right Ventricle: The right ventricular size is moderately enlarged. No increase in right ventricular wall thickness. Right ventricular systolic function is severely reduced. There is mildly elevated pulmonary artery systolic pressure. The tricuspid regurgitant velocity is 2.43 m/s, and with an assumed right atrial pressure of 15 mmHg, the estimated right ventricular systolic pressure is 38.6 mmHg. Left Atrium: Left atrial size was severely dilated. Right Atrium: Right atrial size was severely dilated. Pericardium: Trivial pericardial effusion is present. Mitral Valve: The mitral valve is abnormal. Moderate to severe mitral  valve regurgitation. No evidence of mitral valve stenosis. Tricuspid Valve: The tricuspid valve is abnormal. Tricuspid valve regurgitation is mild to moderate. No evidence of tricuspid stenosis. Aortic Valve: The aortic valve is tricuspid. Aortic valve regurgitation is mild. No aortic stenosis is present. Pulmonic Valve: The pulmonic valve was abnormal. Pulmonic valve regurgitation is moderate. No evidence of pulmonic stenosis. Aorta: The aortic root and ascending aorta are structurally normal, with no evidence of dilitation. Pulmonary Artery: The pulmonary artery is moderately dilated. Venous: The inferior vena cava is dilated in size with less than 50% respiratory variability, suggesting right atrial pressure of 15 mmHg.  IAS/Shunts: The atrial septum is grossly normal. Additional Comments: 3D was performed not requiring image post processing on an independent workstation and was indeterminate.  LEFT VENTRICLE PLAX 2D LVIDd:         7.70 cm      Diastology LVIDs:         6.60 cm      LV e' medial:    5.44 cm/s LV PW:         0.70 cm      LV E/e' medial:  20.8 LV IVS:        0.60 cm      LV e' lateral:   10.00 cm/s LVOT diam:     2.20 cm      LV E/e' lateral: 11.3 LV SV:         35 LV SV Index:   18 LVOT Area:     3.80 cm  LV Volumes (MOD) LV vol d, MOD A2C: 323.0 ml LV vol d, MOD A4C: 284.0 ml LV vol s, MOD A2C: 261.0 ml LV vol s, MOD A4C: 223.0 ml LV SV MOD A2C:     62.0 ml LV SV MOD A4C:     284.0 ml LV SV MOD BP:      59.0 ml RIGHT VENTRICLE RV S prime:     9.57 cm/s TAPSE (M-mode): 1.9 cm LEFT ATRIUM              Index        RIGHT ATRIUM           Index LA diam:        4.70 cm  2.44 cm/m   RA Area:     37.40 cm LA Vol (A2C):   122.0 ml 63.37 ml/m  RA Volume:   163.00 ml 84.67 ml/m LA Vol (A4C):   98.0 ml  50.90 ml/m LA Biplane Vol: 114.0 ml 59.22 ml/m  AORTIC VALVE LVOT Vmax:   55.10 cm/s LVOT Vmean:  43.700 cm/s LVOT VTI:    0.092 m  AORTA Ao Root diam: 2.90 cm Ao Asc diam:  3.30 cm MITRAL VALVE                 TRICUSPID VALVE MV Area (PHT): 4.63 cm     TR Peak grad:   23.6 mmHg MV Decel Time: 164 msec     TR Vmax:        243.00 cm/s MV E velocity: 113.00 cm/s                             SHUNTS                             Systemic VTI:  0.09 m                             Systemic Diam: 2.20 cm Vishnu Priya Mallipeddi Electronically signed by Lucetta Russel Mallipeddi Signature Date/Time: 10/16/2023/1:44:08 PM    Final    DG Chest Port 1 View Result Date: 10/16/2023 CLINICAL DATA:  33 year old male with respiratory failure. CHF, abdominal distension and extremity edema. EXAM: PORTABLE CHEST 1 VIEW COMPARISON:  Chest radiographs yesterday. FINDINGS: Portable AP semi upright view at 0620 hours. New right upper extremity approach PICC line, tip at the cavoatrial junction level. Stable cardiomegaly and mediastinal contours. Stable lung  volumes. Allowing for portable technique the lungs are clear. No pneumothorax or pleural effusion. Visualized tracheal air column is within normal limits. No osseous abnormality identified. Paucity of bowel gas. IMPRESSION: 1. New right upper extremity approach PICC line, tip at the cavoatrial junction level. 2. Stable cardiomegaly. No acute cardiopulmonary abnormality. Electronically Signed   By: Marlise Simpers M.D.   On: 10/16/2023 06:37   US  Abdomen Limited RUQ (LIVER/GB) Result Date: 10/15/2023 CLINICAL DATA:  Transaminitis EXAM: ULTRASOUND ABDOMEN LIMITED RIGHT UPPER QUADRANT COMPARISON:  None Available. FINDINGS: Gallbladder: Slight wall thickening at 3.5 mm. Layering sludge within the gallbladder. No stones or sonographic Murphy sign. Common bile duct: Diameter: Normal caliber, 5 mm. Liver: Increased echotexture compatible with fatty infiltration. No focal abnormality or biliary ductal dilatation. Portal vein is patent on color Doppler imaging with normal direction of blood flow towards the liver. Other: Moderate perihepatic ascites. IMPRESSION: The layering sludge within the  gallbladder. No cholelithiasis. Slight gallbladder wall thickening. Fatty liver. Perihepatic ascites. Electronically Signed   By: Janeece Mechanic M.D.   On: 10/15/2023 22:27   CT ABDOMEN PELVIS WO CONTRAST Result Date: 10/15/2023 CLINICAL DATA:  Abdomen pain liver failure EXAM: CT ABDOMEN AND PELVIS WITHOUT CONTRAST TECHNIQUE: Multidetector CT imaging of the abdomen and pelvis was performed following the standard protocol without IV contrast. RADIATION DOSE REDUCTION: This exam was performed according to the departmental dose-optimization program which includes automated exposure control, adjustment of the mA and/or kV according to patient size and/or use of iterative reconstruction technique. COMPARISON:  CT 03/13/2018 FINDINGS: Lower chest: Lung bases demonstrate cardiomegaly. No acute airspace disease or pleural effusion Hepatobiliary: No calcified gallstone. No focal hepatic abnormality without contrast. Hepatic steatosis. No biliary dilatation Pancreas: Unremarkable. No pancreatic ductal dilatation or surrounding inflammatory changes. Spleen: Normal in size without focal abnormality. Adrenals/Urinary Tract: Adrenal glands are normal. Kidneys show no hydronephrosis. The bladder is normal Stomach/Bowel: The stomach is nonenlarged. There is no dilated small bowel. No acute bowel wall thickening. Vascular/Lymphatic: No significant vascular findings are present. No enlarged abdominal or pelvic lymph nodes. Reproductive: Prostate is unremarkable. Other: No free air.  Small volume abdominopelvic ascites Musculoskeletal: No acute osseous abnormality. IMPRESSION: 1. Hepatic steatosis. Small volume abdominopelvic ascites. 2. Cardiomegaly. Electronically Signed   By: Esmeralda Hedge M.D.   On: 10/15/2023 18:20   CT HEAD WO CONTRAST ( ) Result Date: 10/15/2023 CLINICAL DATA:  Altered mental status EXAM: CT HEAD WITHOUT CONTRAST TECHNIQUE: Contiguous axial images were obtained from the base of the skull through the  vertex without intravenous contrast. RADIATION DOSE REDUCTION: This exam was performed according to the departmental dose-optimization program which includes automated exposure control, adjustment of the mA and/or kV according to patient size and/or use of iterative reconstruction technique. COMPARISON:  Head CT 11/17/2018 FINDINGS: Brain: No evidence of acute infarction, hemorrhage, hydrocephalus, extra-axial collection or mass lesion/mass effect. Vascular: No hyperdense vessel or unexpected calcification. Skull: Normal. Negative for fracture or focal lesion. Sinuses/Orbits: No acute finding. Other: None. IMPRESSION: No acute intracranial abnormality. Electronically Signed   By: Tyron Gallon M.D.   On: 10/15/2023 18:15   US  EKG SITE RITE Result Date: 10/15/2023 If Site Rite image not attached, placement could not be confirmed due to current cardiac rhythm.  DG Chest 2 View Result Date: 10/15/2023 CLINICAL DATA:  Chest pain.  Bilateral leg swelling. EXAM: CHEST - 2 VIEW COMPARISON:  Chest radiographs 04/10/2023 FINDINGS: Cardiac silhouette is moderately to markedly enlarged, appearing unchanged given current AP technique and PA technique  on most recent 04/10/2023 radiographs. Mediastinal contours are within limits. The lungs are clear. No pulmonary edema. No pleural effusion or pneumothorax. No acute skeletal abnormality. IMPRESSION: Moderate to marked enlargement of the cardiac silhouette, unchanged. No pulmonary edema. Electronically Signed   By: Bertina Broccoli M.D.   On: 10/15/2023 13:30    Endoscopic Studies: None   Clinical Impression   It is my clinical impression that Mr. 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 Bradley Cummings has a history of alcohol dependence and presents with with fatigue,  bilateral lower extremity edema, abdominal distention, hyponatremia (Na 114), AKI, evidence of cardiomyopathy on bedside echo and elevated LFTs concerning for possible alcoholic hepatitis.  Elevated liver enzymes may be reflective of alcoholic hepatitis but could also represent hepatopathy from congestive heart failure.  He is known to have a component of alcohol dependence but it would be prudent to consider ruling out other etiologies of chronic liver disease as well.  Acute hepatitis panel is negative.  Serologies pending for autoimmune hepatitis and Wilson's disease.  Maddrey discriminant score calculated today is 61 therefore meets criteria for initiation of cortiosteroids.  Laboratory and imaging studies have not disclosed any evidence of infection that would contraindicate corticosteroids.  Radiology was unable to perform a diagnostic paracentesis due to the location of ascites around the liver.    Plan  Agree with initiation of prednisolone 40 mg orally daily Recommend starting PPI-Protonix  40 mg orally daily in conjunction with initiation of prednisolone for gastric protection Follow trend and daily hepatic function panel in conjunction with kidney function and electrolytes Follow-up remainder of chronic hepatitides workup-autoimmune hepatitis serologies and ceruloplasmin Monitor for signs and symptoms of alcohol withdrawal Continue diuresis with Lasix  Agree with advancing diet; continuing nutritional support with multivitamin, thiamine  and folate    LOS: 1 day   Truddie Furrow  10/16/2023, 3:11 PM  Eugenia Hess, MD [facility]

## 2023-10-16 NOTE — Progress Notes (Signed)
 eLink Physician-Brief Progress Note Patient Name: Bradley Cummings DOB: [birth year] MRN: [id number]   Date of Service  10/16/2023  HPI/Events of Note  Heart failure, active diuresis, K3.5  eICU Interventions  KCl   0040 -sodium trend reviewed, 128 within goal, no intervention off D5 infusion at this time.  Intervention Category Minor Interventions: Electrolytes abnormality - evaluation and management  Latitia Housewright 10/16/2023, 8:30 PM
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
## 2023-10-16 NOTE — Plan of Care (Signed)

## 2023-10-16 NOTE — Consult Note (Addendum)
 Cardiology Consultation   Patient ID: Bradley Cummings MRN: [id number]; DOB: 1990/09/21  Admit date: 10/15/2023 Date of Consult: 10/16/2023  PCP:  Patient, No Pcp Per   [facility] Providers Cardiologist:  None  new  Patient Profile:   Bradley Cummings is a 33 y.o. male with a hx of SDH 2019, ETOH abuse, seizures questionably secondary withdrawal, MDD, who is being seen 10/16/2023 for the evaluation of CHF at the request of Dr Waylan Haggard.  History of Present Illness:   Bradley Cummings initially went to the Urgent Care 04/09/2023 for lower extremity edema that he said had been going on for couple of weeks.  He was also having some chest pain.  He described dyspnea and chest pain with exertion. He also described orthopnea and PND.  He was given oral Lasix .  He had an elevated D-dimer as well as an elevated BNP.  CT PE study was recommended as well as additional evaluation and admission.  He left AMA.  He came back to the Urgent Care 10/15/2022 with continued leg swelling.  He was transferred to the emergency room.  In the ER, he was found to have significant volume overload and was admitted to ICU.  A bedside echo showed an EF of 20-25%.  Abnormal labs included an AST of 1620, ALT 694, total bilirubin 4.9, BNP > 4500, creatinine 1.57, INR 2.7.  Initial sodium was 114, but is now up to 126.  He was seen by GI with concerns for alcoholic hepatitis, workup ongoing.  Cardiology was asked to see for heart failure.  Bradley Cummings was interviewed with his father present, with permission.  He is not able to describe his history very well.  He says that the chest pain has been exertional and up to a 7/10, a tightness.  He has not had any since being in the hospital and thinks it got better as his breathing improved.  He is breathing better than he has in a while.  He was short of breath with any exertion, and also describes orthopnea and PND.  His head is elevated, but he is resting comfortably, no  respiratory distress.   Past Medical History:  Diagnosis Date   Alcohol withdrawal (HCC)    Eczema    ETOH abuse    HFrEF (heart failure with reduced ejection fraction) (HCC)    Hyponatremia with excess extracellular fluid volume 10/15/2023   MDD (major depressive disorder)    Tobacco abuse    Traumatic subdural hematoma (SDH) (HCC) 2019   Fall    History reviewed. No pertinent surgical history.   Home Medications:  Prior to Admission medications   Medication Sig Start Date End Date Taking? Authorizing Provider  aspirin-acetaminophen -caffeine (EXCEDRIN MIGRAINE) 250-250-65 MG tablet Take 1 tablet by mouth every 6 (six) hours as needed for migraine.   Yes [provider]    Inpatient Medications: Scheduled Meds:  Chlorhexidine  Gluconate Cloth  6 each Topical Daily   folic acid   1 mg Oral Daily   furosemide   40 mg Intravenous BID   insulin aspart  0-9 Units Subcutaneous Q4H   multivitamin with minerals  1 tablet Oral Daily   nicotine   21 mg Transdermal Daily   potassium chloride   40 mEq Oral Once   prednisoLONE  40 mg Oral Daily   sodium chloride  flush  10-40 mL Intracatheter Q12H   thiamine   100 mg Oral Daily   Or   thiamine   100 mg Intravenous Daily   Continuous Infusions:  dextrose 150 mL/hr at 10/16/23 1045   magnesium  sulfate bolus IVPB     potassium chloride      thiamine  (VITAMIN B1) injection     PRN Meds: albuterol , artificial tears, docusate sodium , mouth rinse, polyethylene glycol, sodium chloride  flush  Allergies:   No Known Allergies  Social History:   Social History   Socioeconomic History   Marital status: Single    Spouse name: Not on file   Number of children: Not on file   Years of education: Not on file   Highest education level: Not on file  Occupational History   Not on file  Tobacco Use   Smoking status: Every Day    Current packs/day: 0.25    Average packs/day: 0.3 packs/day for 3.0 years (0.8 ttl pk-yrs)    Types:  Cigarettes   Smokeless tobacco: Never  Vaping Use   Vaping status: Some Days  Substance and Sexual Activity   Alcohol use: Yes    Comment: 3-4 40oz beers a day   Drug use: No   Sexual activity: Never  Other Topics Concern   Not on file  Social History Narrative   Not on file   Social Drivers of Health   Financial Resource Strain: Not on file  Food Insecurity: No Food Insecurity (10/15/2023)   Hunger Vital Sign    Worried About Running Out of Food in the Last Year: Never true    Ran Out of Food in the Last Year: Never true  Transportation Needs: No Transportation Needs (10/15/2023)   PRAPARE - Administrator, Civil Service (Medical): No    Lack of Transportation (Non-Medical): No  Physical Activity: Not on file  Stress: Not on file  Social Connections: Moderately Isolated (10/15/2023)   Social Connection and Isolation Panel [NHANES]    Frequency of Communication with Friends and Family: Twice a week    Frequency of Social Gatherings with Friends and Family: Twice a week    Attends Religious Services: Never    Database administrator or Organizations: No    Attends Engineer, structural: 1 to 4 times per year    Marital Status: Widowed  Intimate Partner Violence: Not At Risk (10/15/2023)   Humiliation, Afraid, Rape, and Kick questionnaire    Fear of Current or Ex-Partner: No    Emotionally Abused: No    Physically Abused: No    Sexually Abused: No    Family History:   Family History  Problem Relation Age of Onset   Mental illness Neg Hx      ROS:  Please see the history of present illness.  All other ROS reviewed and negative.     Physical Exam/Data:  Intake/Output Summary (Last 24 hours) at 10/16/2023 1050 Last data filed at 10/16/2023 1000 Gross per 24 hour  Intake 303.89 ml  Output 4100 ml  Net -3796.11 ml      10/16/2023    4:34 AM 10/15/2023    5:30 PM 10/15/2023   12:12 PM  Last 3 Weights  Weight (lbs) 165 lb 5.5 oz 169 lb 15.6 oz 160 lb  0.9 oz  Weight (kg) 75 kg 77.1 kg 72.6 kg     Body mass index is 23.72 kg/m.  General:  Well nourished, well developed, in no acute distress HEENT: normal Neck: JVD 10 cm Vascular: No carotid bruits; Distal pulses 2+ bilaterally Cardiac:  normal S1, S2; RRR; no murmur  Lungs: Rales bases auscultation bilaterally, no wheezing, rhonchi  Abd:  soft, nontender, no hepatomegaly  Ext: 2+ edema to knees Musculoskeletal:  No deformities, BUE and BLE strength normal and equal Skin: warm and dry  Neuro:  CNs 2-12 intact, no focal abnormalities noted Psych:  Normal affect   EKG:  The EKG was personally reviewed and demonstrates: Sinus tach, heart rate 104, normal intervals Telemetry:  Telemetry was personally reviewed and demonstrates: Sinus rhythm  Relevant CV Studies:  ECHO: Ordered  Laboratory Data:  High Sensitivity Troponin:   Recent Labs  Lab 10/15/23 1217 10/15/23 1525  TROPONINIHS 13 10     Chemistry Recent Labs  Lab 10/15/23 1217 10/15/23 1722 10/15/23 2021 10/16/23 0000 10/16/23 0450 10/16/23 0959  NA 114* 114*   < > 122* 124* 126*  K 5.0 5.0  --   --  3.6  --   CL 80*  --   --   --  89*  --   CO2 19*  --   --   --  24  --   GLUCOSE 107*  --   --   --  99  --   BUN 32*  --   --   --  26*  --   CREATININE 1.57*  --   --   --  1.13  --   CALCIUM 8.6*  --   --   --  8.2*  --   MG  --   --   --  1.8  --   --   GFRNONAA 60*  --   --   --  >60  --   ANIONGAP 15  --   --   --  11  --    < > = values in this interval not displayed.    Recent Labs  Lab 10/15/23 1525 10/16/23 0450  PROT 7.5 6.2*  ALBUMIN 3.3* 2.8*  AST 1,620* 1,241*  ALT 694* 605*  ALKPHOS 128* 106  BILITOT 4.9* 4.7*   Lipids No results for input(s): "CHOL", "TRIG", "HDL", "LABVLDL", "LDLCALC", "CHOLHDL" in the last 168 hours.  Hematology Recent Labs  Lab 10/15/23 1217 10/15/23 1722 10/16/23 0450  WBC 7.2  --  4.9  RBC 3.95*  --  3.62*  HGB 11.3* 12.9* 10.5*  HCT 32.5* 38.0* 29.7*   MCV 82.3  --  82.0  MCH 28.6  --  29.0  MCHC 34.8  --  35.4  RDW 19.4*  --  19.5*  PLT 176  --  156   Thyroid  Recent Labs  Lab 10/15/23 2023  TSH 2.886   B Natriuretic Peptide  Date Value Ref Range Status  10/15/2023 >4,500.0 (H) 0.0 - 100.0 pg/mL Final    Comment:    Performed at [facility], [address] [place], [place] [address]  04/10/2023 2,849.5 (H) 0.0 - 100.0 pg/mL Final    Comment:    Performed at [facility], [address] [place], [place] [address]     Radiology/Studies:  [facility] Chest Port 1 View Result Date: 10/16/2023 CLINICAL DATA:  33 year old male with respiratory failure. CHF, abdominal distension and extremity edema. EXAM: PORTABLE CHEST 1 VIEW COMPARISON:  Chest radiographs yesterday. FINDINGS: Portable AP semi upright view at 0620 hours. New right upper extremity approach PICC line, tip at the cavoatrial junction level. Stable cardiomegaly and mediastinal contours. Stable lung volumes. Allowing for portable technique the lungs are clear. No pneumothorax or pleural effusion. Visualized tracheal air column is within normal limits. No osseous abnormality identified. Paucity of  bowel gas. IMPRESSION: 1. New right upper extremity approach PICC line, tip at the cavoatrial junction level. 2. Stable cardiomegaly. No acute cardiopulmonary abnormality. Electronically Signed   By: Marlise Simpers M.D.   On: 10/16/2023 06:37   US  Abdomen Limited RUQ (LIVER/GB) Result Date: 10/15/2023 CLINICAL DATA:  Transaminitis EXAM: ULTRASOUND ABDOMEN LIMITED RIGHT UPPER QUADRANT COMPARISON:  None Available. FINDINGS: Gallbladder: Slight wall thickening at 3.5 mm. Layering sludge within the gallbladder. No stones or sonographic Murphy sign. Common bile duct: Diameter: Normal caliber, 5 mm. Liver: Increased echotexture compatible with fatty infiltration. No focal abnormality or biliary ductal dilatation. Portal vein is patent on color Doppler imaging with normal direction of blood  flow towards the liver. Other: Moderate perihepatic ascites. IMPRESSION: The layering sludge within the gallbladder. No cholelithiasis. Slight gallbladder wall thickening. Fatty liver. Perihepatic ascites. Electronically Signed   By: Janeece Mechanic M.D.   On: 10/15/2023 22:27   CT ABDOMEN PELVIS WO CONTRAST Result Date: 10/15/2023 CLINICAL DATA:  Abdomen pain liver failure EXAM: CT ABDOMEN AND PELVIS WITHOUT CONTRAST TECHNIQUE: Multidetector CT imaging of the abdomen and pelvis was performed following the standard protocol without IV contrast. RADIATION DOSE REDUCTION: This exam was performed according to the departmental dose-optimization program which includes automated exposure control, adjustment of the mA and/or kV according to patient size and/or use of iterative reconstruction technique. COMPARISON:  CT 03/13/2018 FINDINGS: Lower chest: Lung bases demonstrate cardiomegaly. No acute airspace disease or pleural effusion Hepatobiliary: No calcified gallstone. No focal hepatic abnormality without contrast. Hepatic steatosis. No biliary dilatation Pancreas: Unremarkable. No pancreatic ductal dilatation or surrounding inflammatory changes. Spleen: Normal in size without focal abnormality. Adrenals/Urinary Tract: Adrenal glands are normal. Kidneys show no hydronephrosis. The bladder is normal Stomach/Bowel: The stomach is nonenlarged. There is no dilated small bowel. No acute bowel wall thickening. Vascular/Lymphatic: No significant vascular findings are present. No enlarged abdominal or pelvic lymph nodes. Reproductive: Prostate is unremarkable. Other: No free air.  Small volume abdominopelvic ascites Musculoskeletal: No acute osseous abnormality. IMPRESSION: 1. Hepatic steatosis. Small volume abdominopelvic ascites. 2. Cardiomegaly. Electronically Signed   By: Esmeralda Hedge M.D.   On: 10/15/2023 18:20   CT HEAD WO CONTRAST ( ) Result Date: 10/15/2023 CLINICAL DATA:  Altered mental status EXAM: CT HEAD  WITHOUT CONTRAST TECHNIQUE: Contiguous axial images were obtained from the base of the skull through the vertex without intravenous contrast. RADIATION DOSE REDUCTION: This exam was performed according to the departmental dose-optimization program which includes automated exposure control, adjustment of the mA and/or kV according to patient size and/or use of iterative reconstruction technique. COMPARISON:  Head CT 11/17/2018 FINDINGS: Brain: No evidence of acute infarction, hemorrhage, hydrocephalus, extra-axial collection or mass lesion/mass effect. Vascular: No hyperdense vessel or unexpected calcification. Skull: Normal. Negative for fracture or focal lesion. Sinuses/Orbits: No acute finding. Other: None. IMPRESSION: No acute intracranial abnormality. Electronically Signed   By: Tyron Gallon M.D.   On: 10/15/2023 18:15   US  EKG SITE RITE Result Date: 10/15/2023 If Site Rite image not attached, placement could not be confirmed due to current cardiac rhythm.  DG Chest 2 View Result Date: 10/15/2023 CLINICAL DATA:  Chest pain.  Bilateral leg swelling. EXAM: CHEST - 2 VIEW COMPARISON:  Chest radiographs 04/10/2023 FINDINGS: Cardiac silhouette is moderately to markedly enlarged, appearing unchanged given current AP technique and PA technique on most recent 04/10/2023 radiographs. Mediastinal contours are within limits. The lungs are clear. No pulmonary edema. No pleural effusion or pneumothorax. No acute skeletal abnormality. IMPRESSION: Moderate  to marked enlargement of the cardiac silhouette, unchanged. No pulmonary edema. Electronically Signed   By: Bertina Broccoli M.D.   On: 10/15/2023 13:30        Assessment and Plan: Acute systolic CHF -This is his first hospitalization/treatment for the CHF even though it was probably going on back in November when he was in the ER - Bedside echo showed an EF of 20-25%, full echo pending - He put out 4 L of urine overnight, and has had good urine output today  although not as officially documented - He has had improvement in his respiratory symptoms and just chest pain - His blood pressure significantly limits therapies, SBP was in the 80s overnight and is now just over 100. - He is not currently on any blood pressure lowering medications. - Discuss treatment options with MD  2.  Electrolyte abnormalities: - His potassium was initially 5.0, has improved with diuresis.  He may need potassium supplements as he continues to diurese - His LFTs are still very abnormal, but are improving. - His magnesium  has been supplemented. - He is anemic with a hemoglobin of 10.5, with a low-normal MCV - His INR was initially 2.7, has improved a little overnight  3.  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 Agree with above.  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  Workup for alcoholic hepatitis has been ongoing.  IV Lasix  has been utilized and successful at 40 mg twice daily with 3.7 L out as of now.  TSH is normal at 2.8  On exam still has 2+ edema to knees slightly  protuberant abdomen.  Chest x-ray with marked cardiomegaly. Co. 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 -Continuing with IV Lasix  for now.  This will allow for further decongestion and perhaps improvement in forward flow.  So far this has been successful.  Serum sodium has increased secondary to release of free water. -Hopefully we will be able to add spironolactone in the future if blood pressure allows. -Would avoid midodrine given that it is cleared by the liver. -Would not be a candidate for advanced therapies given his ongoing battles with alcohol.  Has seen behavioral health in the past for further counseling.  We will continue to follow. CRITICAL CARE Performed by: Dorothye Gathers   Total critical care time: 40 minutes  Critical care time was exclusive of separately billable  procedures and treating other patients.  Critical care was necessary to treat or prevent imminent or life-threatening deterioration.  Critical care was time spent personally by me on the following activities: development of treatment plan with patient and/or surrogate as well as nursing, discussions with consultants, evaluation of patient's response to treatment, examination of patient, obtaining history from patient or surrogate, ordering and performing treatments and interventions, ordering and review of laboratory studies, ordering and review of radiographic studies, pulse oximetry and re-evaluation of patient's condition.   Dorothye Gathers, MD

## 2023-10-17 ENCOUNTER — Encounter (HOSPITAL_COMMUNITY): Payer: Self-pay | Admitting: Pulmonary Disease

## 2023-10-17 DIAGNOSIS — R079 Chest pain, unspecified: Secondary | ICD-10-CM | POA: Diagnosis not present

## 2023-10-17 DIAGNOSIS — R748 Abnormal levels of other serum enzymes: Secondary | ICD-10-CM | POA: Diagnosis not present

## 2023-10-17 DIAGNOSIS — I429 Cardiomyopathy, unspecified: Secondary | ICD-10-CM

## 2023-10-17 DIAGNOSIS — I5023 Acute on chronic systolic (congestive) heart failure: Secondary | ICD-10-CM | POA: Diagnosis not present

## 2023-10-17 DIAGNOSIS — R7401 Elevation of levels of liver transaminase levels: Secondary | ICD-10-CM | POA: Diagnosis not present

## 2023-10-17 DIAGNOSIS — E871 Hypo-osmolality and hyponatremia: Secondary | ICD-10-CM | POA: Diagnosis not present

## 2023-10-17 DIAGNOSIS — E440 Moderate protein-calorie malnutrition: Secondary | ICD-10-CM

## 2023-10-17 DIAGNOSIS — F331 Major depressive disorder, recurrent, moderate: Secondary | ICD-10-CM

## 2023-10-17 DIAGNOSIS — G9341 Metabolic encephalopathy: Secondary | ICD-10-CM | POA: Diagnosis not present

## 2023-10-17 DIAGNOSIS — I426 Alcoholic cardiomyopathy: Secondary | ICD-10-CM | POA: Diagnosis not present

## 2023-10-17 DIAGNOSIS — F1010 Alcohol abuse, uncomplicated: Secondary | ICD-10-CM | POA: Diagnosis not present

## 2023-10-17 DIAGNOSIS — K7010 Alcoholic hepatitis without ascites: Secondary | ICD-10-CM | POA: Diagnosis not present

## 2023-10-17 LAB — COOXEMETRY PANEL
Carboxyhemoglobin: 1.6 % — ABNORMAL HIGH (ref 0.5–1.5)
Methemoglobin: <0.7 % (ref 0.0–1.5)
O2 Saturation: 68.6 %
Total hemoglobin: 11 g/dL — ABNORMAL LOW (ref 12.0–16.0)

## 2023-10-17 LAB — CBC
HCT: 30.1 % — ABNORMAL LOW (ref 39.0–52.0)
Hemoglobin: 10.4 g/dL — ABNORMAL LOW (ref 13.0–17.0)
MCH: 28.3 pg (ref 26.0–34.0)
MCHC: 34.6 g/dL (ref 30.0–36.0)
MCV: 81.8 fL (ref 80.0–100.0)
Platelets: 169 10*3/uL (ref 150–400)
RBC: 3.68 MIL/uL — ABNORMAL LOW (ref 4.22–5.81)
RDW: 19.9 % — ABNORMAL HIGH (ref 11.5–15.5)
WBC: 3.7 10*3/uL — ABNORMAL LOW (ref 4.0–10.5)
nRBC: 1.1 % — ABNORMAL HIGH (ref 0.0–0.2)

## 2023-10-17 LAB — HEPATIC FUNCTION PANEL
ALT: 485 U/L — ABNORMAL HIGH (ref 0–44)
AST: 891 U/L — ABNORMAL HIGH (ref 15–41)
Albumin: 2.5 g/dL — ABNORMAL LOW (ref 3.5–5.0)
Alkaline Phosphatase: 100 U/L (ref 38–126)
Bilirubin, Direct: 1.3 mg/dL — ABNORMAL HIGH (ref 0.0–0.2)
Indirect Bilirubin: 2 mg/dL — ABNORMAL HIGH (ref 0.3–0.9)
Total Bilirubin: 3.3 mg/dL — ABNORMAL HIGH (ref 0.0–1.2)
Total Protein: 5.8 g/dL — ABNORMAL LOW (ref 6.5–8.1)

## 2023-10-17 LAB — SODIUM
Sodium: 127 mmol/L — ABNORMAL LOW (ref 135–145)
Sodium: 127 mmol/L — ABNORMAL LOW (ref 135–145)
Sodium: 128 mmol/L — ABNORMAL LOW (ref 135–145)
Sodium: 128 mmol/L — ABNORMAL LOW (ref 135–145)
Sodium: 128 mmol/L — ABNORMAL LOW (ref 135–145)

## 2023-10-17 LAB — C-REACTIVE PROTEIN: CRP: 4.5 mg/dL — ABNORMAL HIGH (ref <1.0)

## 2023-10-17 LAB — BASIC METABOLIC PANEL WITH GFR
Anion gap: 10 (ref 5–15)
BUN: 14 mg/dL (ref 6–20)
CO2: 28 mmol/L (ref 22–32)
Calcium: 8 mg/dL — ABNORMAL LOW (ref 8.9–10.3)
Chloride: 89 mmol/L — ABNORMAL LOW (ref 98–111)
Creatinine, Ser: 0.95 mg/dL (ref 0.61–1.24)
GFR, Estimated: >60 mL/min (ref >60)
Glucose, Bld: 114 mg/dL — ABNORMAL HIGH (ref 70–99)
Potassium: 3.4 mmol/L — ABNORMAL LOW (ref 3.5–5.1)
Sodium: 127 mmol/L — ABNORMAL LOW (ref 135–145)

## 2023-10-17 LAB — PROTIME-INR
INR: 2.3 — ABNORMAL HIGH (ref 0.8–1.2)
Prothrombin Time: 25.4 s — ABNORMAL HIGH (ref 11.4–15.2)

## 2023-10-17 LAB — CERULOPLASMIN: Ceruloplasmin: 35.9 mg/dL — ABNORMAL HIGH (ref 16.0–31.0)

## 2023-10-17 LAB — ANTI-SMOOTH MUSCLE ANTIBODY, IGG: F-Actin IgG: 37 U — ABNORMAL HIGH (ref 0–19)

## 2023-10-17 LAB — GLUCOSE, CAPILLARY
Glucose-Capillary: 117 mg/dL — ABNORMAL HIGH (ref 70–99)
Glucose-Capillary: 116 mg/dL — ABNORMAL HIGH (ref 70–99)
Glucose-Capillary: 124 mg/dL — ABNORMAL HIGH (ref 70–99)
Glucose-Capillary: 177 mg/dL — ABNORMAL HIGH (ref 70–99)
Glucose-Capillary: 155 mg/dL — ABNORMAL HIGH (ref 70–99)

## 2023-10-17 LAB — VITAMIN D 25 HYDROXY (VIT D DEFICIENCY, FRACTURES): Vit D, 25-Hydroxy: 24.51 ng/mL — ABNORMAL LOW (ref 30–100)

## 2023-10-17 LAB — IGG: IgG (Immunoglobin G), Serum: 1510 mg/dL (ref 603–1613)

## 2023-10-17 MED ORDER — INSULIN ASPART 100 UNIT/ML IJ SOLN
0.0000 [IU] | Freq: Three times a day (TID) | INTRAMUSCULAR | Status: DC
  Administered 2023-10-17: 1 [IU] via SUBCUTANEOUS
  Administered 2023-10-17: 2 [IU] via SUBCUTANEOUS
  Administered 2023-10-18 – 2023-10-19 (×3): 1 [IU] via SUBCUTANEOUS
  Administered 2023-10-20: 2 [IU] via SUBCUTANEOUS
  Administered 2023-10-21: 1 [IU] via SUBCUTANEOUS
  Administered 2023-10-22: 2 [IU] via SUBCUTANEOUS
  Administered 2023-10-22 – 2023-10-23 (×2): 1 [IU] via SUBCUTANEOUS

## 2023-10-17 MED ORDER — ESCITALOPRAM OXALATE 10 MG PO TABS
5.0000 mg | ORAL_TABLET | Freq: Every day | ORAL | Status: DC
  Administered 2023-10-17 – 2023-10-23 (×7): 5 mg via ORAL
  Filled 2023-10-17 (×7): qty 1

## 2023-10-17 MED ORDER — METOPROLOL TARTRATE 12.5 MG HALF TABLET
12.5000 mg | ORAL_TABLET | Freq: Two times a day (BID) | ORAL | Status: DC
  Administered 2023-10-17 – 2023-10-23 (×12): 12.5 mg via ORAL
  Filled 2023-10-17 (×12): qty 1

## 2023-10-17 MED ORDER — PANTOPRAZOLE SODIUM 40 MG PO TBEC
40.0000 mg | DELAYED_RELEASE_TABLET | Freq: Every day | ORAL | Status: DC
  Administered 2023-10-17 – 2023-10-23 (×7): 40 mg via ORAL
  Filled 2023-10-17 (×7): qty 1

## 2023-10-17 MED ORDER — POTASSIUM CHLORIDE CRYS ER 20 MEQ PO TBCR
40.0000 meq | EXTENDED_RELEASE_TABLET | Freq: Once | ORAL | Status: AC
  Administered 2023-10-17: 40 meq via ORAL
  Filled 2023-10-17: qty 2

## 2023-10-17 MED ORDER — ENSURE ENLIVE PO LIQD
237.0000 mL | Freq: Two times a day (BID) | ORAL | Status: DC
Start: 2023-10-17 — End: 2023-10-23
  Administered 2023-10-17 – 2023-10-23 (×8): 237 mL via ORAL

## 2023-10-17 NOTE — Consult Note (Signed)
 [facility] Psychiatric Consult Initial  Patient Name: .Bradley Cummings  MRN: [id number]  DOB: Nov 25, 1990  Consult Order details:  Orders (From admission, onward)     Start     Ordered   10/17/23 1043  IP CONSULT TO PSYCHIATRY       Comments: Not urgent, Can see 5/19  Ordering Provider: Delories Fetter, NP  Provider:  (Not yet assigned)  Question Answer Comment  Location [facility]   Reason for Consult? etoh abuse // resources      10/17/23 1043             Mode of Visit: In person    Psychiatry Consult Evaluation  Service Date: Oct 17, 2023 LOS:  LOS: 2 days  Chief Complaint "I want to get out of here and get me life together."  Primary Psychiatric Diagnoses  MDD, moderate 2.  Alcohol use disorder  Assessment  Bradley Cummings is a 32 y.o. male admitted: Medicallyfor 10/15/2023 11:57 AM for BLE edema and DOE. He carries the psychiatric diagnoses of alcohol use disorder and  and has a past medical history of depression and alcohol use disorder.   His current presentation of dysphoric mood, self-medicating, low energy, feelings of worthlessness is most consistent with MDD. He meets criteria for MDD and alcohol use d/o based on the DSM V-TR.  Current outpatient psychotropic medications include none. On initial examination, patient appeared ill-looking with some difficulty expanding on answers related to O2 and increase in heart rate when talking. Please see plan below for detailed recommendations.   Diagnoses:  Active Hospital problems: Principal Problem:   Hyponatremia Active Problems:   Alcohol use disorder, moderate, dependence (HCC)   AMS (altered mental status)   Alcoholic hepatitis    Plan   ## Psychiatric Medication Recommendations:  Started Lexapro 5 mg daily Recommended therapy, declined Agreeable to substance abuse IOP, [facility] resources provided  ## Medical Decision Making Capacity: Not specifically addressed in this encounter  ## Further  Work-up:  -- most recent EKG on 10/15/2023 had QtC of 444 -- Pertinent labwork reviewed earlier this admission includes: CBC, chem panel, toxicology, urine, and EKG   ## Disposition:-- There are no psychiatric contraindications to discharge at this time  ## Behavioral / Environmental: - No specific recommendations at this time.     ## Safety and Observation Level:  - Based on my clinical evaluation, I estimate the patient to be at low risk of self harm in the current setting. - At this time, we recommend  routine. This decision is based on my review of the chart including patient's history and current presentation, interview of the patient, mental status examination, and consideration of suicide risk including evaluating suicidal ideation, plan, intent, suicidal or self-harm behaviors, risk factors, and protective factors. This judgment is based on our ability to directly address suicide risk, implement suicide prevention strategies, and develop a safety plan while the patient is in the clinical setting. Please contact our team if there is a concern that risk level has changed.  CSSR Risk Category:C-SSRS RISK CATEGORY: No Risk  Suicide Risk Assessment: Patient has following modifiable risk factors for suicide: untreated depression, which we are addressing by starting Lexapro. Patient has following non-modifiable or demographic risk factors for suicide: male gender and psychiatric hospitalization Patient has the following protective factors against suicide: Supportive family, Frustration tolerance, no history of suicide attempts, and no history of NSSIB  Thank you for this consult request. Recommendations have been communicated to  the primary team.  We will continue to follow at this time.   [name], NP       History of Present Illness  Relevant Aspects of [facility] Course:  Admitted on 10/15/2023 for BLE edema and DOE, consult for alcohol use concerns.  Patient Report:  33  yo male presented with BLE and DOE related to CHF, daily use of alcohol.  Minimizes his use of alcohol of 2 beers per day, high alcohol content.  1/2 ppd of cigarettes per day, denied other substance use.  He rated his depression as "a little bit" related to his physical issues and inability to work.  "I just want to get back in shape to hold a steady job".  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  His hepatitis and other medical issues have negatively impacted his life along with chronic alcohol use, past psychiatric admissions for mood and alcohol detox. Denies suicidal ideations today, sometimes he feels he does not want to be alive, no past suicide attempts.  Some paranoia noted at that time with cannabis use.  Anxiety is moderate and high at times, especially when "I'm trying to solve problems by myself" prior to going to bed which prevents sleep at times along with his alcohol use.  Denies trauma or symptoms.  "I barely eat for some reason at home", dropped from 150 pounds to the 140s.  Two panic attacks in his life.  Denies hallucinations and paranoia.  Discussed options and he would like to go to "drug classes", specifically alcohol.  Recommend substance abuse IOP and he was agreeable.  He lives with his parents who are supportive, "They take good care of me."  Declined therapy, agreeable to an antidepressant.  He was not expansive as he was struggling a little with oxygen and an increase in HR when talking.  Prior to leaving the assessment, set him up for his food tray and he started eating.    Psych ROS:  Depression: moderate Anxiety:  moderate Mania (lifetime and current): denied Psychosis: (lifetime and current): past history of paranoia  Review of Systems  Psychiatric/Behavioral:  Positive for substance abuse.      Psychiatric and Social History  Psychiatric History:  Information collected from patient, chart  Prev Dx/Sx: MDD, alcohol use d/o Current Psych Provider: none Home  Meds (current): no psych meds Previous Med Trials: Abilify , hydroxyzine , sertraline , Trazodone  Therapy: none currently  Prior Psych Hospitalization: [facility]   Prior Self Harm: denied Prior Violence: denied  Family Psych History: none Family Hx suicide: none  Social History:  Educational Hx: some college Occupational Hx: none in the past year due to health issues Legal Hx: none Living Situation: lives with his parents who are supportive  Access to weapons/lethal means: none   Substance History Alcohol: daily beer, reported only 2, high alcohol content  Type of alcohol beer Last Drink prior to admission Tobacco: 1/2 ppd of cigarettes  Exam Findings  Physical Exam: completed by the MD, reviewed Vital Signs:  Temp:  [97.4 F (36.3 C)-98.3 F (36.8 C)] 97.5 F (36.4 C) (05/18 1129) Pulse Rate:  [87-244] 91 (05/18 1200) Resp:  [13-31] 26 (05/18 1200) BP: (90-121)/(60-102) 98/77 (05/18 1200) SpO2:  [93 %-100 %] 93 % (05/18 1200) Weight:  [73.2 kg] 73.2 kg (05/18 0219) Blood pressure 98/77, pulse 91, temperature (!) 97.5 F (36.4 C), temperature source Oral, resp. rate (!) 26, height 5\' 10"  (1.778 m), weight 73.2 kg, SpO2 93%. Body mass index is  23.16 kg/m.  Physical Exam  Mental Status Exam: General Appearance: Casual  Orientation:  Full (Time, Place, and Person)  Memory:  Immediate;   Fair Recent;   Fair Remote;   Fair  Concentration:  Concentration: Fair and Attention Span: Fair  Recall:  Fair  Attention  Fair  Eye Contact:  Fair  Speech:  Clear and Coherent  Language:  Fair  Volume:  Normal  Mood: depressed  Affect:  Appropriate  Thought Process:  Coherent  Thought Content:  Logical  Suicidal Thoughts:  No  Homicidal Thoughts:  No  Judgement:  Fair  Insight:  Fair  Psychomotor Activity:  Decreased  Akathisia:  No  Fund of Knowledge:  Good      Assets:  Desire for Improvement Housing Leisure Time Resilience Social Support  Cognition:  WNL  ADL's:   Impaired  AIMS (if indicated):        Other History   These have been pulled in through the EMR, reviewed, and updated if appropriate.  Family History:  The patient's family history is not on file.  Medical History: Past Medical History:  Diagnosis Date   Alcohol withdrawal (HCC)    Eczema    ETOH abuse    HFrEF (heart failure with reduced ejection fraction) (HCC)    Hyponatremia with excess extracellular fluid volume 10/15/2023   MDD (major depressive disorder)    Tobacco abuse    Traumatic subdural hematoma (SDH) (HCC) 2019   Fall    Surgical History: History reviewed. No pertinent surgical history.   Medications:   Current Facility-Administered Medications:    albuterol  (PROVENTIL ) (2.5 MG/3ML) 0.083% nebulizer solution 2.5 mg, 2.5 mg, Nebulization, Q2H PRN, Elgergawy, Dawood S, MD   artificial tears (LACRILUBE) ophthalmic ointment, , Both Eyes, Q4H PRN, Mannam, Praveen, MD   Chlorhexidine  Gluconate Cloth 2 % PADS 6 each, 6 each, Topical, Daily, Mannam, Praveen, MD, 6 each at 10/17/23 0734   docusate sodium  (COLACE) capsule 100 mg, 100 mg, Oral, BID PRN, Desai, Rahul P, PA-C   folic acid  (FOLVITE ) tablet 1 mg, 1 mg, Oral, Daily, Elgergawy, Dawood S, MD, 1 mg at 10/17/23 2956   furosemide  (LASIX ) injection 40 mg, 40 mg, Intravenous, BID, Desai, Rahul P, PA-C, 40 mg at 10/17/23 0735   insulin aspart (novoLOG) injection 0-9 Units, 0-9 Units, Subcutaneous, TID WC, Mannam, Praveen, MD, 1 Units at 10/17/23 1132   LORazepam  (ATIVAN ) tablet 1-4 mg, 1-4 mg, Oral, Q1H PRN, 1 mg at 10/17/23 0733 **OR** LORazepam  (ATIVAN ) injection 1-4 mg, 1-4 mg, Intravenous, Q1H PRN, Mannam, Praveen, MD, 2 mg at 10/16/23 2017   metoprolol tartrate (LOPRESSOR) tablet 12.5 mg, 12.5 mg, Oral, BID, Dorothye Gathers C, MD, 12.5 mg at 10/17/23 1001   multivitamin with minerals tablet 1 tablet, 1 tablet, Oral, Daily, Elgergawy, Dawood S, MD, 1 tablet at 10/17/23 2130   nicotine  (NICODERM CQ  - dosed in mg/24  hours) patch 21 mg, 21 mg, Transdermal, Daily, Elgergawy, Dawood S, MD, 21 mg at 10/17/23 0740   Oral care mouth rinse, 15 mL, Mouth Rinse, PRN, Mannam, Praveen, MD   pantoprazole  (PROTONIX ) EC tablet 40 mg, 40 mg, Oral, Daily, Bowser, Grace E, NP, 40 mg at 10/17/23 1113   polyethylene glycol (MIRALAX  / GLYCOLAX ) packet 17 g, 17 g, Oral, Daily PRN, Desai, Rahul P, PA-C   prednisoLONE tablet 40 mg, 40 mg, Oral, Daily, Bowser, Grace E, NP, 40 mg at 10/17/23 0734   sodium chloride  flush (NS) 0.9 % injection 10-40 mL, 10-40 mL,  Intracatheter, Q12H, Mannam, Praveen, MD, 10 mL at 10/17/23 0735   sodium chloride  flush (NS) 0.9 % injection 10-40 mL, 10-40 mL, Intracatheter, PRN, Mannam, Praveen, MD   thiamine  (VITAMIN B1) 500 mg in sodium chloride  0.9 % 50 mL IVPB, 500 mg, Intravenous, Once, Elgergawy, Ardia Kraft, MD   thiamine  (VITAMIN B1) tablet 100 mg, 100 mg, Oral, Daily, 100 mg at 10/17/23 0733 **OR** [DISCONTINUED] thiamine  (VITAMIN B1) injection 100 mg, 100 mg, Intravenous, Daily, Oralee Billow I, RPH  Allergies: No Known Allergies  [name], NP

## 2023-10-17 NOTE — Progress Notes (Signed)
 Patient had a 10-beat run of NSVT this morning.  Dr. Renna Cary aware of NSVT 10-20 beats intermittently.  Patient had another run of 8-beat NSVT this evening.

## 2023-10-17 NOTE — Progress Notes (Signed)
 [facility] ADULT ICU REPLACEMENT PROTOCOL   The patient does apply for the [facility] Adult ICU Electrolyte Replacment Protocol based on the criteria listed below:   1.Exclusion criteria: TCTS, ECMO, Dialysis, and Myasthenia Gravis patients 2. Is GFR >/= 30 ml/min? Yes.    Patient's GFR today is >60 3. Is SCr </= 2? Yes.   Patient's SCr is 0.95 mg/dL 4. Did SCr increase >/= 0.5 in 24 hours? No. 5.Pt's weight >40kg  Yes.   6. Abnormal electrolyte(s): K+ 3.4  7. Electrolytes replaced per protocol 8.  Call MD STAT for K+ </= 2.5, Phos </= 1, or Mag </= 1 Physician:  Dr. Silva Drone, Doloris Freund 10/17/2023 6:22 AM

## 2023-10-17 NOTE — Progress Notes (Addendum)
 Inpatient Progress Note     Patient Profile/Chief Complaint  33 y.o. male with history of alcohol dependence, evidence of CHF (EF < 20%) admitted with fatigue, bilateral lower extremity edema, abdominal distention, hyponatremia (Na 114), AKI, elevated LFTs concerning for alcoholic hepatitis.    Interval History   -- Clinically feeling better today -- Started prednisolone for alcoholic hepatitis 10/16/2023; today is day #2 of corticosteroids -- LFTs improving: AST 891, ALT 485, alk phos 100, total bili 3.3, DB 1.3, IB 2.0; PT 27.4, INR 2.3 -- Sodium level has slowly been corrected from 114 to 128    Objective   Vital signs in last 24 hours: Temp:  [97.3 F (36.3 C)-98.3 F (36.8 C)] 97.4 F (36.3 C) (05/18 0721) Pulse Rate:  [87-192] 93 (05/18 1000) Resp:  [13-31] 26 (05/18 1000) BP: (90-111)/(60-85) 102/85 (05/18 1000) SpO2:  [94 %-100 %] 96 % (05/18 1000) Weight:  [73.2 kg] 73.2 kg (05/18 0219) Last BM Date : 10/15/23 General:    Alert, no distress Heart:  Regular rate and rhythm; no murmurs Lungs: Respirations even and unlabored, lungs CTA bilaterally Abdomen:  Soft, nontender, + distension,Normal bowel sounds. Extremities: 2+ edema in lower extremities bilaterally Neurologic: More alert than yesterday, answers questions Psych:  Cooperative. Normal mood and affect.  Intake/Output from previous day: 05/17 0701 - 05/18 0700 In: 1962.5 [P.O.:180; I.V.:1632.2; IV Piggyback:150.4] Out: 4650 [Urine:4650] Intake/Output this shift: Total I/O In: 10 [I.V.:10] Out: -   Lab Results: Recent Labs    10/15/23 1217 10/15/23 1722 10/16/23 0450 10/17/23 0400  WBC 7.2  --  4.9 3.7*  HGB 11.3* 12.9* 10.5* 10.4*  HCT 32.5* 38.0* 29.7* 30.1*  PLT 176  --  156 169   BMET Recent Labs    10/16/23 0450 10/16/23 0959 10/16/23 1635 10/16/23 2127 10/16/23 2350 10/17/23 0400 10/17/23 0741  NA 124*   < > 125*   < > 128* 127*  127* 128*  K 3.6  --  3.5  --   --  3.4*  --    CL 89*  --  88*  --   --  89*  --   CO2 24  --  28  --   --  28  --   GLUCOSE 99  --  228*  --   --  114*  --   BUN 26*  --  18  --   --  14  --   CREATININE 1.13  --  1.00  --   --  0.95  --   CALCIUM 8.2*  --  8.3*  --   --  8.0*  --    < > = values in this interval not displayed.   LFT Recent Labs    10/17/23 0400  PROT 5.8*  ALBUMIN 2.5*  AST 891*  ALT 485*  ALKPHOS 100  BILITOT 3.3*  BILIDIR 1.3*  IBILI 2.0*   PT/INR Recent Labs    10/16/23 0450 10/17/23 0400  LABPROT 27.4* 25.4*  INR 2.5* 2.3*   Acute hepatitis panel negative  Studies/Results: ECHOCARDIOGRAM COMPLETE Result Date: 10/16/2023    ECHOCARDIOGRAM REPORT   Patient Name:   Bradley Cummings Date of Exam: 10/16/2023 Medical Rec #:  [id number]  Height:       70.0 in Accession #:    [id number] Weight:       165.3 lb Date of Birth:  Nov 18, 1990 BSA:          1.925 m Patient Age:  32 years   BP:           102/77 mmHg Patient Gender: M          HR:           90 bpm. Exam Location:  Inpatient Procedure: 2D Echo, Color Doppler, Cardiac Doppler and Intracardiac            Opacification Agent (Both Spectral and Color Flow Doppler were            utilized during procedure). Indications:    I50.21 Acute systolic (congestive) heart failure  History:        Patient has no prior history of Echocardiogram examinations.                 Risk Factors:ETOH.  Sonographer:    Sherline Distel Senior RDCS Referring Phys: [id number] DAWOOD S ELGERGAWY IMPRESSIONS  1. No LV thrombus by Definity. Left ventricular ejection fraction, by estimation, is <20%. The left ventricle has severely decreased function. The left ventricle demonstrates global hypokinesis. The left ventricular internal cavity size was severely dilated. Left ventricular diastolic parameters are consistent with Grade III diastolic dysfunction (restrictive).  2. Right ventricular systolic function is severely reduced. The right ventricular size is moderately enlarged. There is mildly elevated  pulmonary artery systolic pressure. The estimated right ventricular systolic pressure is 38.6 mmHg.  3. Left atrial size was severely dilated.  4. Right atrial size was severely dilated.  5. The mitral valve is abnormal. Moderate to severe mitral valve regurgitation. No evidence of mitral stenosis.  6. The tricuspid valve is abnormal. Tricuspid valve regurgitation is mild to moderate.  7. The aortic valve is tricuspid. Aortic valve regurgitation is mild. No aortic stenosis is present.  8. The pulmonic valve was abnormal. Pulmonic valve regurgitation is moderate.  9. Moderately dilated pulmonary artery. 10. The inferior vena cava is dilated in size with <50% respiratory variability, suggesting right atrial pressure of 15 mmHg. Comparison(s): No prior Echocardiogram. FINDINGS  Left Ventricle: No LV thrombus by Definity. Left ventricular ejection fraction, by estimation, is <20%. The left ventricle has severely decreased function. The left ventricle demonstrates global hypokinesis. Definity contrast agent was given IV to delineate the left ventricular endocardial borders. Strain was performed and the global longitudinal strain is indeterminate. The left ventricular internal cavity size was severely dilated. There is no left ventricular hypertrophy. Left ventricular diastolic parameters are consistent with Grade III diastolic dysfunction (restrictive). Right Ventricle: The right ventricular size is moderately enlarged. No increase in right ventricular wall thickness. Right ventricular systolic function is severely reduced. There is mildly elevated pulmonary artery systolic pressure. The tricuspid regurgitant velocity is 2.43 m/s, and with an assumed right atrial pressure of 15 mmHg, the estimated right ventricular systolic pressure is 38.6 mmHg. Left Atrium: Left atrial size was severely dilated. Right Atrium: Right atrial size was severely dilated. Pericardium: Trivial pericardial effusion is present. Mitral Valve: The  mitral valve is abnormal. Moderate to severe mitral valve regurgitation. No evidence of mitral valve stenosis. Tricuspid Valve: The tricuspid valve is abnormal. Tricuspid valve regurgitation is mild to moderate. No evidence of tricuspid stenosis. Aortic Valve: The aortic valve is tricuspid. Aortic valve regurgitation is mild. No aortic stenosis is present. Pulmonic Valve: The pulmonic valve was abnormal. Pulmonic valve regurgitation is moderate. No evidence of pulmonic stenosis. Aorta: The aortic root and ascending aorta are structurally normal, with no evidence of dilitation. Pulmonary Artery: The pulmonary artery is moderately dilated. Venous: The inferior vena cava is  dilated in size with less than 50% respiratory variability, suggesting right atrial pressure of 15 mmHg. IAS/Shunts: The atrial septum is grossly normal. Additional Comments: 3D was performed not requiring image post processing on an independent workstation and was indeterminate.  LEFT VENTRICLE PLAX 2D LVIDd:         7.70 cm      Diastology LVIDs:         6.60 cm      LV e' medial:    5.44 cm/s LV PW:         0.70 cm      LV E/e' medial:  20.8 LV IVS:        0.60 cm      LV e' lateral:   10.00 cm/s LVOT diam:     2.20 cm      LV E/e' lateral: 11.3 LV SV:         35 LV SV Index:   18 LVOT Area:     3.80 cm  LV Volumes (MOD) LV vol d, MOD A2C: 323.0 ml LV vol d, MOD A4C: 284.0 ml LV vol s, MOD A2C: 261.0 ml LV vol s, MOD A4C: 223.0 ml LV SV MOD A2C:     62.0 ml LV SV MOD A4C:     284.0 ml LV SV MOD BP:      59.0 ml RIGHT VENTRICLE RV S prime:     9.57 cm/s TAPSE (M-mode): 1.9 cm LEFT ATRIUM              Index        RIGHT ATRIUM           Index LA diam:        4.70 cm  2.44 cm/m   RA Area:     37.40 cm LA Vol (A2C):   122.0 ml 63.37 ml/m  RA Volume:   163.00 ml 84.67 ml/m LA Vol (A4C):   98.0 ml  50.90 ml/m LA Biplane Vol: 114.0 ml 59.22 ml/m  AORTIC VALVE LVOT Vmax:   55.10 cm/s LVOT Vmean:  43.700 cm/s LVOT VTI:    0.092 m  AORTA Ao Root  diam: 2.90 cm Ao Asc diam:  3.30 cm MITRAL VALVE                TRICUSPID VALVE MV Area (PHT): 4.63 cm     TR Peak grad:   23.6 mmHg MV Decel Time: 164 msec     TR Vmax:        243.00 cm/s MV E velocity: 113.00 cm/s                             SHUNTS                             Systemic VTI:  0.09 m                             Systemic Diam: 2.20 cm Vishnu Priya Mallipeddi Electronically signed by Lucetta Russel Mallipeddi Signature Date/Time: 10/16/2023/1:44:08 PM    Final    DG Chest Port 1 View Result Date: 10/16/2023 CLINICAL DATA:  33 year old male with respiratory failure. CHF, abdominal distension and extremity edema. EXAM: PORTABLE CHEST 1 VIEW COMPARISON:  Chest radiographs yesterday. FINDINGS: Portable AP semi upright view at 0620 hours. New right upper extremity  approach PICC line, tip at the cavoatrial junction level. Stable cardiomegaly and mediastinal contours. Stable lung volumes. Allowing for portable technique the lungs are clear. No pneumothorax or pleural effusion. Visualized tracheal air column is within normal limits. No osseous abnormality identified. Paucity of bowel gas. IMPRESSION: 1. New right upper extremity approach PICC line, tip at the cavoatrial junction level. 2. Stable cardiomegaly. No acute cardiopulmonary abnormality. Electronically Signed   By: Marlise Simpers M.D.   On: 10/16/2023 06:37   US  Abdomen Limited RUQ (LIVER/GB) Result Date: 10/15/2023 CLINICAL DATA:  Transaminitis EXAM: ULTRASOUND ABDOMEN LIMITED RIGHT UPPER QUADRANT COMPARISON:  None Available. FINDINGS: Gallbladder: Slight wall thickening at 3.5 mm. Layering sludge within the gallbladder. No stones or sonographic Murphy sign. Common bile duct: Diameter: Normal caliber, 5 mm. Liver: Increased echotexture compatible with fatty infiltration. No focal abnormality or biliary ductal dilatation. Portal vein is patent on color Doppler imaging with normal direction of blood flow towards the liver. Other: Moderate perihepatic  ascites. IMPRESSION: The layering sludge within the gallbladder. No cholelithiasis. Slight gallbladder wall thickening. Fatty liver. Perihepatic ascites. Electronically Signed   By: Janeece Mechanic M.D.   On: 10/15/2023 22:27   CT ABDOMEN PELVIS WO CONTRAST Result Date: 10/15/2023 CLINICAL DATA:  Abdomen pain liver failure EXAM: CT ABDOMEN AND PELVIS WITHOUT CONTRAST TECHNIQUE: Multidetector CT imaging of the abdomen and pelvis was performed following the standard protocol without IV contrast. RADIATION DOSE REDUCTION: This exam was performed according to the departmental dose-optimization program which includes automated exposure control, adjustment of the mA and/or kV according to patient size and/or use of iterative reconstruction technique. COMPARISON:  CT 03/13/2018 FINDINGS: Lower chest: Lung bases demonstrate cardiomegaly. No acute airspace disease or pleural effusion Hepatobiliary: No calcified gallstone. No focal hepatic abnormality without contrast. Hepatic steatosis. No biliary dilatation Pancreas: Unremarkable. No pancreatic ductal dilatation or surrounding inflammatory changes. Spleen: Normal in size without focal abnormality. Adrenals/Urinary Tract: Adrenal glands are normal. Kidneys show no hydronephrosis. The bladder is normal Stomach/Bowel: The stomach is nonenlarged. There is no dilated small bowel. No acute bowel wall thickening. Vascular/Lymphatic: No significant vascular findings are present. No enlarged abdominal or pelvic lymph nodes. Reproductive: Prostate is unremarkable. Other: No free air.  Small volume abdominopelvic ascites Musculoskeletal: No acute osseous abnormality. IMPRESSION: 1. Hepatic steatosis. Small volume abdominopelvic ascites. 2. Cardiomegaly. Electronically Signed   By: Esmeralda Hedge M.D.   On: 10/15/2023 18:20   CT HEAD WO CONTRAST ( ) Result Date: 10/15/2023 CLINICAL DATA:  Altered mental status EXAM: CT HEAD WITHOUT CONTRAST TECHNIQUE: Contiguous axial images were  obtained from the base of the skull through the vertex without intravenous contrast. RADIATION DOSE REDUCTION: This exam was performed according to the departmental dose-optimization program which includes automated exposure control, adjustment of the mA and/or kV according to patient size and/or use of iterative reconstruction technique. COMPARISON:  Head CT 11/17/2018 FINDINGS: Brain: No evidence of acute infarction, hemorrhage, hydrocephalus, extra-axial collection or mass lesion/mass effect. Vascular: No hyperdense vessel or unexpected calcification. Skull: Normal. Negative for fracture or focal lesion. Sinuses/Orbits: No acute finding. Other: None. IMPRESSION: No acute intracranial abnormality. Electronically Signed   By: Tyron Gallon M.D.   On: 10/15/2023 18:15   US  EKG SITE RITE Result Date: 10/15/2023 If Site Rite image not attached, placement could not be confirmed due to current cardiac rhythm.  DG Chest 2 View Result Date: 10/15/2023 CLINICAL DATA:  Chest pain.  Bilateral leg swelling. EXAM: CHEST - 2 VIEW COMPARISON:  Chest radiographs 04/10/2023 FINDINGS:  Cardiac silhouette is moderately to markedly enlarged, appearing unchanged given current AP technique and PA technique on most recent 04/10/2023 radiographs. Mediastinal contours are within limits. The lungs are clear. No pulmonary edema. No pleural effusion or pneumothorax. No acute skeletal abnormality. IMPRESSION: Moderate to marked enlargement of the cardiac silhouette, unchanged. No pulmonary edema. Electronically Signed   By: Bertina Broccoli M.D.   On: 10/15/2023 13:30    Endoscopic Studies: None   Clinical Impression   It is my clinical impression that Mr. 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 Bradley Cummings  has a history of alcohol dependence and presents with with fatigue, bilateral lower extremity edema, abdominal distention, hyponatremia (Na 114), AKI, evidence of cardiomyopathy (EF < 20%) and elevated LFTs concerning for alcoholic hepatitis +/- congestive hepatopathy.    He is known to have a component of alcohol dependence but it would be prudent to consider ruling out other etiologies of chronic liver disease as well.  Acute hepatitis panel is negative.  Serologies pending for autoimmune hepatitis and Wilson's disease.  Maddrey discriminant score calculated 10/16/23 was 61 and started on prednisolone 40 mg orally daily.  Today is day #2 of corticosteroids and LFTs are showing improvement with total bilirubin decreased to 3.3 and associated decline in hepatic transaminases.  Kidney function is improving.  Continuing diuresis per ICU.   Plan  Sinew prednisolone 40 mg orally daily -today is day #2; calculate Lille score at day #7 Recommend  PPI-Protonix  40 mg orally daily in conjunction with initiation of prednisolone for gastric protection Follow trend and daily hepatic function panel in conjunction with kidney function and electrolytes Follow-up remainder of chronic hepatitides workup-autoimmune hepatitis serologies and ceruloplasmin Monitor for signs and symptoms of alcohol withdrawal Continue diuresis with Lasix  Agree with advancing diet; continuing nutritional support with multivitamin, thiamine  and folate  Dr. Cherryl Corona will assume rounding responsibilities for [facility] tomorrow 10/18/2023   LOS: 2 days   Truddie Furrow  10/17/2023, 11:00 AM  Eugenia Hess, MD  GI

## 2023-10-17 NOTE — Progress Notes (Addendum)
 To bedside to talk w pt who had voiced wanting to leave AMA  In talking w pt I am suspicious that he may not have capacity to make this decision based on very poor insight of possible consequences of leaving and poor insight re current problems being treated.  Regardless, he is now voicing agreement w remaining inpt for ongoing care and management.   Requested we call his dad -- both myself and his RN have attempted without success. VM is full so unable to leave compliant message.      Eston Hence MSN, AGACNP-BC [facility] Pulmonary/Critical Care Medicine 10/17/2023, 2:23 PM

## 2023-10-17 NOTE — Discharge Instructions (Addendum)
 Chemical dependence intensive outpatient program The address is [address], [place], [place] [address].  Under the guidance of a licensed mental health professional, meet with your peers every Monday, Wednesday and Friday from 9 a.m. to 12 p.m. for 6 to 8 weeks to:  Learn about chemical dependency, mental illness and co-occurring disorders. Develop relapse-prevention skills. Set personalized goals with your treatment team.  For more information, call Melynda Stagger, LCSW at [phone]. We work directly with employers and families to ensure you receive the care you need.   [facility]: [facility] Substance Abuse Intensive Outpatient  [address]  [phone]                   Outpatient Substance Abuse   Treatment- uninsured Narcotics Anonymous 24-HOUR HELPLINE Pre-recorded for Meeting Schedules [facility] [phone]  [email or web address] ALCOHOLICS ANONYMOUS  [place]   Answering Service [phone] Please Note: All [place] Meetings are Non-smoking [facility] -  Insurance: Medicaid /State funding/private insurance Methadone, suboxone/Intensive outpatient  [place]   [phone] Fax: [phone] [address] [place], [place], [address] [place] [phone] Fax: [phone]    [address], [place], [place], [address] ([address] [place], [place], [place], [place], [place], [place], [place], [place]) [facility] [email or web address] Accepts State funding/Medicaid Transitional housing, Intensive Outpatient Treatment, Outpatient treatment, Veterans Services  Phone: [phone] Fax: [phone] Address: [address] [place] [address]  [facility] ([email or web address] Insurance: Medicaid Case Management, Administrator, arts, Medication Management, Outpatient Therapy, Psychosocial Rehabilitation, Substance Abuse Intensive Outpatient   Phone: [phone] Fax: [phone] [address], [place], [place], [address]  [facility] Medicaid, most private insurance providers Types of Program: Individual/Group Therapy, Substance Abuse Treatment  Phone: [place] [phone] Fax: [phone] [address], [place], [place], [address] [place] [phone] [address] [place], [place], [address] [facility]  Medicaid Types of Program: [facility]  Phone: [phone] Fax: [phone] [address] [place], [place], [place], [address] [facility] Medicaid/state funds Crisis line [phone] [facility] [phone] [place] [phone] [place] [phone]  [facility] [address];  [place], [place] [address] [phone]  Substance Abuse Intensive Outpatient Program [facility] [address] [place], [place] [address] [phone]- Substance abuse treatment Successful Transitions  Insurance: [facility], [address], sliding scale Types of Program: substance abuse treatment, transportation assistance Phone: [phone] Fax: [phone] Address: [address], [place] [address] [facility] ([email or web address]) Insurance: UHC, [place], [place], [place] Program: addiction counseling, detoxification,  Phone: [phone]  Fax: [phone] Address: [address] [place], [place] [address]  [facility] (statewide facilities/programs) [address] (Medicaid/state funds) [place], [place] [address]                      [email or web address] [email or web address] Jayson Michael- [phone] [place]- [phone]   [facility] (2 Locations) (Medicaid/state funds) --[address]  walk in 8:30-12 and 1-2:30 [place], [id number]   [facility]- [phone] --[address] [place], [place] [address]  RA-076 [phone] walk in  8:30-12 and 2-3:30 [facility] state funds/medicaid [address] [place], [place] [address] [phone] [facility] (Suboxone clinic) Medicaid/state funds  [address]  [place], [place]  [phone]  [facility]  [address], [place], [place] [address]  [phone] (24 hours) [place]- [address] [place], [place] [address]  [phone] (24 hours) [place]- [address] Alene Husk [phone] [place]- [address]  [phone] Carry Clapper [address] [place] [phone] [facility]- Medicaid and state funds  [employer]- [address] [place] [address] [place], [place] [address] [phone] (24 hours) [place]- [address] [place], [place] [address] [phone] [facility]- [address] [place], [place] [address] [phone] (24 hours) [place] [address] [place], [place]  [address] [phone] [place]- [address] [facility] [address] Selene Dais [phone]  Low Sodium Nutrition Therapy  Eating less sodium can help you if you have high blood pressure, heart failure, or kidney or liver disease.   Your body needs a little sodium, but too much sodium can cause your body to hold onto extra water. This extra water will raise your blood pressure and can cause damage to your heart, kidneys, or liver as they are forced to work harder.   Sometimes you can see how the extra fluid affects you because your hands, legs, or belly swell. You may also hold water around your heart and lungs, which makes it hard to breathe.   Even if you take medication for blood pressure or a water pill (diuretic) to remove fluid, it is still important to have less salt in your diet.   Check with your primary care provider before drinking alcohol since it may affect the amount of fluid in your body and how your heart, kidneys, or liver work. Sodium in Food A low-sodium meal plan limits the sodium that you get from food and beverages to 1,500-2,000 milligrams (mg) per day. Salt is the  main source of sodium. Read the nutrition label on the package to find out how much sodium is in one serving of a food.  Select foods with 140 milligrams (mg) of sodium or less per serving.  You may be able to eat one or two servings of foods with a little more than 140 milligrams (mg) of sodium if you are closely watching how much sodium you eat in a day.  Check the serving size on the label. The amount of sodium listed on the label shows the amount in one serving of the food. So, if you eat more than one serving, you will get more sodium than the amount listed.  Tips Cutting Back on Sodium Eat more fresh foods.  Fresh fruits and vegetables are low in sodium, as well as frozen vegetables and fruits that have no added juices or sauces.  Fresh meats are lower in sodium than processed meats, such as bacon, sausage, and hotdogs.  Not all processed foods are unhealthy, but some processed foods may have too much sodium.  Eat less salt at the table and when cooking. One of the ingredients in salt is sodium.  One teaspoon of table salt has 2,300 milligrams of sodium.  Leave the salt out of recipes for pasta, casseroles, and soups. Be a Engineer, building services.  Food packages that say "Salt-free", sodium-free", "very low sodium," and "low sodium" have less than 140 milligrams of sodium per serving.  Beware of products identified as "Unsalted," "No Salt Added," "Reduced Sodium," or "Lower Sodium." These items may still be high in sodium. You should always check the nutrition label. Add flavors to your food without adding sodium.  Try lemon juice, lime juice, or vinegar.  Dry or fresh herbs add flavor.  Buy a sodium-free seasoning blend  or make your own at home. You can purchase salt-free or sodium-free condiments like barbeque sauce in stores and online. Ask your registered dietitian nutritionist for recommendations and where to find them.   Eating in Restaurants Choose foods carefully when you eat outside your  home. Restaurant foods can be very high in sodium. Many restaurants provide nutrition facts on their menus or their websites. If you cannot find that information, ask your server. Let your server know that you want your food to be cooked without salt and that you would like your salad dressing and sauces to be served on the side.    Foods Recommended Food Group Foods Recommended  Grains Bread, bagels, rolls without salted tops Homemade bread made with reduced-sodium baking powder Cold cereals, especially shredded wheat and puffed rice Oats, grits, or cream of wheat Pastas, quinoa, and rice Popcorn, pretzels or crackers without salt Corn tortillas  Protein Foods Fresh meats and fish; [place] bacon (check the nutrition labels - make sure they are not packaged in a sodium solution) Canned or packed tuna (no more than 4 ounces at 1 serving) Beans and peas Soybeans) and tofu Eggs Nuts or nut butters without salt  Dairy Milk or milk powder Plant milks, such as rice and soy Yogurt, including Greek yogurt Small amounts of natural cheese (blocks of cheese) or reduced-sodium cheese can be used in moderation. (Swiss, ricotta, and fresh mozzarella cheese are lower in sodium than the others) Cream Cheese Low sodium cottage cheese  Vegetables Fresh and frozen vegetables without added sauces or salt Homemade soups (without salt) Low-sodium, salt-free or sodium-free canned vegetables and soups  Fruit Fresh and canned fruits Dried fruits, such as raisins, cranberries, and prunes  Oils Tub or liquid margarine, regular or without salt Canola, corn, peanut, olive, safflower, or sunflower oils  Condiments Fresh or dried herbs such as basil, bay leaf, dill, mustard (dry), nutmeg, paprika, parsley, rosemary, sage, or thyme.  Low sodium ketchup Vinegar  Lemon or lime juice Pepper, red pepper flakes, and cayenne. Hot sauce contains sodium, but if you use just a drop or two, it will not add up to much.   Salt-free or sodium-free seasoning mixes and marinades Simple salad dressings: vinegar and oil   Foods Not Recommended Food Group Foods Not Recommended  Grains Breads or crackers topped with salt Cereals (hot/cold) with more than 300 mg sodium per serving Biscuits, cornbread, and other "quick" breads prepared with baking soda Pre-packaged bread crumbs Seasoned and packaged rice and pasta mixes Self-rising flours  Protein Foods Cured meats: Bacon, ham, sausage, pepperoni and hot dogs Canned meats (chili, vienna sausage, or sardines) Smoked fish and meats Frozen meals that have more than 600 mg of sodium per serving Egg substitute (with added sodium)  Dairy Buttermilk Processed cheese spreads Cottage cheese (1 cup may have over 500 mg of sodium; look for low-sodium.) American or feta cheese Shredded Cheese has more sodium than blocks of cheese String cheese  Vegetables Canned vegetables (unless they are salt-free, sodium-free or low sodium) Frozen vegetables with seasoning and sauces Sauerkraut and pickled vegetables Canned or dried soups (unless they are salt-free, sodium-free, or low sodium) [place] fries and onion rings  Fruit Dried fruits preserved with additives that have sodium  Oils Salted butter or margarine, all types of olives  Condiments Salt, sea salt, kosher salt, onion salt, and garlic salt Seasoning mixes with salt Bouillon cubes Ketchup Barbeque sauce and Worcestershire sauce unless low sodium Soy sauce Salsa, pickles, olives, relish  Salad dressings: ranch, blue cheese, [place], and [place].   Low Sodium Sample 1-Day Menu  Breakfast 1 cup cooked oatmeal  1 slice whole wheat bread toast  1 tablespoon peanut butter without salt  1 banana  1 cup 1% milk  Lunch Tacos made with: 2 corn tortillas   cup black beans, low sodium   cup roasted or grilled chicken (without skin)   avocado  Squeeze of lime juice  1 cup salad greens  1 tablespoon low-sodium salad  dressing   cup strawberries  1 orange  Afternoon Snack 1/3 cup grapes  6 ounces yogurt  Evening Meal 3 ounces herb-baked fish  1 baked potato  2 teaspoons olive oil   cup cooked carrots  2 thick slices tomatoes on:  2 lettuce leaves  1 teaspoon olive oil  1 teaspoon balsamic vinegar  1 cup 1% milk  Evening Snack 1 apple   cup almonds without salt   Low-Sodium Vegetarian (Lacto-Ovo) Sample 1-Day Menu  Breakfast 1 cup cooked oatmeal  1 slice whole wheat toast  1 tablespoon peanut butter without salt  1 banana  1 cup 1% milk  Lunch Tacos made with: 2 corn tortillas   cup black beans, low sodium   cup roasted or grilled chicken (without skin)   avocado  Squeeze of lime juice  1 cup salad greens  1 tablespoon low-sodium salad dressing   cup strawberries  1 orange  Evening Meal Stir fry made with:  cup tofu  1 cup brown rice   cup broccoli   cup green beans   cup peppers   tablespoon peanut oil  1 orange  1 cup 1% milk  Evening Snack 4 strips celery  2 tablespoons hummus  1 hard-boiled egg   Low-Sodium Vegan Sample 1-Day Menu  Breakfast 1 cup cooked oatmeal  1 tablespoon peanut butter without salt  1 cup blueberries  1 cup soymilk fortified with calcium, vitamin B12, and vitamin D   Lunch 1 small whole wheat pita   cup cooked lentils  2 tablespoons hummus  4 carrot sticks  1 medium apple  1 cup soymilk fortified with calcium, vitamin B12, and vitamin D   Evening Meal Stir fry made with:  cup tofu  1 cup brown rice   cup broccoli   cup green beans   cup peppers   tablespoon peanut oil  1 cup cantaloupe  Evening Snack 1 cup soy yogurt   cup mixed nuts  Copyright 2020  Academy of Nutrition and Dietetics. All rights reserved  Sodium Free Flavoring Tips  When cooking, the following items may be used for flavoring instead of salt or seasonings that contain sodium. Remember: A little bit of spice goes a long way! Be careful not to  overseason. Spice Blend Recipe (makes about ? cup) 5 teaspoons onion powder  2 teaspoons garlic powder  2 teaspoons paprika  2 teaspoon dry mustard  1 teaspoon crushed thyme leaves   teaspoon white pepper   teaspoon celery seed Food Item Flavorings  Beef Basil, bay leaf, caraway, curry, dill, dry mustard, garlic, grape jelly, green pepper, mace, marjoram, mushrooms (fresh), nutmeg, onion or onion powder, parsley, pepper, rosemary, sage  Chicken Basil, cloves, cranberries, mace, mushrooms (fresh), nutmeg, oregano, paprika, parsley, pineapple, saffron, sage, savory, tarragon, thyme, tomato, turmeric  Egg Chervil, curry, dill, dry mustard, garlic or garlic powder, green pepper, jelly, mushrooms (fresh), nutmeg, onion powder, paprika, parsley, rosemary, tarragon, tomato  Fish Basil, bay leaf, chervil, curry, dill, dry mustard,  green pepper, lemon juice, marjoram, mushrooms (fresh), paprika, pepper, tarragon, tomato, turmeric  Lamb Cloves, curry, dill, garlic or garlic powder, mace, mint, mint jelly, onion, oregano, parsley, pineapple, rosemary, tarragon, thyme  Pork Applesauce, basil, caraway, chives, cloves, garlic or garlic powder, onion or onion powder, rosemary, thyme  Veal Apricots, basil, bay leaf, currant jelly, curry, ginger, marjoram, mushrooms (fresh), oregano, paprika  Vegetables Basil, dill, garlic or garlic powder, ginger, lemon juice, mace, marjoram, nutmeg, onion or onion powder, tarragon, tomato, sugar or sugar substitute, salt-free salad dressing, vinegar  Desserts Allspice, anise, cinnamon, cloves, ginger, mace, nutmeg, vanilla extract, other extracts   Copyright 2020  Academy of Nutrition and Dietetics. All rights reserved  Fluid Restricted Nutrition Therapy  You have been prescribed this diet because your condition affects how much fluid you can eat or drink. If your heart, liver, or kidneys aren't working properly, you may not be able to effectively eliminate fluids  from the body and this may cause swelling (edema) in the legs, arms, and/or stomach. Drink no more than _________ liters or ________ ounces or ________cups of fluid per day.  You don't need to stop eating or drinking the same fluids you normally would, but you may need to eat or drink less than usual.  Your registered dietitian nutritionist will help you determine the correct amount of fluid to consume during the day Breakfast Include fluids taken with medications  Lunch Include fluids taken with medications  Dinner Include fluids taken with medications  Bedtime Snack Include fluids taken with medications     Tips What Are Fluids?  A fluid is anything that is liquid or anything that would melt if left at room temperature. You will need to count these foods and liquids--including any liquid used to take medication--as part of your daily fluid intake. Some examples are: Alcohol (drink only with your doctor's permission)  Coffee, tea, and other hot beverages  Gelatin (Jell-O)  Gravy  Ice cream, sherbet, sorbet  Ice cubes, ice chips  Milk, liquid creamer  Nutritional supplements  Popsicles  Vegetable and fruit juices; fluid in canned fruit  Watermelon  Yogurt  Soft drinks, lemonade, limeade  Soups  Syrup How Do I Measure My Fluid Intake? Record your fluid intake daily.  Tip: Every day, each time you eat or drink fluids, pour water in the same amount into an empty container that can hold the same amount of fluids you are allowed daily. This may help you keep track of how much fluid you are taking in throughout the day.  To accurately keep track of how much liquid you take in, measure the size of the cups, glasses, and bowls you use. If you eat soup, measure how much of it is liquid and how much is solid (such as noodles, vegetables, meat). Conversions for Measuring Fluid Intake  Milliliters (mL) Liters (L) Ounces (oz) Cups (c)  1000 1 32 4  1200 1.2 40 5  1500 1.5 50 6 1/4  1800 1.8  60 7 1/2  2000 2 67 8 1/3  Tips to Reduce Your Thirst Chew gum or suck on hard candy.  Rinse or gargle with mouthwash. Do not swallow.  Ice chips or popsicles my help quench thirst, but this too needs to be calculated into the total restriction. Melt ice chips or cubes first to figure out how much fluid they produce (for example, experiment with melting  cup ice chips or 2 ice cubes).  Add a lemon wedge to your water.  Limit how much salt you take in. A high salt intake might make you thirstier.  Don't eat or drink all your allowed liquids at once. Space your liquids out through the day.  Use small glasses and cups and sip slowly. If allowed, take your medications with fluids you eat or drink during a meal.   Fluid-Restricted Nutrition Therapy Sample 1-Day Menu  Breakfast 1 slice wheat toast  1 tablespoon peanut butter  1/2 cup yogurt (120 milliliters)  1/2 cup blueberries  1 cup milk (240 milliliters)   Lunch 3 ounces sliced [place]  2 slices whole wheat bread  1/2 cup lettuce for sandwich  2 slices tomato for sandwich  1 ounce reduced-fat, reduced-sodium cheese  1/2 cup fresh carrot sticks  1 banana  1 cup unsweetened tea (240 milliliters)   Evening Meal 8 ounces soup (240 milliliters)  3 ounces salmon  1/2 cup quinoa  1 cup green beans  1 cup mixed greens salad  1 tablespoon olive oil  1 cup coffee (240 milliliters)  Evening Snack 1/2 cup sliced peaches  1/2 cup frozen yogurt (120 milliliters)  1 cup water (240 milliliters)  Copyright 2020  Academy of Nutrition and Dietetics. All rights reserved
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
## 2023-10-17 NOTE — Plan of Care (Signed)
   Problem: Education: Goal: Knowledge of General Education information will improve Description: Including pain rating scale, medication(s)/side effects and non-pharmacologic comfort measures Outcome: Progressing   Problem: Health Behavior/Discharge Planning: Goal: Ability to manage health-related needs will improve Outcome: Progressing   Problem: Clinical Measurements: Goal: Ability to maintain clinical measurements within normal limits will improve Outcome: Progressing Goal: Will remain free from infection Outcome: Progressing Goal: Diagnostic test results will improve Outcome: Progressing Goal: Respiratory complications will improve Outcome: Progressing Goal: Cardiovascular complication will be avoided Outcome: Progressing   Problem: Activity: Goal: Risk for activity intolerance will decrease Outcome: Progressing   Problem: Nutrition: Goal: Adequate nutrition will be maintained Outcome: Progressing   Problem: Pain Managment: Goal: General experience of comfort will improve and/or be controlled Outcome: Progressing   Problem: Safety: Goal: Ability to remain free from injury will improve Outcome: Progressing   Problem: Skin Integrity: Goal: Risk for impaired skin integrity will decrease Outcome: Progressing

## 2023-10-17 NOTE — Progress Notes (Signed)
   Patient Name: Stephenson Cichy Date of Encounter: 10/17/2023 [facility] Cardiologist: None   Interval Summary  .    Asking for some more ice. 10-20 beat run of nonsustained VT on telemetry asymptomatic  Vital Signs .    Vitals:   10/17/23 0700 10/17/23 0721 10/17/23 0800 10/17/23 0900  BP: 95/75  109/84 98/79  Pulse: 93  (!) 102 93  Resp: (!) 28  (!) 28 (!) 24  Temp:  (!) 97.4 F (36.3 C)    TempSrc:  Oral    SpO2: 94%  95% 94%  Weight:      Height:        Intake/Output Summary (Last 24 hours) at 10/17/2023 0942 Last data filed at 10/17/2023 0735 Gross per 24 hour  Intake 1865.17 ml  Output 4650 ml  Net -2784.83 ml      10/17/2023    2:19 AM 10/16/2023    4:34 AM 10/15/2023    5:30 PM  Last 3 Weights  Weight (lbs) 161 lb 6 oz 165 lb 5.5 oz 169 lb 15.6 oz  Weight (kg) 73.2 kg 75 kg 77.1 kg      Telemetry/ECG    Nonsustained VT otherwise sinus rhythm- Personally Reviewed  Physical Exam .   GEN: No acute distress.   Neck: No JVD Cardiac: RRR, no murmurs, rubs, or gallops.  Respiratory: Clear to auscultation bilaterally. GI: Soft, nontender, non-distended  MS: 1+ BLE edema  Assessment & Plan .     Alcoholic cardiomyopathy, acute on chronic systolic heart failure EF 20% -Breathing more comfortably today, edema is down - I will add metoprolol 12.5 mg twice a day, low-dose given relatively soft blood pressures.  This may help as well with his nonsustained regular tachycardia seen on telemetry. -Keep potassium greater than 4 mag greater than 2 -Unable to push any other goal-directed medical therapy at this time. -Okay to continue with IV Lasix  40 mg twice daily-creatinine 0.95 potassium 3.4-replete.  Continues with good output.  4.6 L out yesterday.  Hyponatremia - Serum sodium now 128.  Improved with Lasix .  Try to restrict free water if possible.  Alcohol - Watch for signs of withdrawal.  Has had seizures in the past.  Hepatitis - AST greater than  ALT secondary to alcohol.  AST is down to 891, ALT at 485.  Improved. -Prednisone 40 mg orally initiated yesterday.  Protonix .  GI following.  CRITICAL CARE Performed by: Dorothye Gathers   Total critical care time: 35 minutes  Critical care time was exclusive of separately billable procedures and treating other patients.  Critical care was necessary to treat or prevent imminent or life-threatening deterioration.  Critical care was time spent personally by me on the following activities: development of treatment plan with patient and/or surrogate as well as nursing, discussions with consultants, evaluation of patient's response to treatment, examination of patient, obtaining history from patient or surrogate, ordering and performing treatments and interventions, ordering and review of laboratory studies, ordering and review of radiographic studies, pulse oximetry and re-evaluation of patient's condition.   For questions or updates, please contact [facility] Please consult [email or web address] for contact info under        Signed, Dorothye Gathers, MD
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
## 2023-10-18 DIAGNOSIS — K7011 Alcoholic hepatitis with ascites: Secondary | ICD-10-CM | POA: Diagnosis not present

## 2023-10-18 DIAGNOSIS — I517 Cardiomegaly: Secondary | ICD-10-CM | POA: Insufficient documentation

## 2023-10-18 DIAGNOSIS — R400 Somnolence: Secondary | ICD-10-CM

## 2023-10-18 DIAGNOSIS — N179 Acute kidney failure, unspecified: Secondary | ICD-10-CM | POA: Insufficient documentation

## 2023-10-18 DIAGNOSIS — I509 Heart failure, unspecified: Secondary | ICD-10-CM | POA: Diagnosis not present

## 2023-10-18 DIAGNOSIS — G9341 Metabolic encephalopathy: Secondary | ICD-10-CM | POA: Diagnosis not present

## 2023-10-18 DIAGNOSIS — I340 Nonrheumatic mitral (valve) insufficiency: Secondary | ICD-10-CM | POA: Insufficient documentation

## 2023-10-18 DIAGNOSIS — F1020 Alcohol dependence, uncomplicated: Secondary | ICD-10-CM

## 2023-10-18 DIAGNOSIS — F331 Major depressive disorder, recurrent, moderate: Secondary | ICD-10-CM | POA: Diagnosis not present

## 2023-10-18 DIAGNOSIS — I5082 Biventricular heart failure: Secondary | ICD-10-CM

## 2023-10-18 DIAGNOSIS — E871 Hypo-osmolality and hyponatremia: Secondary | ICD-10-CM | POA: Diagnosis not present

## 2023-10-18 LAB — COMPREHENSIVE METABOLIC PANEL WITH GFR
ALT: 479 U/L — ABNORMAL HIGH (ref 0–44)
AST: 678 U/L — ABNORMAL HIGH (ref 15–41)
Albumin: 2.9 g/dL — ABNORMAL LOW (ref 3.5–5.0)
Alkaline Phosphatase: 121 U/L (ref 38–126)
Anion gap: 12 (ref 5–15)
BUN: 27 mg/dL — ABNORMAL HIGH (ref 6–20)
CO2: 25 mmol/L (ref 22–32)
Calcium: 8.9 mg/dL (ref 8.9–10.3)
Chloride: 90 mmol/L — ABNORMAL LOW (ref 98–111)
Creatinine, Ser: 1.58 mg/dL — ABNORMAL HIGH (ref 0.61–1.24)
GFR, Estimated: 59 mL/min — ABNORMAL LOW (ref >60)
Glucose, Bld: 116 mg/dL — ABNORMAL HIGH (ref 70–99)
Potassium: 4.6 mmol/L (ref 3.5–5.1)
Sodium: 127 mmol/L — ABNORMAL LOW (ref 135–145)
Total Bilirubin: 2.6 mg/dL — ABNORMAL HIGH (ref 0.0–1.2)
Total Protein: 6.7 g/dL (ref 6.5–8.1)

## 2023-10-18 LAB — MISC LABCORP TEST (SEND OUT): Labcorp test code: 716910

## 2023-10-18 LAB — ANA W/REFLEX IF POSITIVE: Anti Nuclear Antibody (ANA): NEGATIVE

## 2023-10-18 LAB — COOXEMETRY PANEL
Carboxyhemoglobin: 0.3 % — ABNORMAL LOW (ref 0.5–1.5)
Methemoglobin: <0.7 % (ref 0.0–1.5)
O2 Saturation: 26.5 %
Total hemoglobin: 11.9 g/dL — ABNORMAL LOW (ref 12.0–16.0)

## 2023-10-18 LAB — GLUCOSE, CAPILLARY
Glucose-Capillary: 95 mg/dL (ref 70–99)
Glucose-Capillary: 113 mg/dL — ABNORMAL HIGH (ref 70–99)
Glucose-Capillary: 104 mg/dL — ABNORMAL HIGH (ref 70–99)
Glucose-Capillary: 139 mg/dL — ABNORMAL HIGH (ref 70–99)
Glucose-Capillary: 134 mg/dL — ABNORMAL HIGH (ref 70–99)

## 2023-10-18 LAB — PROTIME-INR
INR: 2 — ABNORMAL HIGH (ref 0.8–1.2)
Prothrombin Time: 22.6 s — ABNORMAL HIGH (ref 11.4–15.2)

## 2023-10-18 LAB — AMMONIA: Ammonia: 42 umol/L — ABNORMAL HIGH (ref 9–35)

## 2023-10-18 LAB — MAGNESIUM: Magnesium: 1.7 mg/dL (ref 1.7–2.4)

## 2023-10-18 MED ORDER — VITAMIN D 25 MCG (1000 UNIT) PO TABS
1000.0000 [IU] | ORAL_TABLET | Freq: Every day | ORAL | Status: DC
  Administered 2023-10-18 – 2023-10-21 (×4): 1000 [IU] via ORAL
  Filled 2023-10-18 (×4): qty 1

## 2023-10-18 MED ORDER — LACTULOSE 10 GM/15ML PO SOLN
20.0000 g | Freq: Two times a day (BID) | ORAL | Status: DC
  Administered 2023-10-18 – 2023-10-19 (×3): 20 g via ORAL
  Filled 2023-10-18 (×3): qty 30

## 2023-10-18 MED ORDER — HEPARIN SODIUM (PORCINE) 5000 UNIT/ML IJ SOLN
5000.0000 [IU] | Freq: Three times a day (TID) | INTRAMUSCULAR | Status: DC
  Administered 2023-10-18 – 2023-10-23 (×15): 5000 [IU] via SUBCUTANEOUS
  Filled 2023-10-18 (×15): qty 1

## 2023-10-18 MED ORDER — LACTULOSE ENEMA
300.0000 mL | Freq: Two times a day (BID) | ORAL | Status: DC
  Filled 2023-10-18 (×4): qty 300

## 2023-10-18 NOTE — TOC Initial Note (Signed)
 Transition of Care [facility]) - Initial/Assessment Note    Patient Details  Name: Bradley Cummings MRN: [id number] Date of Birth: 24-Jul-1990  Transition of Care [facility]) CM/SW Contact:    Juliane Och, LCSW Phone Number: 10/18/2023, 3:55 PM  Clinical Narrative:                   3:55 PM CSW introduced self and role to patient's father, Moshok (patient was asleep from medications). Moshok stated that patient resides with his parents and give adult siblings. Moshok confirmed him and other family could provide transportation/home support if needed upon discharge. Moshok declined patient history of HH/SNF/DME. Moshok inquired about Medicaid for patient. CSW consulted financial counseling for further assistance. Moshok confirmed patient does not have a PCP and expressed interest in a PCP on behalf of the patient. Per chart review, ED CSW and IP Psychiatry team provided substance use resources. CSW closed TOC consult (substance use counseling/education).  Expected Discharge Plan: Home/Self Care Barriers to Discharge: Continued Medical Work up   Patient Goals and CMS Choice            Expected Discharge Plan and Services In-house Referral: Clinical Social Work     Living arrangements for the past 2 months: Single Family Home                                      Prior Living Arrangements/Services Living arrangements for the past 2 months: Single Family Home Lives with:: Parents, Siblings Patient language and need for interpreter reviewed:: Yes          Care giver support system in place?: Yes (comment)   Criminal Activity/Legal Involvement Pertinent to Current Situation/Hospitalization: No - Comment as needed  Activities of Daily Living   ADL Screening (condition at time of admission) Independently performs ADLs?: No Does the patient have a NEW difficulty with bathing/dressing/toileting/self-feeding that is expected to last >3 days?: No Does the patient have a NEW difficulty  with getting in/out of bed, walking, or climbing stairs that is expected to last >3 days?: No Does the patient have a NEW difficulty with communication that is expected to last >3 days?: No Is the patient deaf or have difficulty hearing?: Yes Does the patient have difficulty seeing, even when wearing glasses/contacts?: Yes Does the patient have difficulty concentrating, remembering, or making decisions?: Yes  Permission Sought/Granted Permission sought to share information with : Family Supports Permission granted to share information with : No (Contact information on chart)  Share Information with NAME: Moshok Wanzer     Permission granted to share info w Relationship: Father  Permission granted to share info w Contact Information: [phone]  Emotional Assessment Appearance:: Appears stated age Attitude/Demeanor/Rapport: Lethargic Affect (typically observed): Calm   Alcohol / Substance Use: Not Applicable Psych Involvement: No (comment)  Admission diagnosis:  Alcohol abuse [F10.10] Hyponatremia [E87.1] Congestive heart failure, unspecified HF chronicity, unspecified heart failure type [facility]) [I50.9] Patient Active Problem List   Diagnosis Date Noted   Biventricular heart failure (HCC) 10/18/2023   Moderate mitral valve regurgitation 10/18/2023   Biatrial enlargement 10/18/2023   AKI (acute kidney injury) (HCC) 10/18/2023   Acute metabolic encephalopathy 10/18/2023   Major depressive disorder, recurrent episode, moderate (HCC) 10/17/2023   Hyponatremia 10/15/2023   AMS (altered mental status) 10/15/2023   Alcoholic hepatitis 10/15/2023   Headache due to trauma 03/19/2018   Traumatic subdural hematoma (HCC) 03/18/2018  SAH (subarachnoid hemorrhage) (HCC) 03/13/2018   Alcohol use disorder, moderate, dependence (HCC) 04/03/2016   Cannabis use disorder, mild, abuse 04/03/2016   PCP:  Patient, No Pcp Per Pharmacy:   [id number] - [place], [place] - [address] AT [facility] [address] [place] [address] Phone: [phone] Fax: [phone]     Social Drivers of Health (SDOH) Social History: SDOH Screenings   Food Insecurity: No Food Insecurity (10/15/2023)  Housing: Low Risk  (10/15/2023)  Transportation Needs: No Transportation Needs (10/15/2023)  Utilities: Not At Risk (10/15/2023)  Social Connections: Moderately Isolated (10/15/2023)  Tobacco Use: High Risk (10/15/2023)   SDOH Interventions:     Readmission Risk Interventions     No data to display
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
## 2023-10-18 NOTE — Plan of Care (Signed)
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
## 2023-10-18 NOTE — Plan of Care (Signed)
 Revisited patient this afternoon.  Multiple family members including father, uncle and cousin at bedside.  Updated patient and patient's family about his current medical condition and treatment plan.  Patient is more awake sitting up in bed eating lunch.  He is oriented to self, family, month and year but not place or date.  No complaints.  Very limited insight into why he is in the hospital.

## 2023-10-18 NOTE — Progress Notes (Signed)
 Patient ID: Bradley Cummings, male   DOB: 05/27/1991, 33 y.o.   MRN: [id number]    Progress Note   Subjective   Day # 3 CC; ETOH hepatitis, chronic ETOH abuse, severe hyponatremia  Nurse reports required regular doses of Ativan  overnight, and IV Ativan  this a.m., currently somnolent-high CIWA scores  EF 20 -25%  Prednisolone  started 5/17  Labs today-pro time 22.6/INR 2.0 Sodium 127/potassium 4.6/BUN 27/creatinine 1.58 T. bili 2.6/alk phos 121/AST 678/ALT 479 Ammonia 42 ANA negative/smooth muscle antibody positive at 37/IgG 1510/ceruloplasmin 35.9 Acute hepatitis panel negative Drug screen negative EtOH less than 15 on admission  Patient currently somnolent arouses to exam but will not open eyes or follow commands   Objective   Vital signs in last 24 hours: Temp:  [96.6 F (35.9 C)-97.8 F (36.6 C)] 97.1 F (36.2 C) (05/19 1114) Pulse Rate:  [0-106] 85 (05/19 1400) Resp:  [14-94] 34 (05/19 1400) BP: (72-104)/(54-80) 91/78 (05/19 1400) SpO2:  [71 %-97 %] 88 % (05/19 1400) Weight:  [72.1 kg] 72.1 kg (05/19 0500) Last BM Date : 10/17/23 General:   [name], somnolent, arouses to exam but nonverbal, brother at bedside Heart:  Regular rate and rhythm; no murmurs Lungs: Respirations even and unlabored, lungs CTA bilaterally Abdomen:  Soft, nontender and nondistended. Normal bowel sounds.  No appreciable ascites Extremities: 1-2+ edema lower extremities bilateral Neurologic: Somnolent   /Output from previous day: 05/18 0701 - 05/19 0700 In: 1280 [P.O.:1270; I.V.:10] Out: 775 [Urine:775] Intake/Output this shift: Total I/O In: 240 [P.O.:240] Out: -   Lab Results: Recent Labs    10/15/23 1722 10/16/23 0450 10/17/23 0400  WBC  --  4.9 3.7*  HGB 12.9* 10.5* 10.4*  HCT 38.0* 29.7* 30.1*  PLT  --  156 169   BMET Recent Labs    10/16/23 1635 10/16/23 2127 10/17/23 0400 10/17/23 0741 10/17/23 1114 10/17/23 1500 10/18/23 0458  NA 125*   < > 127*   127*   < > 128* 127* 127*  K 3.5  --  3.4*  --   --   --  4.6  CL 88*  --  89*  --   --   --  90*  CO2 28  --  28  --   --   --  25  GLUCOSE 228*  --  114*  --   --   --  116*  BUN 18  --  14  --   --   --  27*  CREATININE 1.00  --  0.95  --   --   --  1.58*  CALCIUM 8.3*  --  8.0*  --   --   --  8.9   < > = values in this interval not displayed.   LFT Recent Labs    10/17/23 0400 10/18/23 0458  PROT 5.8* 6.7  ALBUMIN 2.5* 2.9*  AST 891* 678*  ALT 485* 479*  ALKPHOS 100 121  BILITOT 3.3* 2.6*  BILIDIR 1.3*  --   IBILI 2.0*  --    PT/INR Recent Labs    10/17/23 0400 10/18/23 0458  LABPROT 25.4* 22.6*  INR 2.3* 2.0*        Assessment / Plan:    #79 33 year old African-American male with history of EtOH use disorder admitted 3 days ago with complaint of bilateral lower extremity swelling over the past couple of weeks, and on admission found to have acute kidney injury, severe hyponatremia, altered mental status and elevated LFTs concerning  for acute alcoholic hepatitis. 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 Tach  #4 EtOH withdrawal-active-on CIWA protocol #5 acute kidney injury-creatinine up today-had been receiving IV Lasix  twice daily, stopped today monitor carefully  Plan; continue aggressive supportive management per  CCM Continue prednisolone -will need [place] 3 score on Friday, then determine whether or not to continue prednisolone  Continue daily labs Continue lactulose  twice daily GI will continue to follow with you    Principal Problem:   Hyponatremia Active Problems:   Alcohol use disorder, moderate, dependence (HCC)   AMS (altered mental status)   Alcoholic hepatitis   Major depressive disorder, recurrent episode, moderate (HCC)   Biventricular heart failure (HCC)   Moderate mitral valve regurgitation   Biatrial enlargement   AKI (acute kidney injury) (HCC)   Acute metabolic encephalopathy     LOS: 3 days   Bradley Cummings EsterwoodPA-C  10/18/2023, 2:41 PM

## 2023-10-18 NOTE — Progress Notes (Signed)
   Patient Name: Bradley Cummings Date of Encounter: 10/18/2023 [facility] Cardiologist: None   Interval Summary  .    Resting fairly comfortably.  Soft blood pressures.  Brief nonsustained VT runs.  Vital Signs .    Vitals:   10/18/23 0700 10/18/23 0741 10/18/23 0742 10/18/23 0800  BP: (!) 77/63  (!) 85/63 (!) 81/62  Pulse: 88  86 86  Resp: (!) 38  17 (!) 33  Temp:  (!) 96.6 F (35.9 C)    TempSrc:  Axillary    SpO2: 93%  (!) 87% 94%  Weight:      Height:        Intake/Output Summary (Last 24 hours) at 10/18/2023 0838 Last data filed at 10/18/2023 0410 Gross per 24 hour  Intake 1270 ml  Output 775 ml  Net 495 ml      10/18/2023    5:00 AM 10/17/2023    2:19 AM 10/16/2023    4:34 AM  Last 3 Weights  Weight (lbs) 158 lb 15.2 oz 161 lb 6 oz 165 lb 5.5 oz  Weight (kg) 72.1 kg 73.2 kg 75 kg      Telemetry/ECG    As above- Personally Reviewed  Physical Exam .   GEN: No acute distress.   Neck: No JVD Cardiac: RRR, no murmurs, rubs, or gallops.  Respiratory: Clear to auscultation bilaterally. GI: Soft, nontender, non-distended  MS: No edema  Assessment & Plan .     33 year old with alcoholic hepatitis, alcoholic cardiomyopathy, EF 20%, subsequent hyponatremia  Chronic systolic heart failure/acute, alcoholic cardiomyopathy with EF 20% - Resting currently.  Added metoprolol  12.5 mg twice a day yesterday secondary to nonsustained VT seen on telemetry.  No room for increasing due to soft blood pressure -Reduced urine output yesterday, creatinine 1.58 this morning was 0.95 the day before.  We will stop IV Lasix  40 twice daily. -When able, start spironolactone 12.5 mg once a day.  Perhaps tomorrow. -When ready for discharge would consolidate metoprolol  to Toprol   25 mg once a day.  Alcoholic hepatitis - On prednisolone , Protonix .  Appreciate GI.  Alcohol abuse - On thiamine , Ativan  protocol.  Watch for withdrawal  No new changes today.  For questions or  updates, please contact [place] [facility] Please consult [email or web address] for contact info under        Signed, Dorothye Gathers, MD

## 2023-10-19 DIAGNOSIS — F1010 Alcohol abuse, uncomplicated: Secondary | ICD-10-CM | POA: Diagnosis not present

## 2023-10-19 DIAGNOSIS — N179 Acute kidney failure, unspecified: Secondary | ICD-10-CM | POA: Diagnosis not present

## 2023-10-19 DIAGNOSIS — E871 Hypo-osmolality and hyponatremia: Secondary | ICD-10-CM | POA: Diagnosis not present

## 2023-10-19 DIAGNOSIS — F1090 Alcohol use, unspecified, uncomplicated: Secondary | ICD-10-CM

## 2023-10-19 DIAGNOSIS — I509 Heart failure, unspecified: Secondary | ICD-10-CM | POA: Diagnosis not present

## 2023-10-19 DIAGNOSIS — G9341 Metabolic encephalopathy: Secondary | ICD-10-CM | POA: Diagnosis not present

## 2023-10-19 DIAGNOSIS — F331 Major depressive disorder, recurrent, moderate: Secondary | ICD-10-CM | POA: Diagnosis not present

## 2023-10-19 DIAGNOSIS — F1020 Alcohol dependence, uncomplicated: Secondary | ICD-10-CM | POA: Diagnosis not present

## 2023-10-19 DIAGNOSIS — K7011 Alcoholic hepatitis with ascites: Secondary | ICD-10-CM | POA: Diagnosis not present

## 2023-10-19 LAB — CBC
HCT: 33.1 % — ABNORMAL LOW (ref 39.0–52.0)
Hemoglobin: 11.3 g/dL — ABNORMAL LOW (ref 13.0–17.0)
MCH: 28.6 pg (ref 26.0–34.0)
MCHC: 34.1 g/dL (ref 30.0–36.0)
MCV: 83.8 fL (ref 80.0–100.0)
Platelets: 184 10*3/uL (ref 150–400)
RBC: 3.95 MIL/uL — ABNORMAL LOW (ref 4.22–5.81)
RDW: 20.8 % — ABNORMAL HIGH (ref 11.5–15.5)
WBC: 5.2 10*3/uL (ref 4.0–10.5)
nRBC: 1 % — ABNORMAL HIGH (ref 0.0–0.2)

## 2023-10-19 LAB — COMPREHENSIVE METABOLIC PANEL WITH GFR
ALT: 363 U/L — ABNORMAL HIGH (ref 0–44)
AST: 492 U/L — ABNORMAL HIGH (ref 15–41)
Albumin: 2.7 g/dL — ABNORMAL LOW (ref 3.5–5.0)
Alkaline Phosphatase: 116 U/L (ref 38–126)
Anion gap: 14 (ref 5–15)
BUN: 39 mg/dL — ABNORMAL HIGH (ref 6–20)
CO2: 21 mmol/L — ABNORMAL LOW (ref 22–32)
Calcium: 8.7 mg/dL — ABNORMAL LOW (ref 8.9–10.3)
Chloride: 91 mmol/L — ABNORMAL LOW (ref 98–111)
Creatinine, Ser: 1.98 mg/dL — ABNORMAL HIGH (ref 0.61–1.24)
GFR, Estimated: 45 mL/min — ABNORMAL LOW (ref >60)
Glucose, Bld: 101 mg/dL — ABNORMAL HIGH (ref 70–99)
Potassium: 5.4 mmol/L — ABNORMAL HIGH (ref 3.5–5.1)
Sodium: 126 mmol/L — ABNORMAL LOW (ref 135–145)
Total Bilirubin: 3.4 mg/dL — ABNORMAL HIGH (ref 0.0–1.2)
Total Protein: 6.3 g/dL — ABNORMAL LOW (ref 6.5–8.1)

## 2023-10-19 LAB — IRON AND TIBC
Iron: 31 ug/dL — ABNORMAL LOW (ref 45–182)
Saturation Ratios: 8 % — ABNORMAL LOW (ref 17.9–39.5)
TIBC: 384 ug/dL (ref 250–450)
UIBC: 353 ug/dL

## 2023-10-19 LAB — RETICULOCYTES
Immature Retic Fract: 21.1 % — ABNORMAL HIGH (ref 2.3–15.9)
RBC.: 3.95 MIL/uL — ABNORMAL LOW (ref 4.22–5.81)
Retic Count, Absolute: 99.1 10*3/uL (ref 19.0–186.0)
Retic Ct Pct: 2.5 % (ref 0.4–3.1)

## 2023-10-19 LAB — GLUCOSE, CAPILLARY
Glucose-Capillary: 116 mg/dL — ABNORMAL HIGH (ref 70–99)
Glucose-Capillary: 139 mg/dL — ABNORMAL HIGH (ref 70–99)
Glucose-Capillary: 141 mg/dL — ABNORMAL HIGH (ref 70–99)
Glucose-Capillary: 127 mg/dL — ABNORMAL HIGH (ref 70–99)

## 2023-10-19 LAB — AMMONIA: Ammonia: 46 umol/L — ABNORMAL HIGH (ref 9–35)

## 2023-10-19 LAB — PHOSPHORUS: Phosphorus: 5.5 mg/dL — ABNORMAL HIGH (ref 2.5–4.6)

## 2023-10-19 LAB — VITAMIN B6: Vitamin B6: 21.7 ug/L (ref 3.4–65.2)

## 2023-10-19 LAB — VITAMIN B12: Vitamin B-12: 3438 pg/mL — ABNORMAL HIGH (ref 180–914)

## 2023-10-19 LAB — PROTIME-INR
INR: 2.7 — ABNORMAL HIGH (ref 0.8–1.2)
Prothrombin Time: 28.5 s — ABNORMAL HIGH (ref 11.4–15.2)

## 2023-10-19 LAB — FERRITIN: Ferritin: 103 ng/mL (ref 24–336)

## 2023-10-19 LAB — MAGNESIUM: Magnesium: 1.9 mg/dL (ref 1.7–2.4)

## 2023-10-19 LAB — ZINC: Zinc: 60 ug/dL (ref 44–115)

## 2023-10-19 LAB — FOLATE: Folate: 26.7 ng/mL (ref >5.9)

## 2023-10-19 MED ORDER — LACTULOSE 10 GM/15ML PO SOLN
20.0000 g | Freq: Every day | ORAL | Status: DC
  Filled 2023-10-19: qty 30

## 2023-10-19 MED ORDER — LACTULOSE ENEMA
300.0000 mL | Freq: Every day | ORAL | Status: DC
  Filled 2023-10-19: qty 300

## 2023-10-19 MED ORDER — VITAMIN K1 10 MG/ML IJ SOLN
5.0000 mg | Freq: Every day | INTRAVENOUS | Status: DC
  Administered 2023-10-19 – 2023-10-20 (×2): 5 mg via INTRAVENOUS
  Filled 2023-10-19 (×4): qty 0.5

## 2023-10-19 MED ORDER — SODIUM ZIRCONIUM CYCLOSILICATE 10 G PO PACK
10.0000 g | PACK | Freq: Once | ORAL | Status: AC
  Administered 2023-10-19: 10 g via ORAL
  Filled 2023-10-19: qty 1

## 2023-10-19 NOTE — Progress Notes (Signed)
   Patient Name: Bradley Cummings Date of Encounter: 10/19/2023 [facility] Cardiologist: None   Interval Summary  .    Recent updates to multiple family members father, uncle, cousin.  Maintains very limited insight as to why he is in the hospital.  Vital Signs .    Vitals:   10/19/23 0430 10/19/23 0500 10/19/23 0600 10/19/23 0819  BP: 92/74 (!) 87/76 (!) 85/60   Pulse:  79 77   Resp:  (!) 43 (!) 44   Temp:    98.5 F (36.9 C)  TempSrc:    Axillary  SpO2:  96% 96%   Weight:      Height:        Intake/Output Summary (Last 24 hours) at 10/19/2023 9604 Last data filed at 10/18/2023 2136 Gross per 24 hour  Intake 260 ml  Output --  Net 260 ml      10/18/2023    5:00 AM 10/17/2023    2:19 AM 10/16/2023    4:34 AM  Last 3 Weights  Weight (lbs) 158 lb 15.2 oz 161 lb 6 oz 165 lb 5.5 oz  Weight (kg) 72.1 kg 73.2 kg 75 kg      Telemetry/ECG    This rhythm/sinus tachycardia, prior brief episodes of nonsustained VT.- Personally Reviewed  Physical Exam .   GEN: No acute distress.   Neck: No JVD Cardiac: RRR, no murmurs, rubs, or gallops.  Respiratory: Clear to auscultation bilaterally. GI: Soft, nontender, non-distended  MS: No edema  Assessment & Plan .     33 year old with alcoholic cardiomyopathy EF 20% with alcoholic hepatitis  -On watch for alcohol withdrawal, CIWA protocol, Ativan . -Hypotension does not allow for goal-directed medical therapy such as spironolactone, Entresto, Farxiga -Continue with low-dose metoprolol  as tolerated.  Rationale for this was occasional runs of nonsustained V. tach, a common finding when ejection fractions are less than 30%. -Also avoiding spironolactone at this point, potassium is currently 5.4. -Stopped IV Lasix  5/19 secondary to rise in creatinine, concomitant hypotension as well as aggressive diuresis. -GI notes reviewed.  AST greater than ALT, alcoholic hepatitis.  Decreasing values.  Steroid.   -Psychiatry note reviewed as  well.  For questions or updates, please contact [place] [facility] Please consult [email or web address] for contact info under        Signed, Dorothye Gathers, MD

## 2023-10-19 NOTE — Consult Note (Signed)
 [facility] Psychiatric Consult Follow up  Patient Name: .Bradley Cummings  MRN: [id number]  DOB: 10-Sep-1990  Consult Order details:  Orders (From admission, onward)     Start     Ordered   10/17/23 1043  IP CONSULT TO PSYCHIATRY       Comments: Not urgent, Can see 5/19  Ordering Provider: Delories Fetter, NP  Provider:  (Not yet assigned)  Question Answer Comment  Location [facility]   Reason for Consult? etoh abuse // resources      10/17/23 1043             Mode of Visit: In person    Psychiatry Consult Evaluation  Service Date: Oct 19, 2023 LOS:  LOS: 4 days  Chief Complaint "I want to get out of here and get my life together."  Primary Psychiatric Diagnoses  MDD, moderate 2.  Alcohol use disorder  Assessment  Bradley Cummings is a 33 y.o. male admitted: Medicallyfor 10/15/2023 11:57 AM for BLE edema and DOE. He carries the psychiatric diagnoses of alcohol use disorder and and has a past medical history of depression and alcohol use disorder.   His current presentation of dysphoric mood, self-medicating, low energy, feelings of worthlessness is most consistent with MDD. He meets criteria for MDD and alcohol use d/o based on the DSM V-TR.  Current outpatient psychotropic medications include none. On initial examination, patient appeared ill-looking with some difficulty expanding on answers related to O2 and increase in heart rate when talking. Please see plan below for detailed recommendations.   10/18/2023 Patient received oral Ativan  at 4 AM and IV Ativan  at 6 AM today.  Patient was sleeping when I came to speak with him.  The psychiatric team was consulted to aid with substance use resources in which our team placed the contact information for substance use resources in his discharge paperwork.  Patient is still medically unstable at this time but when discharged, we recommend patient following up with psychiatric outpatient to continue managing his alcohol use  disorder and MDD.  10/19/2023 Patient is more alert and oriented today, knowing his name, the year, and his situation.  He did not know the month nor where he is at.  He does report a long history of alcohol use that has increased recently along with social isolation and the stressor of not having a job.  Patient seems to be coping with his depressive symptoms with alcohol which then negatively affects his mentation.  Patient is still fairly medically unstable and is still in the ICU.  He is interested in CD IOP and when more medically stable we can send a referral for an initial assessment.  We will continue his Lexapro  at this time as he is tolerating it.  Diagnoses:  Active Hospital problems: Principal Problem:   Hyponatremia Active Problems:   Alcohol use disorder, moderate, dependence (HCC)   AMS (altered mental status)   Alcoholic hepatitis   Major depressive disorder, recurrent episode, moderate (HCC)   Congestive heart failure (HCC)   Moderate mitral valve regurgitation   Biatrial enlargement   AKI (acute kidney injury) (HCC)   Acute metabolic encephalopathy    Plan   ## Psychiatric Medication Recommendations:  -Continue Lexapro  5 mg daily -[facility] resources provided in discharge paperwork  ## Medical Decision Making Capacity: Not specifically addressed in this encounter  ## Further Work-up:  -- most recent EKG on 10/15/2023 had QtC of 444 -- Pertinent labwork reviewed earlier this admission includes:  CBC, chem panel, toxicology, urine, and EKG   ## Disposition:-- There are no psychiatric contraindications to discharge at this time  ## Behavioral / Environmental: - No specific recommendations at this time.     ## Safety and Observation Level:  - Based on my clinical evaluation, I estimate the patient to be at low risk of self harm in the current setting. - At this time, we recommend  routine. This decision is based on my review of the chart including patient's history and  current presentation, interview of the patient, mental status examination, and consideration of suicide risk including evaluating suicidal ideation, plan, intent, suicidal or self-harm behaviors, risk factors, and protective factors. This judgment is based on our ability to directly address suicide risk, implement suicide prevention strategies, and develop a safety plan while the patient is in the clinical setting. Please contact our team if there is a concern that risk level has changed.  CSSR Risk Category:C-SSRS RISK CATEGORY: No Risk  Suicide Risk Assessment: Patient has following modifiable risk factors for suicide: untreated depression, which we are addressing by starting Lexapro . Patient has following non-modifiable or demographic risk factors for suicide: male gender and psychiatric hospitalization Patient has the following protective factors against suicide: Supportive family, Frustration tolerance, no history of suicide attempts, and no history of NSSIB  Thank you for this consult request. Recommendations have been communicated to the primary team.  We will continue to follow at this time.   Bradley Nares, MD       History of Present Illness  Relevant Aspects of [facility] Course:  Admitted on 10/15/2023 for BLE edema and DOE, consult for alcohol use concerns.  Patient Report:  Patient sitting in bed eating breakfast, no acute distress.  Patient's brother is in the room and patient is amenable for brother to stay in the room during assessment.  He reports feeling okay today.  He states that he initially came to the hospital due to bilateral lower extremity edema and chest pain.  He denies adverse effects from starting Lexapro .  He does admit to drinking alcohol since 2015.  He states that he previously had a period of sobriety for 6 months in the past and he wishes to stop drinking alcohol in the future.  He states that he does not have many friends and to cope with his low mood,  he drinks alcohol.  He is also motivated to find a job.  He stopped working 4 months ago and he previously was working at [employer].  He currently lives with his brother, sister, and mother.  He currently denies SI, HI, AVH.  He states that he sometimes have thoughts of passive suicide ideation, stating "sometimes I do not want to live".  He states what motivates him to keep living is his family and discovering other cultures.  I explained to him an option of CD IOP and he is amenable to looking into it when he is medically stable.  Psych ROS:  Depression: moderate Anxiety:  moderate Mania (lifetime and current): denied Psychosis: (lifetime and current): past history of paranoia  Review of Systems  Cardiovascular:  Positive for leg swelling. Negative for chest pain.  Gastrointestinal:  Negative for nausea.  Neurological:  Negative for tremors and headaches.  Psychiatric/Behavioral:  Positive for depression and substance abuse.      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 Living Situation: lives with his parents who are supportive  Access to weapons/lethal means: none   Substance History Alcohol: daily beer, reported only two 24 oz, high alcohol content  Type of alcohol beer Last Drink prior to admission Tobacco: 1/2 ppd of cigarettes  Exam Findings  Vital Signs:  Temp:  [96.1  F (35.6 C)-98.5 F (36.9 C)] 98.5 F (36.9 C) (05/20 0819) Pulse Rate:  [75-92] 81 (05/20 0943) Resp:  [14-80] 30 (05/20 0900) BP: (73-94)/(58-78) 94/70 (05/20 0943) SpO2:  [87 %-100 %] 98 % (05/20 0900) Blood pressure 94/70, pulse 81, temperature 98.5 F (36.9 C), temperature source Axillary, resp. rate (!) 30, height 5\' 10"  (1.778 m), weight 72.1 kg, SpO2 98%. Body mass index is 22.81 kg/m.  Physical Exam Vitals reviewed.  HENT:     Head: Atraumatic.  Cardiovascular:     Rate and Rhythm: Normal rate.  Pulmonary:     Effort: Tachypnea present.  Neurological:     General: No focal deficit present.     Mental Status Exam: General Appearance: Casual  Orientation:  Full (Time, Place, and Person)  Memory:  Immediate;   Fair Recent;   Fair Remote;   Fair  Concentration:  Concentration: Fair and Attention Span: Fair  Recall:  Fair  Attention  Fair  Eye Contact:  Fair  Speech:  Clear and Coherent  Language:  Fair  Volume:  Normal  Mood: depressed  Affect:  Appropriate  Thought Process:  Coherent  Thought Content:  Logical  Suicidal Thoughts:  No  Homicidal Thoughts:  No  Judgement:  Fair  Insight:  Fair  Psychomotor Activity:  Decreased  Akathisia:  No  Fund of Knowledge:  Good      Assets:  Desire for Improvement Housing Leisure Time Resilience Social Support  Cognition:  WNL  ADL's:  Impaired  AIMS (if indicated):        Other History   These have been pulled in through the EMR, reviewed, and updated if appropriate.  Family History:  The patient's family history is not on file.  Medical History: Past Medical History:  Diagnosis Date   Acute alcoholic hepatitis 10/15/2023   Alcohol withdrawal (HCC)    Eczema    ETOH abuse    HFrEF (heart failure with reduced ejection fraction) (HCC)    Hyponatremia with excess extracellular fluid volume 10/15/2023   MDD (major depressive disorder)    Severe alcohol dependence (HCC)    Tobacco abuse    Traumatic  subdural hematoma (SDH) (HCC) 2019   Fall    Surgical History: History reviewed. No pertinent surgical history.   Medications:   Current Facility-Administered Medications:    albuterol  (PROVENTIL ) (2.5 MG/3ML) 0.083% nebulizer solution 2.5 mg, 2.5 mg, Nebulization, Q2H PRN, Elgergawy, Dawood S, MD   artificial tears (LACRILUBE) ophthalmic ointment, , Both Eyes, Q4H PRN, Mannam, Praveen, MD   Chlorhexidine  Gluconate Cloth 2 % PADS 6 each, 6 each, Topical, Daily, Mannam, Praveen, MD, 6 each at 10/18/23 1258   cholecalciferol  (VITAMIN D3) 25 MCG (1000 UNIT) tablet 1,000 Units, 1,000 Units, Oral, Daily,  Gonfa, Taye T, MD, 1,000 Units at 10/19/23 9147   docusate sodium  (COLACE) capsule 100 mg, 100 mg, Oral, BID PRN, Desai, Rahul P, PA-C   escitalopram  (LEXAPRO ) tablet 5 mg, 5 mg, Oral, Daily, Lord, Jamison Y, NP, 5 mg at 10/19/23 0836   feeding supplement (ENSURE ENLIVE / ENSURE PLUS) liquid 237 mL, 237 mL, Oral, BID BM, Mannam, Praveen, MD, 237 mL at 10/17/23 1503   folic acid  (FOLVITE ) tablet 1 mg, 1 mg, Oral, Daily, Elgergawy, Dawood S, MD, 1 mg at 10/19/23 0839   heparin  injection 5,000 Units, 5,000 Units, Subcutaneous, Q8H, Gonfa, Taye T, MD, 5,000 Units at 10/19/23 8295   insulin  aspart (novoLOG ) injection 0-9 Units, 0-9 Units, Subcutaneous, TID WC, Mannam, Praveen, MD, 1 Units at 10/18/23 1544   lactulose  (CHRONULAC ) 10 GM/15ML solution 20 g, 20 g, Oral, BID, 20 g at 10/19/23 0945 **OR** lactulose  (CHRONULAC ) enema 200 gm, 300 mL, Rectal, BID, Gonfa, Taye T, MD   LORazepam  (ATIVAN ) tablet 1-4 mg, 1-4 mg, Oral, Q1H PRN, 2 mg at 10/18/23 2134 **OR** LORazepam  (ATIVAN ) injection 1-4 mg, 1-4 mg, Intravenous, Q1H PRN, Mannam, Praveen, MD, 2 mg at 10/19/23 6213   metoprolol  tartrate (LOPRESSOR ) tablet 12.5 mg, 12.5 mg, Oral, BID, Gonfa, Taye T, MD, 12.5 mg at 10/19/23 0943   multivitamin with minerals tablet 1 tablet, 1 tablet, Oral, Daily, Elgergawy, Dawood S, MD, 1 tablet at 10/19/23 0865    nicotine  (NICODERM CQ  - dosed in mg/24 hours) patch 21 mg, 21 mg, Transdermal, Daily, Elgergawy, Ardia Kraft, MD, 21 mg at 10/19/23 7846   Oral care mouth rinse, 15 mL, Mouth Rinse, PRN, Mannam, Praveen, MD   pantoprazole  (PROTONIX ) EC tablet 40 mg, 40 mg, Oral, Daily, Bowser, Grace E, NP, 40 mg at 10/19/23 0841   prednisoLONE  tablet 40 mg, 40 mg, Oral, Daily, Bowser, Grace E, NP, 40 mg at 10/19/23 0940   sodium chloride  flush (NS) 0.9 % injection 10-40 mL, 10-40 mL, Intracatheter, Q12H, Mannam, Praveen, MD, 10 mL at 10/19/23 0845   sodium chloride  flush (NS) 0.9 % injection 10-40 mL, 10-40 mL, Intracatheter, PRN, Mannam, Praveen, MD   sodium zirconium cyclosilicate (LOKELMA) packet 10 g, 10 g, Oral, Once, Gonfa, Taye T, MD   thiamine  (VITAMIN B1) 500 mg in sodium chloride  0.9 % 50 mL IVPB, 500 mg, Intravenous, Once, Elgergawy, Ardia Kraft, MD   thiamine  (VITAMIN B1) tablet 100 mg, 100 mg, Oral, Daily, 100 mg at 10/19/23 0840 **OR** [DISCONTINUED] thiamine  (VITAMIN B1) injection 100 mg, 100 mg, Intravenous, Daily, Oralee Billow I, RPH  Allergies: No Known Allergies  Bradley Nares, MD

## 2023-10-19 NOTE — Evaluation (Signed)
 Physical Therapy Evaluation Patient Details Name: Bradley Cummings MRN: [id number] DOB: [birth year] Today's Date: 10/19/2023  History of Present Illness  33 yo male admitted 10/15/23 with AMS, severe hyponatremia, volume overload and edema. PMHx: ETOH abuse, SAH  Clinical Impression  Pt with flat affect, decreased awareness and memory with balance deficits noted. Pt normally independent, living with family and caring for himself. Pt requiring CGA to min assist for balance with increased balance deficits standing at sink with OT after gait. Pt requires supervision for safety and cognition at present and would benefit from OPPT to maximize gait and balance. Will follow acutely to increase independence and safety, encouraged OOB daily with staff assist.   HR 80 SPO2 99% on RA BP supine 92/73 (85) Sitting 99/71 (80)        If plan is discharge home, recommend the following: Supervision due to cognitive status;Assistance with cooking/housework;Assist for transportation;Direct supervision/assist for medications management   Can travel by private vehicle        Equipment Recommendations None recommended by PT  Recommendations for Other Services       Functional Status Assessment Patient has had a recent decline in their functional status and demonstrates the ability to make significant improvements in function in a reasonable and predictable amount of time.     Precautions / Restrictions Precautions Precautions: Fall      Mobility  Bed Mobility Overal bed mobility: Needs Assistance Bed Mobility: Supine to Sit     Supine to sit: Supervision, HOB elevated     General bed mobility comments: HOB 25 degrees with assist for lines and safety    Transfers Overall transfer level: Needs assistance   Transfers: Sit to/from Stand Sit to Stand: Contact guard assist           General transfer comment: CGA for safety, cues to back to chair, assist for lines     Ambulation/Gait Ambulation/Gait assistance: Contact guard assist Gait Distance (Feet): 150 Feet Assistive device: None Gait Pattern/deviations: Step-through pattern, Decreased stride length, Narrow base of support   Gait velocity interpretation: <1.8 ft/sec, indicate of risk for recurrent falls   General Gait Details: pt with narrow BOS and short strides, improved strides with verbal cues, cues for direction with CGA for balance with slight unsteadiness  Stairs            Wheelchair Mobility     Tilt Bed    Modified Rankin (Stroke Patients Only)       Balance Overall balance assessment: Needs assistance Sitting-balance support: No upper extremity supported Sitting balance-Leahy Scale: Fair       Standing balance-Leahy Scale: Good Standing balance comment: good with decline to poor standing at sink                             Pertinent Vitals/Pain Pain Assessment Pain Assessment: No/denies pain    Home Living Family/patient expects to be discharged to:: Private residence Living Arrangements: Parent;Other relatives Available Help at Discharge: Family;Available 24 hours/day Type of Home: House Home Access: Stairs to enter   [employer] of Steps: 1   Home Layout: One level Home Equipment: None Additional Comments: family works but varied shifts so it sounds like someone is home throughout the day    Prior Function Prior Level of Function : Independent/Modified Independent                     Extremity/Trunk Assessment  Upper Extremity Assessment Upper Extremity Assessment: Defer to OT evaluation    Lower Extremity Assessment Lower Extremity Assessment: Overall WFL for tasks assessed    Cervical / Trunk Assessment Cervical / Trunk Assessment: Normal  Communication   Communication Communication: No apparent difficulties    Cognition Arousal: Alert Behavior During Therapy: Flat affect   PT - Cognitive  impairments: Orientation, Safety/Judgement, Problem solving   Orientation impairments: Situation                     Following commands: Impaired Following commands impaired: Follows one step commands with increased time     Cueing Cueing Techniques: Verbal cues     General Comments      Exercises     Assessment/Plan    PT Assessment Patient needs continued PT services  PT Problem List Decreased coordination;Decreased activity tolerance;Decreased balance;Decreased mobility;Decreased safety awareness;Decreased cognition       PT Treatment Interventions DME instruction;Gait training;Functional mobility training;Therapeutic activities;Therapeutic exercise;Patient/family education;Cognitive remediation;Neuromuscular re-education;Balance training    PT Goals (Current goals can be found in the Care Plan section)  Acute Rehab PT Goals Patient Stated Goal: make music PT Goal Formulation: With patient Time For Goal Achievement: 11/02/23 Potential to Achieve Goals: Good    Frequency Min 2X/week     Co-evaluation PT/OT/SLP Co-Evaluation/Treatment: Yes Reason for Co-Treatment: Necessary to address cognition/behavior during functional activity;For patient/therapist safety (based on RN assessment, not needed for future sessions) PT goals addressed during session: Mobility/safety with mobility;Balance         AM-PAC PT "6 Clicks" Mobility  Outcome Measure Help needed turning from your back to your side while in a flat bed without using bedrails?: A Little Help needed moving from lying on your back to sitting on the side of a flat bed without using bedrails?: A Little Help needed moving to and from a bed to a chair (including a wheelchair)?: A Little Help needed standing up from a chair using your arms (e.g., wheelchair or bedside chair)?: A Little Help needed to walk in hospital room?: A Little Help needed climbing 3-5 steps with a railing? : A Lot 6 Click Score: 17     End of Session Equipment Utilized During Treatment: Gait belt Activity Tolerance: Patient tolerated treatment well Patient left: in chair;with call bell/phone within reach;with chair alarm set Nurse Communication: Mobility status PT Visit Diagnosis: Other abnormalities of gait and mobility (R26.89);Difficulty in walking, not elsewhere classified (R26.2)    Time: 0911-0930 PT Time Calculation (min) (ACUTE ONLY): 19 min   Charges:   PT Evaluation $PT Eval Low Complexity: 1 Low   PT General Charges $$ ACUTE PT VISIT: 1 Visit         Annis Baseman, PT Acute Rehabilitation Services Office: [phone]   Trevyn Lumpkin B Takeem Krotzer 10/19/2023, 10:10 AM
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
## 2023-10-19 NOTE — Evaluation (Signed)
 Occupational Therapy Evaluation Patient Details Name: Bradley Cummings MRN: [id number] DOB: Jul 05, 1990 Today's Date: 10/19/2023   History of Present Illness   33 yo male admitted 10/15/23 with AMS, severe hyponatremia, volume overload and edema. PMHx: ETOH abuse, SAH     Clinical Impressions Pt reports ind at baseline with ADLs/functional mobility, lives with family. Pt currently needing min-mod A for ADLs, supervision for bed mobility, and CGA for transfers without AD. Pt standing at sink for grooming task, with x1 LOB episode, needed up to mod A to correct. Pt presenting with impairments listed below, will follow acutely. Recommend OP OT at d/c given pt has 24/7 support at home.     If plan is discharge home, recommend the following:   A little help with walking and/or transfers;A little help with bathing/dressing/bathroom;Assistance with cooking/housework;Direct supervision/assist for medications management;Direct supervision/assist for financial management;Help with stairs or ramp for entrance;Assist for transportation;Supervision due to cognitive status     Functional Status Assessment   Patient has had a recent decline in their functional status and demonstrates the ability to make significant improvements in function in a reasonable and predictable amount of time.     Equipment Recommendations   BSC/3in1;Tub/shower seat     Recommendations for Other Services   PT consult     Precautions/Restrictions   Precautions Precautions: Fall Restrictions Weight Bearing Restrictions Per Provider Order: No     Mobility Bed Mobility Overal bed mobility: Needs Assistance Bed Mobility: Supine to Sit     Supine to sit: Supervision, HOB elevated          Transfers Overall transfer level: Needs assistance   Transfers: Sit to/from Stand Sit to Stand: Contact guard assist                  Balance Overall balance assessment: Needs assistance Sitting-balance  support: No upper extremity supported Sitting balance-Leahy Scale: Good       Standing balance-Leahy Scale: Fair Standing balance comment: LOB x1 standing at sink needing, mod A to correct, overall min A without AD                           ADL either performed or assessed with clinical judgement   ADL Overall ADL's : Needs assistance/impaired Eating/Feeding: Set up;Sitting   Grooming: Minimal assistance;Sitting   Upper Body Bathing: Sitting;Minimal assistance   Lower Body Bathing: Minimal assistance;Sitting/lateral leans;Sit to/from stand   Upper Body Dressing : Minimal assistance;Sitting   Lower Body Dressing: Moderate assistance;Sitting/lateral leans;Sit to/from stand   Toilet Transfer: Minimal assistance;Ambulation;Regular Teacher, adult education Details (indicate cue type and reason): simulated via functional mobility Toileting- Clothing Manipulation and Hygiene: Minimal assistance       Functional mobility during ADLs: Minimal assistance;Rolling walker (2 wheels)       Vision   Additional Comments: will further assess, at times needs incr time to locate items in room/room #     Perception Perception: Not tested       Praxis Praxis: Not tested       Pertinent Vitals/Pain Pain Assessment Pain Assessment: No/denies pain     Extremity/Trunk Assessment Upper Extremity Assessment Upper Extremity Assessment: Generalized weakness (decr FMC noted when self feeding)   Lower Extremity Assessment Lower Extremity Assessment: Defer to PT evaluation   Cervical / Trunk Assessment Cervical / Trunk Assessment: Normal   Communication Communication Communication: No apparent difficulties   Cognition Arousal: Alert Behavior During Therapy: Flat affect Cognition: No family/caregiver  present to determine baseline, Cognition impaired   Orientation impairments: Place, Situation Awareness: Online awareness impaired     Executive functioning impairment  (select all impairments): Initiation, Problem solving OT - Cognition Comments: pt with slowed processing, ~delayed responses, overall flat affect, disoriented to place/situation, per chart review, recieved ativan  2x this AM                 Following commands: Impaired Following commands impaired: Follows one step commands with increased time     Cueing  General Comments   Cueing Techniques: Verbal cues  VSS   Exercises     Shoulder Instructions      Home Living Family/patient expects to be discharged to:: Private residence Living Arrangements: Parent;Other relatives Available Help at Discharge: Family;Available 24 hours/day Type of Home: House Home Access: Stairs to enter [employer] of Steps: 1   Home Layout: One level     Bathroom Shower/Tub: Chief Strategy Officer: Standard     Home Equipment: None   Additional Comments: family works but varied shifts so it sounds like someone is home throughout the day      Prior Functioning/Environment Prior Level of Function : Independent/Modified Independent                    OT Problem List: Decreased strength;Decreased range of motion;Decreased activity tolerance;Impaired balance (sitting and/or standing);Decreased cognition;Decreased safety awareness   OT Treatment/Interventions: Self-care/ADL training;Therapeutic exercise;DME and/or AE instruction;Energy conservation;Therapeutic activities;Balance training;Patient/family education      OT Goals(Current goals can be found in the care plan section)   Acute Rehab OT Goals Patient Stated Goal: none stated OT Goal Formulation: With patient Time For Goal Achievement: 11/02/23 Potential to Achieve Goals: Good ADL Goals Pt Will Perform Upper Body Dressing: with modified independence;sitting Pt Will Perform Lower Body Dressing: with modified independence;sit to/from stand;sitting/lateral leans Pt Will Transfer to Toilet: with modified  independence;ambulating;regular height toilet Pt Will Perform Tub/Shower Transfer: Tub transfer;Shower transfer;with supervision;ambulating Additional ADL Goal #1: pt will receive passing score on pillbox assessment in prep for IADLs   OT Frequency:  Min 2X/week    Co-evaluation PT/OT/SLP Co-Evaluation/Treatment: Yes Reason for Co-Treatment: Necessary to address cognition/behavior during functional activity;For patient/therapist safety PT goals addressed during session: Mobility/safety with mobility;Balance OT goals addressed during session: ADL's and self-care;Strengthening/ROM      AM-PAC OT "6 Clicks" Daily Activity     Outcome Measure Help from another person eating meals?: A Little Help from another person taking care of personal grooming?: A Little Help from another person toileting, which includes using toliet, bedpan, or urinal?: A Little Help from another person bathing (including washing, rinsing, drying)?: A Lot Help from another person to put on and taking off regular upper body clothing?: A Little Help from another person to put on and taking off regular lower body clothing?: A Lot 6 Click Score: 16   End of Session Equipment Utilized During Treatment: Gait belt Nurse Communication: Mobility status  Activity Tolerance: Patient tolerated treatment well Patient left: in chair;with call bell/phone within reach;with chair alarm set  OT Visit Diagnosis: Unsteadiness on feet (R26.81);Other abnormalities of gait and mobility (R26.89);Muscle weakness (generalized) (M62.81);Other symptoms and signs involving cognitive function                Time: 0910-0930 OT Time Calculation (min): 20 min Charges:  OT General Charges $OT Visit: 1 Visit OT Evaluation $OT Eval Moderate Complexity: 1 Mod  Bradley Cummings K, OTD, OTR/L SecureChat Preferred  Acute Rehab [phone]   Antionette Kirks 10/19/2023, 12:13 PM

## 2023-10-19 NOTE — Progress Notes (Signed)
 Patient ID: Bradley Cummings, male   DOB: 08-09-1990, 33 y.o.   MRN: [id number]    Progress Note   Subjective   Day 3 4 CC; acute alcoholic hepatitis, chronic EtOH abuse, severe hyponatremia, EtOH induced cardiomyopathy /severe  Prednisolone  initiated 10/16/2023 On CIWA protocol  Labs today-pro time 28.5/INR 2.7 Sodium 126/potassium 5.4/BUN 39/creatinine 1.98 T. bili 3.4/alk phos 116/AST 492/ALT 363 Ammonia 46 Serum iron 31/TIBC 364/sat 8/ferritin/103  Patient is up in chair, somnolent, did not arouse to voice or exam no family present    Objective   Vital signs in last 24 hours: Temp:  [96.1 F (35.6 C)-98.5 F (36.9 C)] 98.5 F (36.9 C) (05/20 0819) Pulse Rate:  [75-92] 81 (05/20 1000) Resp:  [14-80] 25 (05/20 1000) BP: (73-94)/(58-78) 91/73 (05/20 1000) SpO2:  [87 %-100 %] 98 % (05/20 1000) Weight:  [55.1 kg] 55.1 kg (05/20 0729) Last BM Date : 10/19/23 General:    [name] in NAD somnolent, unable to converse Heart:  Regular rate and rhythm; no murmurs Lungs: Respirations even and unlabored, lungs CTA bilaterally Abdomen:  Soft, nontender and nondistended. Normal bowel sounds. Extremities:  trace  edema. Neurologic: Somnolent Psych: Somnolent  Intake/Output from previous day: 05/19 0701 - 05/20 0700 In: 260 [P.O.:240; I.V.:20] Out: -  Intake/Output this shift: Total I/O In: 400 [P.O.:400] Out: -   Lab Results: Recent Labs    10/17/23 0400 10/19/23 0512  WBC 3.7* 5.2  HGB 10.4* 11.3*  HCT 30.1* 33.1*  PLT 169 184   BMET Recent Labs    10/17/23 0400 10/17/23 0741 10/17/23 1500 10/18/23 0458 10/19/23 0512  NA 127*  127*   < > 127* 127* 126*  K 3.4*  --   --  4.6 5.4*  CL 89*  --   --  90* 91*  CO2 28  --   --  25 21*  GLUCOSE 114*  --   --  116* 101*  BUN 14  --   --  27* 39*  CREATININE 0.95  --   --  1.58* 1.98*  CALCIUM 8.0*  --   --  8.9 8.7*   < > = values in this interval not displayed.   LFT Recent Labs     10/17/23 0400 10/18/23 0458 10/19/23 0512  PROT 5.8*   < > 6.3*  ALBUMIN 2.5*   < > 2.7*  AST 891*   < > 492*  ALT 485*   < > 363*  ALKPHOS 100   < > 116  BILITOT 3.3*   < > 3.4*  BILIDIR 1.3*  --   --   IBILI 2.0*  --   --    < > = values in this interval not displayed.   PT/INR Recent Labs    10/18/23 0458 10/19/23 0512  LABPROT 22.6* 28.5*  INR 2.0* 2.7*       Assessment / Plan:    #38 33 year old male with history of EtOH abuse admitted with complaints of bilateral lower extremity swelling over the previous couple of weeks.  On admission found to have acute kidney injury, severe hyponatremia, altered mental status and elevated LFTs concerning for acute alcoholic hepatitis Discriminant function score on 10/16/2023 was 61-prednisolone  started  Ultrasound shows moderate perihepatic ascites, not enough fluid for paracentesis, hepatic steatosis no definite cirrhosis on ultrasound  Autoimmune markers negative other than positive smooth muscle antibody/F-actin  #2 coagulopathy-INR on the rise, will start vitamin K #3 severe hyponatremia improved #4 severe cardiomyopathy-echo this  admission no LV thrombus EF less than 20% severe global hypokinesis Cardiology following  #5 EtOH withdrawal active continues on CIWA protocol #6 acute kidney injury creatinine continues gradual rise IV Lasix  stopped yesterday  Plan; continue aggressive supportive management Start vitamin K x 3 days Continue prednisolone -will reassess Friday Continue CIWA protocol Continue lactulose  Patient critically ill, guarded prognosis   Principal Problem:   Hyponatremia Active Problems:   Alcohol use disorder, moderate, dependence (HCC)   AMS (altered mental status)   Alcoholic hepatitis   Major depressive disorder, recurrent episode, moderate (HCC)   Congestive heart failure (HCC)   Moderate mitral valve regurgitation   Biatrial enlargement   AKI (acute kidney injury) (HCC)   Acute metabolic  encephalopathy     LOS: 4 days   Copeland Lapier EsterwoodPA-C  10/19/2023, 11:09 AM

## 2023-10-19 NOTE — Progress Notes (Signed)
 PT Cancellation Note  Patient Details Name: Bradley Cummings MRN: [id number] DOB: 03/16/1991   Cancelled Treatment:    Reason Eval/Treat Not Completed: Patient at procedure or test/unavailable (with Psychology)   [name] 10/19/2023, 8:43 AM Annis Baseman, PT Acute Rehabilitation Services Office: [phone]

## 2023-10-20 DIAGNOSIS — F1010 Alcohol abuse, uncomplicated: Secondary | ICD-10-CM | POA: Diagnosis not present

## 2023-10-20 DIAGNOSIS — I509 Heart failure, unspecified: Secondary | ICD-10-CM | POA: Diagnosis not present

## 2023-10-20 DIAGNOSIS — I5020 Unspecified systolic (congestive) heart failure: Secondary | ICD-10-CM

## 2023-10-20 DIAGNOSIS — G9341 Metabolic encephalopathy: Secondary | ICD-10-CM | POA: Diagnosis not present

## 2023-10-20 DIAGNOSIS — K7011 Alcoholic hepatitis with ascites: Secondary | ICD-10-CM | POA: Diagnosis not present

## 2023-10-20 DIAGNOSIS — E871 Hypo-osmolality and hyponatremia: Secondary | ICD-10-CM | POA: Diagnosis not present

## 2023-10-20 DIAGNOSIS — F331 Major depressive disorder, recurrent, moderate: Secondary | ICD-10-CM | POA: Diagnosis not present

## 2023-10-20 DIAGNOSIS — F1020 Alcohol dependence, uncomplicated: Secondary | ICD-10-CM | POA: Diagnosis not present

## 2023-10-20 LAB — COMPREHENSIVE METABOLIC PANEL WITH GFR
ALT: 290 U/L — ABNORMAL HIGH (ref 0–44)
AST: 318 U/L — ABNORMAL HIGH (ref 15–41)
Albumin: 2.7 g/dL — ABNORMAL LOW (ref 3.5–5.0)
Alkaline Phosphatase: 100 U/L (ref 38–126)
Anion gap: 10 (ref 5–15)
BUN: 40 mg/dL — ABNORMAL HIGH (ref 6–20)
CO2: 25 mmol/L (ref 22–32)
Calcium: 8.1 mg/dL — ABNORMAL LOW (ref 8.9–10.3)
Chloride: 93 mmol/L — ABNORMAL LOW (ref 98–111)
Creatinine, Ser: 1.49 mg/dL — ABNORMAL HIGH (ref 0.61–1.24)
GFR, Estimated: >60 mL/min (ref >60)
Glucose, Bld: 116 mg/dL — ABNORMAL HIGH (ref 70–99)
Potassium: 4.3 mmol/L (ref 3.5–5.1)
Sodium: 128 mmol/L — ABNORMAL LOW (ref 135–145)
Total Bilirubin: 3.3 mg/dL — ABNORMAL HIGH (ref 0.0–1.2)
Total Protein: 6 g/dL — ABNORMAL LOW (ref 6.5–8.1)

## 2023-10-20 LAB — GLUCOSE, CAPILLARY
Glucose-Capillary: 113 mg/dL — ABNORMAL HIGH (ref 70–99)
Glucose-Capillary: 118 mg/dL — ABNORMAL HIGH (ref 70–99)
Glucose-Capillary: 165 mg/dL — ABNORMAL HIGH (ref 70–99)

## 2023-10-20 LAB — PHOSPHORUS: Phosphorus: 5.4 mg/dL — ABNORMAL HIGH (ref 2.5–4.6)

## 2023-10-20 LAB — MAGNESIUM: Magnesium: 2.2 mg/dL (ref 1.7–2.4)

## 2023-10-20 LAB — PROTIME-INR
INR: 2.7 — ABNORMAL HIGH (ref 0.8–1.2)
Prothrombin Time: 28.6 s — ABNORMAL HIGH (ref 11.4–15.2)

## 2023-10-20 LAB — VITAMIN C: Vitamin C: 0.7 mg/dL (ref 0.4–2.0)

## 2023-10-20 MED ORDER — NICOTINE 14 MG/24HR TD PT24
14.0000 mg | MEDICATED_PATCH | Freq: Every day | TRANSDERMAL | Status: DC
  Administered 2023-10-21 – 2023-10-23 (×3): 14 mg via TRANSDERMAL
  Filled 2023-10-20 (×3): qty 1

## 2023-10-20 MED ORDER — FUROSEMIDE 10 MG/ML IJ SOLN
40.0000 mg | Freq: Once | INTRAMUSCULAR | Status: AC
  Administered 2023-10-20: 40 mg via INTRAVENOUS
  Filled 2023-10-20: qty 4

## 2023-10-20 MED ORDER — TROLAMINE SALICYLATE 10 % EX CREA: TOPICAL_CREAM | Freq: Two times a day (BID) | CUTANEOUS | Status: DC | PRN

## 2023-10-20 MED ORDER — LACTULOSE ENEMA
300.0000 mL | Freq: Two times a day (BID) | ORAL | Status: DC
  Filled 2023-10-20 (×3): qty 300

## 2023-10-20 MED ORDER — LACTULOSE 10 GM/15ML PO SOLN
20.0000 g | Freq: Two times a day (BID) | ORAL | Status: DC
  Administered 2023-10-20 – 2023-10-21 (×3): 20 g via ORAL
  Filled 2023-10-20 (×2): qty 30

## 2023-10-20 MED ORDER — NICOTINE 14 MG/24HR TD PT24
14.0000 mg | MEDICATED_PATCH | Freq: Every day | TRANSDERMAL | Status: DC
Start: 2023-10-21 — End: 2023-10-20

## 2023-10-20 NOTE — Progress Notes (Signed)
 Occupational Therapy Treatment Patient Details Name: Bradley Cummings MRN: [id number] DOB: 05/04/1991 Today's Date: 10/20/2023   History of present illness 33 yo male admitted 10/15/23 with AMS, severe hyponatremia, volume overload and edema. PMHx: ETOH abuse, SAH   OT comments  Pt making slow progress toward goals, overall with flat affect and delayed initiation of BADL/mobility tasks throughout session. Pt needing min A for UB ADL and min A for transfers/ambulation with RW. Pt with incr tremoring/shaking with standing, BP and SPO2 WNL, pt states this "sometimes happens at home". Pt reports he will have someone at home at all times who could assist if needed. Pt presenting with impairments listed below, will follow acutely. Continue to recommend OP OT at d/c.       If plan is discharge home, recommend the following:  A little help with walking and/or transfers;A little help with bathing/dressing/bathroom;Assistance with cooking/housework;Direct supervision/assist for medications management;Direct supervision/assist for financial management;Help with stairs or ramp for entrance;Assist for transportation;Supervision due to cognitive status   Equipment Recommendations  BSC/3in1;Tub/shower seat    Recommendations for Other Services PT consult    Precautions / Restrictions Precautions Precautions: Fall Restrictions Weight Bearing Restrictions Per Provider Order: No       Mobility Bed Mobility Overal bed mobility: Needs Assistance Bed Mobility: Supine to Sit, Sit to Supine     Supine to sit: Supervision Sit to supine: Supervision        Transfers Overall transfer level: Needs assistance   Transfers: Sit to/from Stand Sit to Stand: Min assist           General transfer comment: pt with tremoring in standing, keeps BLE slightly flexed     Balance Overall balance assessment: Needs assistance Sitting-balance support: No upper extremity supported Sitting balance-Leahy Scale:  Good       Standing balance-Leahy Scale: Fair Standing balance comment: benefits from external support, can stand statically without AD                           ADL either performed or assessed with clinical judgement   ADL Overall ADL's : Needs assistance/impaired                     Lower Body Dressing: Minimal assistance;Sitting/lateral leans Lower Body Dressing Details (indicate cue type and reason): via figure 4 Toilet Transfer: Minimal assistance;Ambulation;Rolling walker (2 wheels)           Functional mobility during ADLs: Minimal assistance;Rolling walker (2 wheels)      Extremity/Trunk Assessment Upper Extremity Assessment Upper Extremity Assessment: Generalized weakness   Lower Extremity Assessment Lower Extremity Assessment: Generalized weakness        Vision   Additional Comments: appears [facility]   Perception Perception Perception: Not tested   Praxis Praxis Praxis: Not tested   Communication Communication Communication: Other (comment) (soft spoken at times)   Cognition Arousal: Alert Behavior During Therapy: Flat affect Cognition: No family/caregiver present to determine baseline, Cognition impaired   Orientation impairments: Place (says [facility]?, reoriented) Awareness: Online awareness impaired   Attention impairment (select first level of impairment): Sustained attention Executive functioning impairment (select all impairments): Initiation, Problem solving OT - Cognition Comments: requires incr verbal cues to initiate tasks, including donning socks/standing/lifting arms up for gait belt placement. Pt oriented to date, able to recall 1/3 words, can state months of year backwards, and able to perform simple math task  Following commands: Impaired Following commands impaired: Follows one step commands with increased time      Cueing   Cueing Techniques: Verbal cues  Exercises      Shoulder  Instructions       General Comments VSS    Pertinent Vitals/ Pain       Pain Assessment Pain Assessment: Faces Pain Score: 2  Faces Pain Scale: Hurts a little bit Pain Location: ankle/feet Pain Descriptors / Indicators: Discomfort Pain Intervention(s): Limited activity within patient's tolerance, Monitored during session, Repositioned  Home Living                                          Prior Functioning/Environment              Frequency  Min 2X/week        Progress Toward Goals  OT Goals(current goals can now be found in the care plan section)  Progress towards OT goals: Progressing toward goals  Acute Rehab OT Goals Patient Stated Goal: none stated OT Goal Formulation: With patient Time For Goal Achievement: 11/02/23 Potential to Achieve Goals: Good ADL Goals Pt Will Perform Upper Body Dressing: with modified independence;sitting Pt Will Perform Lower Body Dressing: with modified independence;sit to/from stand;sitting/lateral leans Pt Will Transfer to Toilet: with modified independence;ambulating;regular height toilet Pt Will Perform Tub/Shower Transfer: Tub transfer;Shower transfer;with supervision;ambulating Additional ADL Goal #1: pt will receive passing score on pillbox assessment in prep for IADLs  Plan      Co-evaluation                 AM-PAC OT "6 Clicks" Daily Activity     Outcome Measure   Help from another person eating meals?: A Little Help from another person taking care of personal grooming?: A Little Help from another person toileting, which includes using toliet, bedpan, or urinal?: A Little Help from another person bathing (including washing, rinsing, drying)?: A Lot Help from another person to put on and taking off regular upper body clothing?: A Little Help from another person to put on and taking off regular lower body clothing?: A Lot 6 Click Score: 16    End of Session Equipment Utilized During  Treatment: Gait belt;Rolling walker (2 wheels)  OT Visit Diagnosis: Unsteadiness on feet (R26.81);Other abnormalities of gait and mobility (R26.89);Muscle weakness (generalized) (M62.81);Other symptoms and signs involving cognitive function   Activity Tolerance Patient tolerated treatment well   Patient Left in bed;with call bell/phone within reach;with bed alarm set   Nurse Communication Mobility status        Time: 6213-0865 OT Time Calculation (min): 28 min  Charges: OT General Charges $OT Visit: 1 Visit OT Treatments $Self Care/Home Management : 8-22 mins $Therapeutic Activity: 8-22 mins  Monee Dembeck K, OTD, OTR/L SecureChat Preferred Acute Rehab [phone]   Benedict Brain Koonce 10/20/2023, 10:58 AM

## 2023-10-20 NOTE — Progress Notes (Signed)
   Patient Name: Bradley Cummings Date of Encounter: 10/20/2023 [facility] Cardiologist: None   Interval Summary  .    Resting comfortably in bed in no distress  Vital Signs .    Vitals:   10/20/23 0740 10/20/23 0800 10/20/23 0900 10/20/23 1000  BP:  95/81 97/80 96/71   Pulse:  (!) 189  86  Resp:  17 16 (!) 25  Temp: (!) 96.9 F (36.1 C)     TempSrc: Axillary     SpO2:  100%  99%  Weight:      Height:        Intake/Output Summary (Last 24 hours) at 10/20/2023 1058 Last data filed at 10/20/2023 1000 Gross per 24 hour  Intake 355.5 ml  Output 1625 ml  Net -1269.5 ml      10/20/2023    5:00 AM 10/19/2023    7:29 AM 10/18/2023    5:00 AM  Last 3 Weights  Weight (lbs) 122 lb 9.2 oz 121 lb 7.6 oz 158 lb 15.2 oz  Weight (kg) 55.6 kg 55.1 kg 72.1 kg      Telemetry/ECG    Currently sinus rhythm- Personally Reviewed  Physical Exam .   GEN: No acute distress.  Sleeping Neck: No JVD Cardiac: RRR, no murmurs, rubs, or gallops.  Respiratory: Clear to auscultation bilaterally. GI: Soft, nontender, non-distended  MS: 1+ bilateral lower extremity edema  Assessment & Plan .     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  We will give Lasix  40 mg IV x 1 today.  NSVT seen.  Both from heart failure as well as liver -Low-dose metoprolol .  Hold parameters noted. -Prednisone max for hepatitis.  GI following.  For questions or updates, please contact [facility] Please consult [email or web address] for contact info under        Signed, Dorothye Gathers, MD

## 2023-10-20 NOTE — Progress Notes (Signed)
 [facility] ROUNDING NOTE   Subjective: Patient alert and sitting up eating breakfast this morning.  Patient's sister and her boyfriend at his bedside.  Patient is soft-spoken, but denies any complaints.  Does admit to feeling overwhelmed with anxiety and "things going through my head"   Objective: Vital signs in last 24 hours: Temp:  [96.4 F (35.8 C)-97.7 F (36.5 C)] 96.9 F (36.1 C) (05/21 0740) Pulse Rate:  [77-189] 189 (05/21 0800) Resp:  [16-36] 16 (05/21 0900) BP: (74-117)/(56-85) 97/80 (05/21 0900) SpO2:  [80 %-99 %] 84 % (05/21 0800) Weight:  [55.6 kg] 55.6 kg (05/21 0500) Last BM Date : 10/19/23 General: NAD Lungs:  CTA b/l, no w/r/r Heart:  RRR, no m/r/g Abdomen:  Soft, NT, ND, +BS Ext:  No c/c/e Neuro:  Asterixis noted    Intake/Output from previous day: 05/20 0701 - 05/21 0700 In: 585.5 [P.O.:425; I.V.:20; IV Piggyback:100.5] Out: 1125 [Stool:1125] Intake/Output this shift: No intake/output data recorded.   Lab Results: Recent Labs    10/19/23 0512  WBC 5.2  HGB 11.3*  PLT 184  MCV 83.8   BMET Recent Labs    10/18/23 0458 10/19/23 0512 10/20/23 0614  NA 127* 126* 128*  K 4.6 5.4* 4.3  CL 90* 91* 93*  CO2 25 21* 25  GLUCOSE 116* 101* 116*  BUN 27* 39* 40*  CREATININE 1.58* 1.98* 1.49*  CALCIUM 8.9 8.7* 8.1*   LFT Recent Labs    10/18/23 0458 10/19/23 0512 10/20/23 0614  PROT 6.7 6.3* 6.0*  ALBUMIN 2.9* 2.7* 2.7*  AST 678* 492* 318*  ALT 479* 363* 290*  ALKPHOS 121 116 100  BILITOT 2.6* 3.4* 3.3*   PT/INR Recent Labs    10/19/23 0512 10/20/23 0614  INR 2.7* 2.7*      Imaging/Other results: No results found.    Assessment and Plan:  33 year old male with long history of alcohol abuse, admitted with altered mental status, severe hyponatremia, and elevated liver enzymes (AST 1600, ALT 700, T. bili 5) consistent with alcoholic hepatitis, also found to have significant cardiomyopathy (EF less than 20%)  attributed to alcoholic cardiomyopathy.  Alcoholic hepatitis Discriminant function 61 on 5/17, started on prednisolone  Ultrasound with trace perihepatic ascites, inadequate for paracentesis.  Steatosis, but no evidence of cirrhosis. Evaluation for other causes of liver disease unremarkable other than low titer anti-smooth muscle antibody, IgG levels normal.  Based on patient's history of heavy alcohol use, AST to ALT ratio and low titer antibody level, low suspicion for autoimmune hepatitis. - Continue prednisolone , calculate Lille score Friday to determine if benefit of ongoing steroids - Consider liver biopsy to definitively rule out other causes if liver function not improving (patient's reported alcohol use seems unlikely to cause such severe cardiomyopathy and liver disease, and normal ferritin level very atypical for alcoholic hepatitis) although elevated INR may pose unacceptable bleeding risk. -Monitor p.o. intake, consider enteral feeding for nutrition support which is critical for recovering from alcoholic hepatitis - Patient again counseled on seriousness of alcoholic hepatitis, and high mortality risk in the setting of ongoing alcohol use  Encephalopathy Mental status has waxed and waned, complicated by symptoms of alcohol withdrawal, somnolence from Ativan , also with likely underlying hepatic encephalopathy given presence of asterixis and elevated ammonia.  Mental status normal on my exam today, but asterixis present - Continue lactulose  twice daily, titrate to 3 bowel movements daily - Limit Ativan  as much as possible  Coagulopathy Likely secondary to liver dysfunction Vitamin K 5 mg IV  x 3, so far not improving INR  Acute kidney injury Likely prerenal in the setting of severe systolic dysfunction and diuretic therapy Somewhat improved today -Continue to hold diuretics -Consider albumin  Severe cardiomyopathy (EF less than 20%, severe global hypokinesis) Cardiology evaluated,  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 tach, hypotension limiting goal-directed medical therapy   Elois Hair, MD  10/20/2023, 9:23 AM [facility]
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
## 2023-10-21 DIAGNOSIS — F1020 Alcohol dependence, uncomplicated: Secondary | ICD-10-CM | POA: Diagnosis not present

## 2023-10-21 DIAGNOSIS — F1010 Alcohol abuse, uncomplicated: Secondary | ICD-10-CM | POA: Diagnosis not present

## 2023-10-21 DIAGNOSIS — G9341 Metabolic encephalopathy: Secondary | ICD-10-CM | POA: Diagnosis not present

## 2023-10-21 DIAGNOSIS — I509 Heart failure, unspecified: Secondary | ICD-10-CM | POA: Diagnosis not present

## 2023-10-21 DIAGNOSIS — E440 Moderate protein-calorie malnutrition: Secondary | ICD-10-CM | POA: Insufficient documentation

## 2023-10-21 DIAGNOSIS — F331 Major depressive disorder, recurrent, moderate: Secondary | ICD-10-CM | POA: Diagnosis not present

## 2023-10-21 DIAGNOSIS — E871 Hypo-osmolality and hyponatremia: Secondary | ICD-10-CM | POA: Diagnosis not present

## 2023-10-21 DIAGNOSIS — K7011 Alcoholic hepatitis with ascites: Secondary | ICD-10-CM | POA: Diagnosis not present

## 2023-10-21 LAB — COMPREHENSIVE METABOLIC PANEL WITH GFR
ALT: 243 U/L — ABNORMAL HIGH (ref 0–44)
AST: 239 U/L — ABNORMAL HIGH (ref 15–41)
Albumin: 2.7 g/dL — ABNORMAL LOW (ref 3.5–5.0)
Alkaline Phosphatase: 95 U/L (ref 38–126)
Anion gap: 11 (ref 5–15)
BUN: 36 mg/dL — ABNORMAL HIGH (ref 6–20)
CO2: 26 mmol/L (ref 22–32)
Calcium: 8.5 mg/dL — ABNORMAL LOW (ref 8.9–10.3)
Chloride: 93 mmol/L — ABNORMAL LOW (ref 98–111)
Creatinine, Ser: 1.25 mg/dL — ABNORMAL HIGH (ref 0.61–1.24)
GFR, Estimated: >60 mL/min (ref >60)
Glucose, Bld: 119 mg/dL — ABNORMAL HIGH (ref 70–99)
Potassium: 3.9 mmol/L (ref 3.5–5.1)
Sodium: 130 mmol/L — ABNORMAL LOW (ref 135–145)
Total Bilirubin: 2.6 mg/dL — ABNORMAL HIGH (ref 0.0–1.2)
Total Protein: 6 g/dL — ABNORMAL LOW (ref 6.5–8.1)

## 2023-10-21 LAB — GLUCOSE, CAPILLARY
Glucose-Capillary: 117 mg/dL — ABNORMAL HIGH (ref 70–99)
Glucose-Capillary: 112 mg/dL — ABNORMAL HIGH (ref 70–99)
Glucose-Capillary: 143 mg/dL — ABNORMAL HIGH (ref 70–99)
Glucose-Capillary: 152 mg/dL — ABNORMAL HIGH (ref 70–99)

## 2023-10-21 LAB — MAGNESIUM: Magnesium: 2.1 mg/dL (ref 1.7–2.4)

## 2023-10-21 LAB — PROTIME-INR
INR: 2.3 — ABNORMAL HIGH (ref 0.8–1.2)
Prothrombin Time: 25.1 s — ABNORMAL HIGH (ref 11.4–15.2)

## 2023-10-21 LAB — PHOSPHORUS: Phosphorus: 3.5 mg/dL (ref 2.5–4.6)

## 2023-10-21 MED ORDER — FUROSEMIDE 10 MG/ML IJ SOLN
40.0000 mg | Freq: Once | INTRAMUSCULAR | Status: AC
  Administered 2023-10-21: 40 mg via INTRAVENOUS
  Filled 2023-10-21: qty 4

## 2023-10-21 MED ORDER — VITAMIN D (ERGOCALCIFEROL) 1.25 MG (50000 UNIT) PO CAPS
50000.0000 [IU] | ORAL_CAPSULE | ORAL | Status: DC
  Administered 2023-10-21: 50000 [IU] via ORAL
  Filled 2023-10-21 (×2): qty 1

## 2023-10-21 MED ORDER — LACTULOSE ENEMA
300.0000 mL | Freq: Two times a day (BID) | ORAL | Status: DC
  Filled 2023-10-21 (×5): qty 300

## 2023-10-21 MED ORDER — LACTULOSE 10 GM/15ML PO SOLN
30.0000 g | Freq: Two times a day (BID) | ORAL | Status: DC
  Administered 2023-10-21 – 2023-10-23 (×4): 30 g via ORAL
  Filled 2023-10-21 (×4): qty 45

## 2023-10-21 MED ORDER — VITAMIN K1 10 MG/ML IJ SOLN
5.0000 mg | Freq: Once | INTRAVENOUS | Status: AC
  Administered 2023-10-21: 5 mg via INTRAVENOUS
  Filled 2023-10-21: qty 0.5
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
## 2023-10-21 NOTE — Progress Notes (Addendum)
 Patient ID: Bradley Cummings, male   DOB: 1990-08-19, 33 y.o.   MRN: [id number]    Progress Note   Subjective   Day # 5 CC; altered mental status, EtOH withdrawal, EtOH hepatitis, severe hyponatremia, EtOH induced severe cardiomyopathy  Prednisolone  day #6  Patient is out of bed, up to chair currently still seems drowsy but able to converse, sister at bedside No complaints of abdominal discomfort, not eating much because he says the food is bland. Bowel movements yesterday, none thus far today Endorses brain fog-reports he was able to ambulate in the unit earlier this morning Says he would like to be discharged today  Labs-INR 2.3 improved Sodium 130/potassium 3.9/BUN 36/creatinine 1.25 improved T. bili 2.6/alk phos 95/AST 239/ALT 243 improved   Objective   Vital signs in last 24 hours: Temp:  [96.8 F (36 C)-98.8 F (37.1 C)] 98.5 F (36.9 C) (05/22 0707) Pulse Rate:  [78-176] 85 (05/22 0600) Resp:  [0-33] 23 (05/22 0600) BP: (83-107)/(59-89) 95/63 (05/22 0600) SpO2:  [88 %-100 %] 100 % (05/22 0600) Last BM Date : 10/19/23 General: Thin [name] in NAD affect flat, conversing appropriately Heart:  Regular rate and rhythm; no murmurs Lungs: Respirations even and unlabored, lungs CTA bilaterally Abdomen:  Soft, nontender and nondistended. Normal bowel sounds. Extremities:  Without edema. Neurologic:  Alert and oriented, subtle asterixis Psych:  Cooperative.  Affect flat  Intake/Output from previous day: 05/21 0701 - 05/22 0700 In: 120.5 [P.O.:70; IV Piggyback:50.5] Out: 1200 [Urine:1200] Intake/Output this shift: No intake/output data recorded.  Lab Results: Recent Labs    10/19/23 0512  WBC 5.2  HGB 11.3*  HCT 33.1*  PLT 184   BMET Recent Labs    10/19/23 0512 10/20/23 0614 10/21/23 0342  NA 126* 128* 130*  K 5.4* 4.3 3.9  CL 91* 93* 93*  CO2 21* 25 26  GLUCOSE 101* 116* 119*  BUN 39* 40* 36*  CREATININE 1.98* 1.49* 1.25*  CALCIUM 8.7*  8.1* 8.5*   LFT Recent Labs    10/21/23 0342  PROT 6.0*  ALBUMIN 2.7*  AST 239*  ALT 243*  ALKPHOS 95  BILITOT 2.6*   PT/INR Recent Labs    10/20/23 0614 10/21/23 0342  LABPROT 28.6* 25.1*  INR 2.7* 2.3*        Assessment / Plan:    #34 33 year old white male with history of EtOH abuse admitted with bilateral lower extremity edema over the past couple of weeks-then on admit found to have acute kidney injury, severe hyponatremia, altered mental status and LFTs concerning for acute severe alcoholic hepatitis.  Discriminant function score of 61-ordered prednisolone  10/16/2023  Autoimmune markers negative other than positive smooth muscle antibody/F-actin  He is now showing good improvement in hepatic markers  #2 ETOH  withdrawal-resolving #3 Hepatic encephalopathy-improved but not back to his baseline #4 hyponatremia resolved-stable #5 severe cardiomyopathy EF this admission less than 20%-urology following  Plan; 2 g sodium diet Continue prednisolone , will recalculate Lilly score tomorrow Completing 3 days of vitamin K Will increase lactulose  to 30 gm BID - goal to 3 bowel movements per day If continued improvement hopefully can be discharged home in the next couple of days. Needs substance abuse rehab on discharge/psychiatry has signed off     Principal Problem:   Hyponatremia Active Problems:   Alcohol use disorder, moderate, dependence (HCC)   AMS (altered mental status)   Alcoholic hepatitis   Major depressive disorder, recurrent episode, moderate (HCC)   Congestive heart failure (HCC)  Moderate mitral valve regurgitation   Biatrial enlargement   AKI (acute kidney injury) (HCC)   Acute metabolic encephalopathy   Alcohol abuse     LOS: 6 days   Thara Searing EsterwoodPA-C  10/21/2023, 10:20 AM
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
## 2023-10-21 NOTE — Progress Notes (Signed)
 Physical Therapy Treatment Patient Details Name: Bradley Cummings MRN: [id number] DOB: 03/09/1991 Today's Date: 10/21/2023   History of Present Illness 33 yo male admitted 10/15/23 with AMS, severe hyponatremia, volume overload and edema. PMHx: ETOH abuse, SAH    PT Comments  Pt pleasant with increased gait tolerance and balance this session. Pt asking to return home and educated for continued need for supervision and balance deficits. OPPT remains appropriate.     If plan is discharge home, recommend the following: Supervision due to cognitive status;Assistance with cooking/housework;Assist for transportation;Direct supervision/assist for medications management   Can travel by private vehicle        Equipment Recommendations  None recommended by PT    Recommendations for Other Services       Precautions / Restrictions Precautions Precautions: Fall     Mobility  Bed Mobility Overal bed mobility: Needs Assistance Bed Mobility: Supine to Sit     Supine to sit: Supervision     General bed mobility comments: HOB 15 degrees, supervision for lines    Transfers Overall transfer level: Needs assistance   Transfers: Sit to/from Stand Sit to Stand: Contact guard assist           General transfer comment: pt able to stand from bed and recliner with CGA for safety and lines. 10 repeated sit to stands without UB support end of session with pt attempting but required UB support x 3 trials to complete    Ambulation/Gait Ambulation/Gait assistance: Contact guard assist Gait Distance (Feet): 450 Feet Assistive device: None Gait Pattern/deviations: Step-through pattern, Decreased stride length   Gait velocity interpretation: 1.31 - 2.62 ft/sec, indicative of limited community ambulator   General Gait Details: pt with slight right lean during gait with CGA for safety and cues to increase stride and speed to baseline   Stairs             Wheelchair Mobility     Tilt  Bed    Modified Rankin (Stroke Patients Only)       Balance Overall balance assessment: Needs assistance Sitting-balance support: No upper extremity supported Sitting balance-Leahy Scale: Good       Standing balance-Leahy Scale: Good Standing balance comment: walking with CGA                            Communication Communication Communication: No apparent difficulties  Cognition Arousal: Alert Behavior During Therapy: Flat affect   PT - Cognitive impairments: Safety/Judgement                       PT - Cognition Comments: pt A &O x 4 this session Following commands: Intact Following commands impaired: Only follows one step commands consistently    Cueing Cueing Techniques: Verbal cues  Exercises      General Comments        Pertinent Vitals/Pain Pain Assessment Pain Assessment: No/denies pain    Home Living                          Prior Function            PT Goals (current goals can now be found in the care plan section) Progress towards PT goals: Progressing toward goals    Frequency    Min 2X/week      PT Plan      Co-evaluation  AM-PAC PT "6 Clicks" Mobility   Outcome Measure  Help needed turning from your back to your side while in a flat bed without using bedrails?: None Help needed moving from lying on your back to sitting on the side of a flat bed without using bedrails?: None Help needed moving to and from a bed to a chair (including a wheelchair)?: A Little Help needed standing up from a chair using your arms (e.g., wheelchair or bedside chair)?: A Little Help needed to walk in hospital room?: A Little Help needed climbing 3-5 steps with a railing? : A Little 6 Click Score: 20    End of Session Equipment Utilized During Treatment: Gait belt Activity Tolerance: Patient tolerated treatment well Patient left: in chair;with call bell/phone within reach;with chair alarm set;with  family/visitor present Nurse Communication: Mobility status PT Visit Diagnosis: Other abnormalities of gait and mobility (R26.89);Difficulty in walking, not elsewhere classified (R26.2)     Time: 0920-0940 PT Time Calculation (min) (ACUTE ONLY): 20 min  Charges:    $Gait Training: 8-22 mins PT General Charges $$ ACUTE PT VISIT: 1 Visit                     Annis Baseman, PT Acute Rehabilitation Services Office: [phone]    Luwana Salvo 10/21/2023, 10:44 AM

## 2023-10-22 ENCOUNTER — Other Ambulatory Visit: Payer: Self-pay

## 2023-10-22 ENCOUNTER — Telehealth: Payer: Self-pay

## 2023-10-22 DIAGNOSIS — E871 Hypo-osmolality and hyponatremia: Secondary | ICD-10-CM | POA: Diagnosis not present

## 2023-10-22 DIAGNOSIS — G9341 Metabolic encephalopathy: Secondary | ICD-10-CM | POA: Diagnosis not present

## 2023-10-22 DIAGNOSIS — F1010 Alcohol abuse, uncomplicated: Secondary | ICD-10-CM | POA: Diagnosis not present

## 2023-10-22 DIAGNOSIS — I509 Heart failure, unspecified: Secondary | ICD-10-CM | POA: Diagnosis not present

## 2023-10-22 DIAGNOSIS — D509 Iron deficiency anemia, unspecified: Secondary | ICD-10-CM

## 2023-10-22 DIAGNOSIS — K7010 Alcoholic hepatitis without ascites: Secondary | ICD-10-CM

## 2023-10-22 DIAGNOSIS — K7011 Alcoholic hepatitis with ascites: Secondary | ICD-10-CM | POA: Diagnosis not present

## 2023-10-22 DIAGNOSIS — I5020 Unspecified systolic (congestive) heart failure: Secondary | ICD-10-CM | POA: Diagnosis not present

## 2023-10-22 LAB — CBC WITH DIFFERENTIAL/PLATELET
Abs Immature Granulocytes: 0.02 10*3/uL (ref 0.00–0.07)
Basophils Absolute: 0 10*3/uL (ref 0.0–0.1)
Basophils Relative: 0 %
Eosinophils Absolute: 0 10*3/uL (ref 0.0–0.5)
Eosinophils Relative: 0 %
HCT: 29.1 % — ABNORMAL LOW (ref 39.0–52.0)
Hemoglobin: 10 g/dL — ABNORMAL LOW (ref 13.0–17.0)
Immature Granulocytes: 0 %
Lymphocytes Relative: 13 %
Lymphs Abs: 0.6 10*3/uL — ABNORMAL LOW (ref 0.7–4.0)
MCH: 28.8 pg (ref 26.0–34.0)
MCHC: 34.4 g/dL (ref 30.0–36.0)
MCV: 83.9 fL (ref 80.0–100.0)
Monocytes Absolute: 0.8 10*3/uL (ref 0.1–1.0)
Monocytes Relative: 18 %
Neutro Abs: 3.2 10*3/uL (ref 1.7–7.7)
Neutrophils Relative %: 69 %
Platelets: 177 10*3/uL (ref 150–400)
RBC: 3.47 MIL/uL — ABNORMAL LOW (ref 4.22–5.81)
RDW: 20.4 % — ABNORMAL HIGH (ref 11.5–15.5)
WBC: 4.6 10*3/uL (ref 4.0–10.5)
nRBC: 0.4 % — ABNORMAL HIGH (ref 0.0–0.2)

## 2023-10-22 LAB — COMPREHENSIVE METABOLIC PANEL WITH GFR
ALT: 192 U/L — ABNORMAL HIGH (ref 0–44)
AST: 162 U/L — ABNORMAL HIGH (ref 15–41)
Albumin: 2.5 g/dL — ABNORMAL LOW (ref 3.5–5.0)
Alkaline Phosphatase: 93 U/L (ref 38–126)
Anion gap: 8 (ref 5–15)
BUN: 31 mg/dL — ABNORMAL HIGH (ref 6–20)
CO2: 28 mmol/L (ref 22–32)
Calcium: 8.4 mg/dL — ABNORMAL LOW (ref 8.9–10.3)
Chloride: 96 mmol/L — ABNORMAL LOW (ref 98–111)
Creatinine, Ser: 1.1 mg/dL (ref 0.61–1.24)
GFR, Estimated: >60 mL/min
Glucose, Bld: 109 mg/dL — ABNORMAL HIGH (ref 70–99)
Potassium: 3.5 mmol/L (ref 3.5–5.1)
Sodium: 132 mmol/L — ABNORMAL LOW (ref 135–145)
Total Bilirubin: 2.2 mg/dL — ABNORMAL HIGH (ref 0.0–1.2)
Total Protein: 5.8 g/dL — ABNORMAL LOW (ref 6.5–8.1)

## 2023-10-22 LAB — PROTIME-INR
INR: 1.9 — ABNORMAL HIGH (ref 0.8–1.2)
Prothrombin Time: 21.9 s — ABNORMAL HIGH (ref 11.4–15.2)

## 2023-10-22 LAB — GLUCOSE, CAPILLARY
Glucose-Capillary: 107 mg/dL — ABNORMAL HIGH (ref 70–99)
Glucose-Capillary: 138 mg/dL — ABNORMAL HIGH (ref 70–99)
Glucose-Capillary: 180 mg/dL — ABNORMAL HIGH (ref 70–99)

## 2023-10-22 LAB — VITAMIN A: Vitamin A (Retinoic Acid): 11.6 ug/dL — ABNORMAL LOW (ref 18.9–57.3)

## 2023-10-22 LAB — MAGNESIUM: Magnesium: 2 mg/dL (ref 1.7–2.4)

## 2023-10-22 LAB — PHOSPHORUS: Phosphorus: 3.6 mg/dL (ref 2.5–4.6)

## 2023-10-22 LAB — AMMONIA: Ammonia: 31 umol/L (ref 9–35)

## 2023-10-22 MED ORDER — FUROSEMIDE 10 MG/ML IJ SOLN
40.0000 mg | Freq: Once | INTRAMUSCULAR | Status: AC
  Administered 2023-10-22: 40 mg via INTRAVENOUS
  Filled 2023-10-22: qty 4

## 2023-10-22 MED ORDER — POTASSIUM CHLORIDE CRYS ER 20 MEQ PO TBCR
40.0000 meq | EXTENDED_RELEASE_TABLET | Freq: Once | ORAL | Status: AC
  Administered 2023-10-22: 40 meq via ORAL
  Filled 2023-10-22: qty 2

## 2023-10-22 NOTE — Progress Notes (Signed)
 Calorie Count Note  Pt sleeping on visit, sister at bedside. She reports pt did a lot better yesterday with his intake. Feels he is improving, ready for both of them to go home. Pt's intake has improved, calorie count showed he is meeting 97% of his calorie needs and 99% of his protein needs.  Had 50% of his breakfast today. Encouraged sister to continue to prompt pt to eat as able. He is enjoying the supplements and drinking 1-2 Ensures per day. Likes the Magic cups.   Do not think pt needs enteral feeding at this time due to increased PO intake. However, if intake starts to decline and pt starts declining supplements, recommend initiation of nutrition support.   RD provided "Low Sodium Nutrition Therapy" handout from the Academy of [employer]. Reviewed patient's dietary recall. Provided examples on ways to decrease sodium intake in diet. Discouraged intake of processed foods and use of salt shaker. Encouraged fresh fruits and vegetables as well as whole grain sources of carbohydrates to maximize fiber intake.   RD discussed why it is important for patient to adhere to diet recommendations, and emphasized the role of fluids, foods to avoid, and importance of weighing self daily. Teach back method used.  Ok for discharge from GI standpoint.   24 hour calorie count ordered.  Diet: Regular, 2 gram low sodium  Supplements: Ensure Enlive, Magic cups   5/22: Breakfast: 330 kcal, 20 gm protein: 25% sausage, scrambled eggs, and toast Lunch: 300 kcal, 15 gm protein: 100% carrots, 75% steak, 50% mashed potatoes sand gravy  Dinner: 228 kcal, 15 gm protein: 50% chicken, gravy, rice Supplements: 2 Ensures, 1 Magic cup (990 kcal, 49 gm protein)  Total intake: 1848 kcal (97% of minimum estimated needs)  99 gm protein (99% of minimum estimated needs)  Estimated Nutritional Needs:  Kcal:  1900-2100 kcal/d Protein:  100-115 g/d Fluid:  2L/d  INTERVENTION:    Encourage PO intake  (currently on 2 gram sodium diet) Room service with assist  Discontinue calorie count Continue Ensure Enlive po BID, each supplement provides 350 kcal and 20 grams of protein. Continue Magic cup TID with meals, each supplement provides 290 kcal and 9 grams of protein Continue MVI with minerals daily 50,000 units vitamin D2 once weekly x 8 weeks for vitamin D  deficiency and then 1,000 units daily for maintenance  Provided low sodium nutrition education and provided handouts   NUTRITION DIAGNOSIS:    Moderate Malnutrition related to chronic illness as evidenced by mild muscle depletion, mild fat depletion, energy intake < or equal to 75% for > or equal to 1 month.   - Still applicable    GOAL:    Patient will meet greater than or equal to 90% of their needs   - Progressing   MONITOR:    PO intake, Supplement acceptance, Labs, Weight trends, I & O's  Bradley Cummings, RD Registered Dietitian  See Amion for more information

## 2023-10-22 NOTE — Progress Notes (Signed)
 PT Cancellation Note  Patient Details Name: Bradley Cummings MRN: [id number] DOB: August 04, 1990   Cancelled Treatment:    Reason Eval/Treat Not Completed: (P) Patient declined, no reason specified Pt requesting to speak with MD about discharging from hospital. Will continue efforts per PT plan of care as schedule permits if pt remains in hospital.   Mariel Shope [facility] 10/22/2023, 5:58 PM

## 2023-10-22 NOTE — Progress Notes (Signed)
 [facility] ROUNDING NOTE   Subjective: Patient feeling today, sitting up in bed.  No complaints.  Labs continue to improve   Objective: Vital signs in last 24 hours: Temp:  [97 F (36.1 C)-98.6 F (37 C)] 98.3 F (36.8 C) (05/23 1100) Pulse Rate:  [73-181] 181 (05/23 1100) Resp:  [13-33] 25 (05/23 1100) BP: (77-104)/(60-81) 98/75 (05/23 1100) SpO2:  [88 %-100 %] 96 % (05/23 1100) Last BM Date : 10/22/23 General: NAD, pleasant Af male Lungs:  CTA b/l, no w/r/r Heart:  RRR, no m/r/g Abdomen:  Soft, NT, ND, +BS Ext:  No c/c/e    Intake/Output from previous day: 05/22 0701 - 05/23 0700 In: 50.5 [IV Piggyback:50.5] Out: 2350 [Urine:2350] Intake/Output this shift: Total I/O In: -  Out: 700 [Urine:700]   Lab Results: Recent Labs    10/22/23 0525  WBC 4.6  HGB 10.0*  PLT 177  MCV 83.9   BMET Recent Labs    10/20/23 0614 10/21/23 0342 10/22/23 0525  NA 128* 130* 132*  K 4.3 3.9 3.5  CL 93* 93* 96*  CO2 25 26 28   GLUCOSE 116* 119* 109*  BUN 40* 36* 31*  CREATININE 1.49* 1.25* 1.10  CALCIUM 8.1* 8.5* 8.4*   LFT Recent Labs    10/20/23 0614 10/21/23 0342 10/22/23 0525  PROT 6.0* 6.0* 5.8*  ALBUMIN 2.7* 2.7* 2.5*  AST 318* 239* 162*  ALT 290* 243* 192*  ALKPHOS 100 95 93  BILITOT 3.3* 2.6* 2.2*   PT/INR Recent Labs    10/21/23 0342 10/22/23 0525  INR 2.3* 1.9*      Imaging/Other results: No results found.    Assessment and Plan:   33 year old male with long history of alcohol abuse, admitted with altered mental status, severe hyponatremia, and elevated liver enzymes (AST 1600, ALT 700, T. 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   Alcoholic hepatitis Discriminant function 61 on 5/17, started on prednisolone  Lille score 0.018 (favorable) Ultrasound with trace perihepatic ascites, inadequate for paracentesis.   Steatosis, but no evidence of cirrhosis. Evaluation for other causes of liver disease unremarkable other than low titer anti-smooth muscle antibody, IgG levels normal.  Based on patient's history of heavy alcohol use, AST to ALT ratio and low titer antibody level, low suspicion for autoimmune hepatitis. - Lille score suggestive of steroid benefit;  Plan for 28 day total course of prednisolone  40 mg, followed by 20 mg x1 week, then 10 mg x 1 week. -Monitor p.o. intake, consider enteral feeding for nutrition support which is critical for recovering from alcoholic hepatitis - Patient again counseled on seriousness of alcoholic hepatitis, and high mortality risk in the setting of ongoing alcohol use    Encephalopathy Mental status waxed and waned, complicated by symptoms of alcohol withdrawal, somnolence from Ativan , also with likely underlying hepatic encephalopathy given presence of asterixis and elevated ammonia.  Mental status has improved significantly in the past 48 hours, normal on my exam today, but faint asterixis still present - Continue lactulose  twice daily, titrate to 3 bowel movements daily   Coagulopathy Likely secondary to liver dysfunction +-/ Vit K deficiency - INR 1.9 down from 2.7 2 days ago, s/p 3 days Vit K IV   Acute kidney injury Likely prerenal in the setting of severe systolic dysfunction and diuretic therapy Continues to improve, 1.1 down from 2 on 5/20 - Diuresis per cardiology; agree with adding spironolactone  Severe cardiomyopathy (  EF less than 20%, severe global hypokinesis) Cardiology evaluated, 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 tach, hypotension limiting goal-directed medical therapy   Anemia, iron deficiency - No overt GI bleeding - Consider outpatient endoscopy if not improving once acute alcoholic hepatitis resolved  Alcohol dependency/depression - Strongly recommend formal alcohol rehabilitation,  close follow up with BH to manage underlying mood disorders   Ok for discharge from GI/liver standpoint. GI will sign off at this time.   Recommend repeat labs 2 weeks following discharge Will arrange outpatient follow up with Dr. Monty App, MD  10/22/2023, 12:12 PM [facility]

## 2023-10-22 NOTE — Telephone Encounter (Signed)
 Labs ordered. Pt will be contacted once discharged from the hospital.
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
## 2023-10-22 NOTE — Plan of Care (Signed)

## 2023-10-22 NOTE — Progress Notes (Signed)
   Patient Name: Bradley Cummings Date of Encounter: 10/22/2023 [facility] Cardiologist: None   Interval Summary  .    Resting comfortably  Vital Signs .    Vitals:   10/22/23 0356 10/22/23 0400 10/22/23 0500 10/22/23 0600  BP:  101/77 (!) 87/60 101/73  Pulse:  85 81 83  Resp:  (!) 25 (!) 27 (!) 27  Temp: 98.1 F (36.7 C)     TempSrc: Oral     SpO2:  97% 97% (!) 88%  Weight:      Height:        Intake/Output Summary (Last 24 hours) at 10/22/2023 0758 Last data filed at 10/22/2023 0300 Gross per 24 hour  Intake 50.5 ml  Output 2350 ml  Net -2299.5 ml      10/21/2023   11:59 AM 10/20/2023    5:00 AM 10/19/2023    7:29 AM  Last 3 Weights  Weight (lbs) 155 lb 13.1 oz 122 lb 9.2 oz 121 lb 7.6 oz  Weight (kg) 70.68 kg 55.6 kg 55.1 kg           Physical Exam .   GEN: No acute distress.   Neck: No JVD Cardiac: RRR, no murmurs, rubs, or gallops.  Respiratory: Clear to auscultation bilaterally. GI: Soft, nontender, non-distended  MS: No edema  Assessment & Plan .     33 year old male with acute on chronic systolic heart failure EF 20%, acute alcohol hepatitis, hyponatremia, encephalopathy  Transfer to hospitalist service on 5/19.  -Lasix  40 mg IV has been administered over the last 2 days, excellent diuresis of 2.3 L yesterday.  Creatinine remaining stable at 1.1.  In fact down from 1.9.  Lets go ahead and give Lasix  again today.  Will give potassium supplementation. -Pressure has been soft.  Next agent I would like to add his spironolactone given his underlying liver dysfunction as well as systolic heart failure. -Continue with low-dose metoprolol  12.5 mg twice a day.  Brief runs of nonsustained VT noted occasionally.  Asymptomatic  -Serum sodium now 132.  Marked improvement. -INR 1.9, also improved indicating improved liver performance. -Prednisolone  40 mg Protonix , thiamine -alcoholic hepatitis.  For questions or updates, please contact [facility] Please consult [email or web address] for contact info under        Signed, Dorothye Gathers, MD

## 2023-10-22 NOTE — Telephone Encounter (Signed)
-----   Message from [place] H sent at 10/22/2023  1:06 PM EDT ----- Regarding: FW: Hospital follow up Pod B Triage,   Can you place orders for labs in 2 weeks please.   Thank you I have scheduled patient for follow up appt on 11/16/23 with Deanna May, NP. ----- Message ----- From: Elois Hair, MD Sent: 10/22/2023  12:29 PM EDT To: Fraser Jackson; Truddie Furrow, MD Subject: Hospital follow up                             [facility],  Can you please arrange outpatient follow up with Dr. Yvone Herd in the next 4-6 weeks?  Would like to repeat some labs in 2 weeks (CBC, CMP, INR, GGT).  NM,  This is the young male with alcoholic hepatitis and alcohol induced cardiomyopathy you saw.  Treating with prednisolone  (40mg  x 4 weeks, 20 mg x1 week, 10 mg x 1 week) Labs were all heading in the right direction, but still quite ill.

## 2023-10-23 ENCOUNTER — Other Ambulatory Visit (HOSPITAL_COMMUNITY): Payer: Self-pay

## 2023-10-23 DIAGNOSIS — E871 Hypo-osmolality and hyponatremia: Secondary | ICD-10-CM | POA: Diagnosis not present

## 2023-10-23 DIAGNOSIS — I5023 Acute on chronic systolic (congestive) heart failure: Secondary | ICD-10-CM | POA: Diagnosis not present

## 2023-10-23 LAB — GLUCOSE, CAPILLARY
Glucose-Capillary: 113 mg/dL — ABNORMAL HIGH (ref 70–99)
Glucose-Capillary: 144 mg/dL — ABNORMAL HIGH (ref 70–99)

## 2023-10-23 LAB — COMPREHENSIVE METABOLIC PANEL WITH GFR
ALT: 164 U/L — ABNORMAL HIGH (ref 0–44)
AST: 123 U/L — ABNORMAL HIGH (ref 15–41)
Albumin: 2.5 g/dL — ABNORMAL LOW (ref 3.5–5.0)
Alkaline Phosphatase: 89 U/L (ref 38–126)
Anion gap: 7 (ref 5–15)
BUN: 23 mg/dL — ABNORMAL HIGH (ref 6–20)
CO2: 27 mmol/L (ref 22–32)
Calcium: 8.3 mg/dL — ABNORMAL LOW (ref 8.9–10.3)
Chloride: 97 mmol/L — ABNORMAL LOW (ref 98–111)
Creatinine, Ser: 1.04 mg/dL (ref 0.61–1.24)
GFR, Estimated: >60 mL/min (ref >60)
Glucose, Bld: 139 mg/dL — ABNORMAL HIGH (ref 70–99)
Potassium: 3.9 mmol/L (ref 3.5–5.1)
Sodium: 131 mmol/L — ABNORMAL LOW (ref 135–145)
Total Bilirubin: 2 mg/dL — ABNORMAL HIGH (ref 0.0–1.2)
Total Protein: 5.9 g/dL — ABNORMAL LOW (ref 6.5–8.1)

## 2023-10-23 LAB — MAGNESIUM: Magnesium: 2 mg/dL (ref 1.7–2.4)

## 2023-10-23 MED ORDER — FOLIC ACID 1 MG PO TABS
1.0000 mg | ORAL_TABLET | Freq: Every day | ORAL | 1 refills | Status: DC
  Filled 2023-10-23: qty 30, 30d supply, fill #0
  Filled 2023-11-15: qty 30, 30d supply, fill #1

## 2023-10-23 MED ORDER — ESCITALOPRAM OXALATE 5 MG PO TABS
5.0000 mg | ORAL_TABLET | Freq: Every day | ORAL | 0 refills | Status: DC
  Filled 2023-10-23: qty 30, 30d supply, fill #0

## 2023-10-23 MED ORDER — LACTULOSE 10 GM/15ML PO SOLN
30.0000 g | Freq: Two times a day (BID) | ORAL | 0 refills | Status: DC
  Filled 2023-10-23: qty 237, 3d supply, fill #0

## 2023-10-23 MED ORDER — ARTIFICIAL TEARS OPHTHALMIC OINT
TOPICAL_OINTMENT | OPHTHALMIC | 0 refills | Status: AC | PRN
  Filled 2023-10-23: qty 3.5, fill #0

## 2023-10-23 MED ORDER — VITAMIN D (ERGOCALCIFEROL) 1.25 MG (50000 UNIT) PO CAPS
50000.0000 [IU] | ORAL_CAPSULE | ORAL | 1 refills | Status: DC
  Filled 2023-10-23: qty 4, 28d supply, fill #0
  Filled 2023-11-15: qty 4, 28d supply, fill #1

## 2023-10-23 MED ORDER — THIAMINE HCL 100 MG PO TABS
100.0000 mg | ORAL_TABLET | Freq: Every day | ORAL | 1 refills | Status: DC
  Filled 2023-10-23: qty 30, 30d supply, fill #0
  Filled 2023-11-15: qty 30, 30d supply, fill #1

## 2023-10-23 MED ORDER — TROLAMINE SALICYLATE 10 % EX CREA
TOPICAL_CREAM | Freq: Two times a day (BID) | CUTANEOUS | 0 refills | Status: DC | PRN
  Filled 2023-10-23: qty 85, fill #0

## 2023-10-23 MED ORDER — PANTOPRAZOLE SODIUM 40 MG PO TBEC
40.0000 mg | DELAYED_RELEASE_TABLET | Freq: Every day | ORAL | 1 refills | Status: DC
  Filled 2023-10-23: qty 30, 30d supply, fill #0
  Filled 2023-11-15: qty 30, 30d supply, fill #1

## 2023-10-23 MED ORDER — PREDNISOLONE SODIUM PHOSPHATE 15 MG/5ML PO SOLN
ORAL | 0 refills | Status: DC
Start: 2023-10-23 — End: 2024-01-25
  Filled 2023-10-23: qty 280, 21d supply, fill #0
  Filled 2023-10-23: qty 40, 5d supply, fill #0
  Filled 2023-10-23: qty 210, fill #0

## 2023-10-23 MED ORDER — ADULT MULTIVITAMIN W/MINERALS CH
1.0000 | ORAL_TABLET | Freq: Every day | ORAL | 1 refills | Status: DC
  Filled 2023-10-23: qty 30, 30d supply, fill #0
  Filled 2023-11-15: qty 30, 30d supply, fill #1

## 2023-10-23 MED ORDER — METOPROLOL SUCCINATE ER 25 MG PO TB24
25.0000 mg | ORAL_TABLET | Freq: Every day | ORAL | 1 refills | Status: DC
  Filled 2023-10-23: qty 30, 30d supply, fill #0
  Filled 2023-11-15: qty 30, 30d supply, fill #1

## 2023-10-23 MED ORDER — FUROSEMIDE 20 MG PO TABS
20.0000 mg | ORAL_TABLET | Freq: Every day | ORAL | 1 refills | Status: DC | PRN
  Filled 2023-10-23: qty 30, 30d supply, fill #0
  Filled 2023-11-15: qty 30, 30d supply, fill #1

## 2023-10-23 MED ORDER — NICOTINE 14 MG/24HR TD PT24
14.0000 mg | MEDICATED_PATCH | Freq: Every day | TRANSDERMAL | 0 refills | Status: DC
  Filled 2023-10-23: qty 28, 28d supply, fill #0

## 2023-10-23 MED ORDER — ENSURE ENLIVE PO LIQD
237.0000 mL | Freq: Two times a day (BID) | ORAL | 12 refills | Status: DC
  Filled 2023-10-23: qty 237, 1d supply, fill #0

## 2023-10-23 MED ORDER — METOPROLOL SUCCINATE ER 25 MG PO TB24: 25.0000 mg | ORAL_TABLET | Freq: Every day | ORAL | Status: DC

## 2023-10-23 NOTE — Progress Notes (Signed)
   Patient Name: Bradley Cummings Date of Encounter: 10/23/2023 [facility] Cardiologist: None Skains  Interval Summary  .    Resting no complaints family at bedside  Vital Signs .    Vitals:   10/23/23 0512 10/23/23 0539 10/23/23 0801 10/23/23 1130  BP: 94/71  103/74 94/75  Pulse: 90  83 89  Resp:      Temp: 97.8 F (36.6 C)  97.8 F (36.6 C) 97.7 F (36.5 C)  TempSrc: Oral  Oral Oral  SpO2: 100%  100% 100%  Weight:  69.9 kg    Height:        Intake/Output Summary (Last 24 hours) at 10/23/2023 1151 Last data filed at 10/23/2023 1131 Gross per 24 hour  Intake --  Output 1300 ml  Net -1300 ml      10/23/2023    5:39 AM 10/21/2023   11:59 AM 10/20/2023    5:00 AM  Last 3 Weights  Weight (lbs) 154 lb 1.6 oz 155 lb 13.1 oz 122 lb 9.2 oz  Weight (kg) 69.9 kg 70.68 kg 55.6 kg          Tele: SR  Physical Exam .   GEN: No acute distress.   Neck: No JVD Cardiac: RRR, no murmurs, rubs, or gallops.  Respiratory: Clear to auscultation bilaterally. GI: Soft, nontender, non-distended  MS: No edema  Assessment & Plan .     33 year old male with acute on chronic systolic heart failure EF 20%, acute alcohol hepatitis, hyponatremia, encephalopathy  -Lasix  40 mg IV has been administered,  excellent diuresis of 2.3 L yesterday.  Creatinine remaining stable at 1.04   -Pressure has been soft.  Next agent I would like to add his spironolactone given his underlying liver dysfunction as well as systolic heart failure. -Continue with low-dose metoprolol  12.5 mg twice a day.  Brief runs of nonsustained VT noted previously, tele quiet now. Transition to metoprolol  succ 25 mg daily.  -Serum sodium now 131.  Marked improvement. -INR 1.9, also improved indicating improved liver performance.  Pt eager to go home today. Stable from CV perspective but GDMT has not been able to be titrated. He does have close follow up.  May be reasonable to dc home on: - lasix  20 mg daily PRN  SWELLING/SOB, potassium 20 meq daily PRN lasix  use,  - metoprolol  succ 25 mg daily, and titrate further HF therapy as outpatient, due to hypotension   For questions or updates, please contact [facility] Please consult [email or web address] for contact info under        Signed, Telesa Jeancharles A Brynnlie Unterreiner, MD

## 2023-10-23 NOTE — Discharge Summary (Signed)
 Physician Discharge Summary  Patient ID: Nial Hawe MRN: [id number] DOB/AGE: 1990-08-07 33 y.o.  Admit date: 10/15/2023 Discharge date: 10/23/2023  Admission Diagnoses:  Discharge Diagnoses:  Principal Problem:   Hyponatremia Active Problems:   Alcohol use disorder, moderate, dependence (HCC)   AMS (altered mental status)   Alcoholic hepatitis   Major depressive disorder, recurrent episode, moderate (HCC)   Congestive heart failure (HCC)   Moderate mitral valve regurgitation   Biatrial enlargement   AKI (acute kidney injury) (HCC)   Acute metabolic encephalopathy   Alcohol abuse   Malnutrition of moderate degree   Iron deficiency anemia   Discharged Condition: {condition:18240}  Hospital Course: ***  Consults: {consultation:18241}  Significant Diagnostic Studies: {diagnostics:18242}  Treatments: {Tx:18249}  Discharge Exam: Blood pressure 94/75, pulse 89, temperature 97.7 F (36.5 C), temperature source Oral, resp. rate 18, height 5\' 10"  (1.778 m), weight 69.9 kg, SpO2 100%. {physical WUJW:1191478}  Disposition: Discharge disposition: 01-Home or Self Care       Discharge Instructions     Diet - low sodium heart healthy   Complete by: As directed    Increase activity slowly   Complete by: As directed       Allergies as of 10/23/2023   No Known Allergies      Medication List     STOP taking these medications    aspirin-acetaminophen -caffeine 250-250-65 MG tablet Commonly known as: EXCEDRIN MIGRAINE       TAKE these medications    artificial tears Oint ophthalmic ointment Commonly known as: LACRILUBE Place into both eyes every 4 (four) hours as needed for dry eyes.   escitalopram  5 MG tablet Commonly known as: LEXAPRO  Take 1 tablet (5 mg total) by mouth daily. Start taking on: Oct 24, 2023   feeding supplement Liqd Take 237 mLs by mouth 2 (two) times daily between meals.   folic acid  1 MG tablet Commonly known as: FOLVITE  Take 1  tablet (1 mg total) by mouth daily. Start taking on: Oct 24, 2023   furosemide  20 MG tablet Commonly known as: Lasix  Take 1 tablet (20 mg total) by mouth daily as needed.   lactulose  10 GM/15ML solution Commonly known as: CHRONULAC  Take 45 mLs (30 g total) by mouth 2 (two) times daily.   metoprolol  succinate 25 MG 24 hr tablet Commonly known as: TOPROL -XL Take 1 tablet (25 mg total) by mouth daily. Start taking on: Oct 24, 2023   multivitamin with minerals Tabs tablet Take 1 tablet by mouth daily. Start taking on: Oct 24, 2023   nicotine  14 mg/24hr patch Commonly known as: NICODERM CQ  - dosed in mg/24 hours Place 1 patch (14 mg total) onto the skin daily. Start taking on: Oct 24, 2023   pantoprazole  40 MG tablet Commonly known as: PROTONIX  Take 1 tablet (40 mg total) by mouth daily. Start taking on: Oct 24, 2023   prednisoLONE  5 MG Tabs tablet Prednisolone  40 Mg p.o. once daily x 3 weeks, then 20 Mg p.o. once daily x 1 week and then 10 Mg p.o. once daily x 1 week then stop.   thiamine  100 MG tablet Commonly known as: Vitamin B-1 Take 1 tablet (100 mg total) by mouth daily. Start taking on: Oct 24, 2023   trolamine salicylate 10 % cream Commonly known as: ASPERCREME Apply topically 2 (two) times daily as needed for muscle pain.   Vitamin D  (Ergocalciferol ) 1.25 MG (50000 UNIT) Caps capsule Commonly known as: DRISDOL Take 1 capsule (50,000 Units total) by mouth every  7 (seven) days. Start taking on: Oct 28, 2023         Signed: Doroteo Gasmen 10/23/2023, 1:13 PM

## 2023-10-23 NOTE — Progress Notes (Signed)
PICC line was removed per order with no complications.  Pt supine and suspended inspiration during removal with instructions to remain supine for 30 minutes after removal.  Pressure held to achieve hemostasis.  Vaseline/gauze/tegaderm applied.  Patient education provided regarding lifting restrictions, site care, and signs of infection.  Pt verbalized understanding and had no questions.

## 2023-10-23 NOTE — Progress Notes (Signed)
 Iv team consult request for PICC line removal.

## 2023-10-23 NOTE — Progress Notes (Signed)
 DISCHARGE NOTE HOME Bradley Cummings to be discharged Home per MD order. Discussed prescriptions and follow up appointments with the patient. Prescriptions given to patient; medication list explained in detail. Patient verbalized understanding.  Skin clean, dry and intact without evidence of skin break down, no evidence of skin tears noted. IV catheter discontinued intact. Site without signs and symptoms of complications. Dressing and pressure applied. Pt denies pain at the site currently. No complaints noted.  Patient free of lines, drains, and wounds.   An After Visit Summary (AVS) was printed and given to the patient. Patient escorted via wheelchair, and discharged home via private auto.  Bradley Eppes K Adilynne Fitzwater, RN

## 2023-10-26 ENCOUNTER — Other Ambulatory Visit (HOSPITAL_COMMUNITY): Payer: Self-pay

## 2023-10-28 NOTE — Telephone Encounter (Signed)
 Pt is active on mychart, message sent regarding f/u & labs.

## 2023-10-29 NOTE — Telephone Encounter (Signed)
 Left message for patient to call back

## 2023-11-03 ENCOUNTER — Encounter (HOSPITAL_COMMUNITY): Payer: Self-pay | Admitting: Emergency Medicine

## 2023-11-03 ENCOUNTER — Emergency Department (HOSPITAL_COMMUNITY)

## 2023-11-03 ENCOUNTER — Emergency Department (HOSPITAL_COMMUNITY)
Admission: EM | Admit: 2023-11-03 | Discharge: 2023-11-04 | Disposition: A | Discharge status: Home / Self Care | Attending: Emergency Medicine | Admitting: Emergency Medicine

## 2023-11-03 DIAGNOSIS — R0602 Shortness of breath: Secondary | ICD-10-CM | POA: Insufficient documentation

## 2023-11-03 DIAGNOSIS — F419 Anxiety disorder, unspecified: Secondary | ICD-10-CM

## 2023-11-03 DIAGNOSIS — I509 Heart failure, unspecified: Secondary | ICD-10-CM | POA: Diagnosis not present

## 2023-11-03 DIAGNOSIS — Z79899 Other long term (current) drug therapy: Secondary | ICD-10-CM | POA: Diagnosis not present

## 2023-11-03 DIAGNOSIS — G479 Sleep disorder, unspecified: Secondary | ICD-10-CM | POA: Insufficient documentation

## 2023-11-03 DIAGNOSIS — R600 Localized edema: Secondary | ICD-10-CM | POA: Diagnosis not present

## 2023-11-03 LAB — CBC
HCT: 41.6 % (ref 39.0–52.0)
Hemoglobin: 14.1 g/dL (ref 13.0–17.0)
MCH: 28.4 pg (ref 26.0–34.0)
MCHC: 33.9 g/dL (ref 30.0–36.0)
MCV: 83.9 fL (ref 80.0–100.0)
Platelets: 113 10*3/uL — ABNORMAL LOW (ref 150–400)
RBC: 4.96 MIL/uL (ref 4.22–5.81)
RDW: 21.7 % — ABNORMAL HIGH (ref 11.5–15.5)
WBC: 6.7 10*3/uL (ref 4.0–10.5)
nRBC: 0 % (ref 0.0–0.2)

## 2023-11-03 LAB — BASIC METABOLIC PANEL WITH GFR
Anion gap: 13 (ref 5–15)
BUN: 14 mg/dL (ref 6–20)
CO2: 25 mmol/L (ref 22–32)
Calcium: 8.8 mg/dL — ABNORMAL LOW (ref 8.9–10.3)
Chloride: 104 mmol/L (ref 98–111)
Creatinine, Ser: 0.96 mg/dL (ref 0.61–1.24)
GFR, Estimated: >60 mL/min (ref >60)
Glucose, Bld: 85 mg/dL (ref 70–99)
Potassium: 3.8 mmol/L (ref 3.5–5.1)
Sodium: 142 mmol/L (ref 135–145)

## 2023-11-03 LAB — BRAIN NATRIURETIC PEPTIDE: B Natriuretic Peptide: 2549.6 pg/mL — ABNORMAL HIGH (ref 0.0–100.0)

## 2023-11-03 MED ORDER — FUROSEMIDE 10 MG/ML IJ SOLN
40.0000 mg | Freq: Once | INTRAMUSCULAR | Status: AC
  Administered 2023-11-03: 40 mg via INTRAVENOUS
  Filled 2023-11-03: qty 4

## 2023-11-03 MED ORDER — LORAZEPAM 2 MG/ML IJ SOLN: 0.0000 mg | Freq: Two times a day (BID) | INTRAMUSCULAR | Status: DC

## 2023-11-03 MED ORDER — LORAZEPAM 2 MG/ML IJ SOLN: 0.0000 mg | Freq: Four times a day (QID) | INTRAMUSCULAR | Status: DC

## 2023-11-03 MED ORDER — LORAZEPAM 1 MG PO TABS: 0.0000 mg | ORAL_TABLET | Freq: Four times a day (QID) | ORAL | Status: DC

## 2023-11-03 MED ORDER — LORAZEPAM 1 MG PO TABS: 0.0000 mg | ORAL_TABLET | Freq: Two times a day (BID) | ORAL | Status: DC

## 2023-11-03 MED ORDER — LORAZEPAM 2 MG/ML IJ SOLN
2.0000 mg | Freq: Once | INTRAMUSCULAR | Status: AC
  Administered 2023-11-03: 2 mg via INTRAVENOUS
  Filled 2023-11-03: qty 1

## 2023-11-03 NOTE — ED Notes (Signed)
 Pt was walking out of ED when ED staff tried to stop him to stay and be seen by a doctor, ED staff encouraged family to ask pt to stay.

## 2023-11-03 NOTE — ED Triage Notes (Signed)
 Pt present with BLE swelling x 3 days. Pt was recently in hospital for 8 days with heart failure. Pt states slight chest discomfort and SOB with exertion.

## 2023-11-03 NOTE — ED Provider Triage Note (Signed)
 Emergency Medicine Provider Triage Evaluation Note  Dacotah Cabello , a 33 y.o. male  was evaluated in triage.  Pt complains of shortness of breath and feeling shaky.  Pt complains of leg swelling.    Review of Systems  Positive: weakness Negative:   Physical Exam  BP (!) 114/90   Pulse (!) 106   Temp 98.3 F (36.8 C)   Resp 16   Wt 69 kg   SpO2 100%   BMI 21.83 kg/m  Gen:   Awake, no distress   Resp:  Normal effort  MSK:   Large amount of swelling bilat legs.  Other:    Medical Decision Making  Medically screening exam initiated at 9:52 PM.  Appropriate orders placed.  Harman Hassell was informed that the remainder of the evaluation will be completed by another provider, this initial triage assessment does not replace that evaluation, and the importance of remaining in the ED until their evaluation is complete.  Pt may be having withdrawal     Jazzma Neidhardt K, PA-C 11/03/23 2158

## 2023-11-04 LAB — HEPATIC FUNCTION PANEL
ALT: 49 U/L — ABNORMAL HIGH (ref 0–44)
AST: 39 U/L (ref 15–41)
Albumin: 2.5 g/dL — ABNORMAL LOW (ref 3.5–5.0)
Alkaline Phosphatase: 67 U/L (ref 38–126)
Bilirubin, Direct: 0.6 mg/dL — ABNORMAL HIGH (ref 0.0–0.2)
Indirect Bilirubin: 1.2 mg/dL — ABNORMAL HIGH (ref 0.3–0.9)
Total Bilirubin: 1.8 mg/dL — ABNORMAL HIGH (ref 0.0–1.2)
Total Protein: 5.5 g/dL — ABNORMAL LOW (ref 6.5–8.1)

## 2023-11-04 LAB — RAPID URINE DRUG SCREEN, HOSP PERFORMED
Amphetamines: NOT DETECTED
Barbiturates: NOT DETECTED
Benzodiazepines: NOT DETECTED
Cocaine: NOT DETECTED
Opiates: NOT DETECTED
Tetrahydrocannabinol: NOT DETECTED

## 2023-11-04 LAB — AMMONIA: Ammonia: 19 umol/L (ref 9–35)

## 2023-11-04 LAB — ETHANOL: Alcohol, Ethyl (B): <15 mg/dL (ref <15)

## 2023-11-04 MED ORDER — HYDROXYZINE HCL 25 MG PO TABS: 12.5000 mg | ORAL_TABLET | Freq: Three times a day (TID) | ORAL | 0 refills | Status: DC | PRN

## 2023-11-04 NOTE — Telephone Encounter (Signed)
Patient is currently at [facility] ED.  

## 2023-11-05 NOTE — ED Provider Notes (Signed)
 Bradley Cummings   CSN: [id number] Arrival date & time: 11/03/23  2106     History  Chief Complaint  Patient presents with   Leg Swelling   Shortness of Breath    Bradley Cummings is a 33 y.o. male.  33 year old male with a history of pretty severe heart failure that presents the ER today with multiple symptoms.  Overall it seems that his main issue is anxiety and difficulty sleeping.  He has a history of benzodiazepine abuse, alcohol abuse and anxiety/depression. Recently d/c from hospital for CHF exacerbation. States he does have some LE edema worse than d/c but much better than previously. No sob. No orthopnea. No cough. No si/hi just feels he has way too much energy to sleep.    Shortness of Breath      Home Medications Prior to Admission medications   Medication Sig Start Date End Date Taking? Authorizing Provider  artificial tears (LACRILUBE) OINT ophthalmic ointment Place into both eyes every 4 (four) hours as needed for dry eyes. 10/23/23  Yes Doroteo Gasmen, MD  escitalopram  (LEXAPRO ) 5 MG tablet Take 1 tablet (5 mg total) by mouth daily. 10/24/23  Yes Fonnie Iba I, MD  folic acid  (FOLVITE ) 1 MG tablet Take 1 tablet (1 mg total) by mouth daily. 10/24/23  Yes Doroteo Gasmen, MD  furosemide  (LASIX ) 20 MG tablet Take 1 tablet (20 mg total) by mouth daily as needed. 10/23/23 10/22/24 Yes Doroteo Gasmen, MD  hydrOXYzine  (ATARAX ) 25 MG tablet Take 0.5 tablets (12.5 mg total) by mouth every 8 (eight) hours as needed for anxiety (or sleep). 11/04/23  Yes Dacen Frayre, Reymundo Caulk, MD  lactulose  (CHRONULAC ) 10 GM/15ML solution Take 45 mLs (30 g total) by mouth 2 (two) times daily. 10/23/23  Yes Doroteo Gasmen, MD  metoprolol  succinate (TOPROL -XL) 25 MG 24 hr tablet Take 1 tablet (25 mg total) by mouth daily. 10/24/23  Yes Doroteo Gasmen, MD  Multiple Vitamin (MULTIVITAMIN WITH MINERALS) TABS tablet Take 1 tablet by mouth  daily. 10/24/23  Yes Doroteo Gasmen, MD  pantoprazole  (PROTONIX ) 40 MG tablet Take 1 tablet (40 mg total) by mouth daily. 10/24/23  Yes Doroteo Gasmen, MD  thiamine  (VITAMIN B1) 100 MG tablet Take 1 tablet (100 mg total) by mouth daily. 10/24/23  Yes Fonnie Iba I, MD  Vitamin D , Ergocalciferol , (DRISDOL ) 1.25 MG (50000 UNIT) CAPS capsule Take 1 capsule (50,000 Units total) by mouth every 7 (seven) days. 10/28/23  Yes Doroteo Gasmen, MD  feeding supplement (ENSURE ENLIVE / ENSURE PLUS) LIQD Take 237 mLs by mouth 2 (two) times daily between meals. Patient not taking: Reported on 11/04/2023 10/23/23   Fonnie Iba I, MD  nicotine  (NICODERM CQ  - DOSED IN MG/24 HOURS) 14 mg/24hr patch Place 1 patch (14 mg total) onto the skin daily. Patient not taking: Reported on 11/04/2023 10/24/23   Fonnie Iba I, MD  prednisoLONE  (ORAPRED ) 15 MG/5ML solution Take 13.3 mLs (40 mg) by mouth daily for 21 days Patient not taking: Reported on 11/04/2023 10/23/23     trolamine salicylate (ASPERCREME) 10 % cream Apply topically 2 (two) times daily as needed for muscle pain. Patient not taking: Reported on 11/04/2023 10/23/23   Doroteo Gasmen, MD      Allergies    Patient has no known allergies.    Review of Systems   Review of Systems  Respiratory:  Positive for shortness of breath.  Physical Exam Updated Vital Signs BP (!) 87/62   Pulse 73   Temp 98.1 F (36.7 C) (Oral)   Resp 15   Ht 5\' 10"  (1.778 m)   Wt 69 kg   SpO2 100%   BMI 21.83 kg/m  Physical Exam Vitals and nursing Cummings reviewed.  Constitutional:      Appearance: He is well-developed.  HENT:     Head: Normocephalic and atraumatic.  Cardiovascular:     Rate and Rhythm: Normal rate.  Pulmonary:     Effort: Pulmonary effort is normal. No respiratory distress.  Abdominal:     General: There is no distension.  Musculoskeletal:        General: Normal range of motion.     Cervical back: Normal range of motion.      Right lower leg: Edema present.     Left lower leg: Edema present.  Neurological:     General: No focal deficit present.     Mental Status: He is alert.  Psychiatric:        Mood and Affect: Mood is anxious.     ED Results / Procedures / Treatments   Labs (all labs ordered are listed, but only abnormal results are displayed) Labs Reviewed  BASIC METABOLIC PANEL WITH GFR - Abnormal; Notable for the following components:      Result Value   Calcium 8.8 (*)    All other components within normal limits  CBC - Abnormal; Notable for the following components:   RDW 21.7 (*)    Platelets 113 (*)    All other components within normal limits  BRAIN NATRIURETIC PEPTIDE - Abnormal; Notable for the following components:   B Natriuretic Peptide 2,549.6 (*)    All other components within normal limits  HEPATIC FUNCTION PANEL - Abnormal; Notable for the following components:   Total Protein 5.5 (*)    Albumin 2.5 (*)    ALT 49 (*)    Total Bilirubin 1.8 (*)    Bilirubin, Direct 0.6 (*)    Indirect Bilirubin 1.2 (*)    All other components within normal limits  RAPID URINE DRUG SCREEN, HOSP PERFORMED  ETHANOL  AMMONIA    EKG EKG Interpretation Date/Time:  Wednesday November 03 2023 21:40:34 EDT Ventricular Rate:  109 PR Interval:  160 QRS Duration:  86 QT Interval:  310 QTC Calculation: 417 R Axis:   149  Text Interpretation: Sinus tachycardia Right atrial enlargement Right axis deviation Nonspecific ST abnormality Abnormal ECG When compared with ECG of 15-Oct-2023 12:36, PREVIOUS ECG IS PRESENT Confirmed by Eve Hinders [phone]) on 11/03/2023 10:55:37 PM  Radiology DG Chest 2 View Result Date: 11/03/2023 CLINICAL DATA:  Dyspnea EXAM: CHEST - 2 VIEW COMPARISON:  10/16/2023 FINDINGS: Stable moderate cardiomegaly. Pulmonary vascularity is normal. Lungs are clear. No pneumothorax or pleural effusion. No acute bone abnormality. IMPRESSION: 1. Stable cardiomegaly. Electronically Signed   By:  Worthy Heads M.D.   On: 11/03/2023 22:04    Procedures Procedures    Medications Ordered in ED Medications  LORazepam  (ATIVAN ) injection 2 mg (2 mg Intravenous Given 11/03/23 2332)  furosemide  (LASIX ) injection 40 mg (40 mg Intravenous Given 11/03/23 2332)    ED Course/ Medical Decision Making/ A&P                                 Medical Decision Making Amount and/or Complexity of Data Reviewed Labs: ordered. Radiology: ordered.  Risk Prescription drug management.   BNP improved from hospitalization. No respiratory issues right now. Main issue is his anxiety/insomnia. Will try hydroxyzine  as he has a h/o benzo abuse/dependence. Was able to sleep some in the ED. Will follow up with cards as scheduled. Will follow up with PCP for further help with anxiety/sleep issues.   BP soft but more chronic in nature and related to his heart failure.    Final Clinical Impression(s) / ED Diagnoses Final diagnoses:  Peripheral edema  Anxiety    Rx / DC Orders ED Discharge Orders          Ordered    hydrOXYzine  (ATARAX ) 25 MG tablet  Every 8 hours PRN        11/04/23 0548              Karmah Potocki, MD 11/05/23 0321

## 2023-11-05 NOTE — Telephone Encounter (Signed)
 Left message for patient to call back

## 2023-11-09 ENCOUNTER — Encounter: Payer: Self-pay | Admitting: Physician Assistant

## 2023-11-09 ENCOUNTER — Ambulatory Visit (INDEPENDENT_AMBULATORY_CARE_PROVIDER_SITE_OTHER): Admitting: Physician Assistant

## 2023-11-09 VITALS — BP 99/73 | HR 63 | Ht 71.0 in | Wt 163.6 lb

## 2023-11-09 DIAGNOSIS — F17210 Nicotine dependence, cigarettes, uncomplicated: Secondary | ICD-10-CM

## 2023-11-09 DIAGNOSIS — Z720 Tobacco use: Secondary | ICD-10-CM

## 2023-11-09 DIAGNOSIS — F1020 Alcohol dependence, uncomplicated: Secondary | ICD-10-CM | POA: Diagnosis not present

## 2023-11-09 DIAGNOSIS — F331 Major depressive disorder, recurrent, moderate: Secondary | ICD-10-CM

## 2023-11-09 DIAGNOSIS — Z09 Encounter for follow-up examination after completed treatment for conditions other than malignant neoplasm: Secondary | ICD-10-CM | POA: Diagnosis not present

## 2023-11-09 DIAGNOSIS — I509 Heart failure, unspecified: Secondary | ICD-10-CM | POA: Diagnosis not present

## 2023-11-09 DIAGNOSIS — D508 Other iron deficiency anemias: Secondary | ICD-10-CM

## 2023-11-09 MED ORDER — HYDROXYZINE HCL 25 MG PO TABS: 12.5000 mg | ORAL_TABLET | Freq: Three times a day (TID) | ORAL | 1 refills | Status: DC | PRN

## 2023-11-09 MED ORDER — BUPROPION HCL ER (SR) 100 MG PO TB12: 100.0000 mg | ORAL_TABLET | Freq: Two times a day (BID) | ORAL | 2 refills | Status: DC
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
## 2023-11-09 NOTE — Progress Notes (Deleted)
 Patient ID: Bradley Cummings, male   DOB: Jun 23, 1990, 33 y.o.   MRN: [id number]

## 2023-11-09 NOTE — Patient Instructions (Addendum)
 [place].org is the website for narcotics anonymous  [employer]cy (website) or (438)[phone] is the information for alcoholics anonymous  Both are free and immediately available for help with alcohol and drug use   Take lasix (furosemide  twice daily for the next 5 days then back to once daily.    Avoid salt and sugar in your food.  Eat more fresh meats and vegetables that are prepared without salt.    Weigh yourself daily in the morning.  Contact cardiology if your weight goes more than 3 to 5 pounds overnight.     Managing the Challenge of Quitting Smoking Quitting smoking is a physical and mental challenge. You may have cravings, withdrawal symptoms, and temptation to smoke. Before quitting, work with your health care provider to make a plan that can help you manage quitting. Making a plan before you quit may keep you from smoking when you have the urge to smoke while trying to quit. How to manage lifestyle changes Managing stress Stress can make you want to smoke, and wanting to smoke may cause stress. It is important to find ways to manage your stress. You could try some of the following: Practice relaxation techniques. Breathe slowly and deeply, in through your nose and out through your mouth. Listen to music. Soak in a bath or take a shower. Imagine a peaceful place or vacation. Get some support. Talk with family or friends about your stress. Join a support group. Talk with a counselor or therapist. Get some physical activity. Go for a walk, run, or bike ride. Play a favorite sport. Practice yoga.  Medicines Talk with your health care provider about medicines that might help you deal with cravings and make quitting easier for you. Relationships Social situations can be difficult when you are quitting smoking. To manage this, you can: Avoid parties and other social situations where people might be smoking. Avoid alcohol. Leave right away if you have the urge to  smoke. Explain to your family and friends that you are quitting smoking. Ask for support and let them know you might be a bit grumpy. Plan activities where smoking is not an option. General instructions Be aware that many people gain weight after they quit smoking. However, not everyone does. To keep from gaining weight, have a plan in place before you quit, and stick to the plan after you quit. Your plan should include: Eating healthy snacks. When you have a craving, it may help to: Eat popcorn, or try carrots, celery, or other cut vegetables. Chew sugar-free gum. Changing how you eat. Eat small portion sizes at meals. Eat 4-6 small meals throughout the day instead of 1-2 large meals a day. Be mindful when you eat. You should avoid watching television or doing other things that might distract you as you eat. Exercising regularly. Make time to exercise each day. If you do not have time for a long workout, do short bouts of exercise for 5-10 minutes several times a day. Do some form of strengthening exercise, such as weight lifting. Do some exercise that gets your heart beating and causes you to breathe deeply, such as walking fast, running, swimming, or biking. This is very important. Drinking plenty of water or other low-calorie or no-calorie drinks. Drink enough fluid to keep your urine pale yellow.  How to recognize withdrawal symptoms Your body and mind may experience discomfort as you try to get used to not having nicotine  in your system. These effects are called withdrawal symptoms. They may include: Feeling hungrier  than normal. Having trouble concentrating. Feeling irritable or restless. Having trouble sleeping. Feeling depressed. Craving a cigarette. These symptoms may surprise you, but they are normal to have when quitting smoking. To manage withdrawal symptoms: Avoid places, people, and activities that trigger your cravings. Remember why you want to quit. Get plenty of  sleep. Avoid coffee and other drinks that contain caffeine. These may worsen some of your symptoms. How to manage cravings Come up with a plan for how to deal with your cravings. The plan should include the following: A definition of the specific situation you want to deal with. An activity or action you will take to replace smoking. A clear idea for how this action will help. The name of someone who could help you with this. Cravings usually last for 5-10 minutes. Consider taking the following actions to help you with your plan to deal with cravings: Keep your mouth busy. Chew sugar-free gum. Suck on hard candies or a straw. Brush your teeth. Keep your hands and body busy. Change to a different activity right away. Squeeze or play with a ball. Do an activity or a hobby, such as making bead jewelry, practicing needlepoint, or working with wood. Mix up your normal routine. Take a short exercise break. Go for a quick walk, or run up and down stairs. Focus on doing something kind or helpful for someone else. Call a friend or family member to talk during a craving. Join a support group. Contact a quitline. Where to find support To get help or find a support group: Call the National Cancer Institute's Smoking Quitline: 1-800-QUIT-NOW 4096358592) Text QUIT to SmokefreeTXT: 846962 Where to find more information Visit these websites to find more information on quitting smoking: U.S. Department of Health and Human Services: www.smokefree.gov American Lung Association: www.freedomfromsmoking.org Centers for Disease Control and Prevention (CDC): [employer]br American Heart Association: www.heart.org Contact a health care provider if: You want to change your plan for quitting. The medicines you are taking are not helping. Your eating feels out of control or you cannot sleep. You feel depressed or become very anxious. Summary Quitting smoking is a physical and mental challenge. You will face  cravings, withdrawal symptoms, and temptation to smoke again. Preparation can help you as you go through these challenges. Try different techniques to manage stress, handle social situations, and prevent weight gain. You can deal with cravings by keeping your mouth busy (such as by chewing gum), keeping your hands and body busy, calling family or friends, or contacting a quitline for people who want to quit smoking. You can deal with withdrawal symptoms by avoiding places where people smoke, getting plenty of rest, and avoiding drinks that contain caffeine. This information is not intended to replace advice given to you by your health care provider. Make sure you discuss any questions you have with your health care provider. Document Revised: 05/09/2021 Document Reviewed: 05/09/2021 Elsevier Patient Education  2024 Else[employer] Inc.  Heart Failure: Eating Plan Heart failure is a long-term condition where the heart can't pump enough blood through the body. When this happens, parts of the body don't get the blood and oxygen they need. Living with heart failure can be hard. But a healthy lifestyle and choosing the right foods may help to improve your symptoms. If you have heart failure, your eating plan will include limiting the amount of salt, also called sodium, and unhealthy fats you eat. What are tips for following this plan? Reading food labels Check food labels for the amount  of sodium per serving. Choose foods that have less than 140 mg (milligrams) of sodium in each serving. Check food labels for the number of calories per serving. This is important if you need to limit your daily calorie intake to lose weight. Check food labels for the serving size. If you eat more than one serving, you'll be eating more sodium and calories than what's listed on the label. Look for foods with the words "sodium-free," "very low sodium," or "low sodium" on the package. Foods labeled as "reduced sodium," "lightly  salted," or "no salt added" may still have more sodium than what's recommended for you. Cooking Avoid adding salt when cooking. Before using any salt substitutes, talk with your health care provider or an expert in healthy eating called a dietitian. Season food with salt-free seasonings, spices, or herbs. Check the label of seasoning mixes to make sure they don't contain salt. Cook with heart-healthy oils, such as olive, canola, soybean, or sunflower oil. Do not fry foods. Cook foods using low-fat methods, like baking, boiling, grilling, and broiling. Limit unhealthy fats when cooking. To do this: Remove the skin from poultry, such as chicken. Remove all the fat you can see on meats. Skim the fat off from stews, soups, and gravies before serving them. Meal planning  Limit your intake of: Processed, canned, or prepackaged foods. Foods that are high in trans fat, such as fried foods. Sweets, desserts, sugary drinks, and other foods with added sugar. Full-fat dairy products, such as whole milk. Eat a balanced diet. This may include: 4-5 servings of fruit each day and 4-5 servings of vegetables each day. At each meal, try to fill one-half of your plate with fruits and vegetables. Up to 6-8 servings of whole grains each day. Up to 2 servings of lean meat, poultry, or fish each day. One serving of meat is equal to 3 oz (85 g). This is about the same size as a deck of cards. 2 servings of low-fat dairy each day. Heart-healthy fats. Healthy fats called omega-3 fatty acids are a good choice. They're found in foods such as flaxseed and cold-water fish like sardines, salmon, and mackerel. Aim to eat 25-35 g (grams) of fiber a day. Foods that are high in fiber include apples, broccoli, carrots, beans, peas, and whole grains. Do not add salt or condiments that contain salt (such as soy sauce) to foods before eating. When eating at a restaurant, ask that your food be prepared with less salt or no salt, if  possible. Try to eat 2 or more plant-based or meat-free meals each week. Cook more meals at home and eat less at restaurants, buffets, and fast food places. General information Do not eat more than 2,300 mg of sodium a day. The amount of sodium that's recommended for you may be lower, depending on your condition. Stay at a healthy weight as told. Ask your provider what a healthy weight is for you. Check your weight every day. Work with your provider and dietitian to make a plan that will help you lose weight or stay at your current weight. Limit how much fluid you drink. Ask your provider or dietitian how much fluid you can have each day. Limit or avoid alcohol as told. Recommended foods Fruits All fresh, frozen, and canned fruits. Dried fruits, such as raisins, prunes, and cranberries. Vegetables All fresh vegetables. Vegetables that are frozen without sauce or added salt. Low-sodium or sodium-free canned vegetables. Grains Bread with less than 80 mg of sodium  per slice. Whole-wheat pasta, quinoa, and brown rice. Oats and oatmeal. Barley. Millet. Grits and cream of wheat. Whole-grain and whole-wheat cold cereal. Meats and other protein foods Lean cuts of meat. Skinless chicken and [place]. Fish with high omega-3 fatty acids, such as salmon, sardines, and other cold-water fishes. Eggs. Dried beans, peas, and edamame. Unsalted nuts and nut butters. Dairy Low-fat or nonfat (skim) milk and dried milk. Rice milk, soy milk, and almond milk. Low-fat or nonfat yogurt. Small amounts of reduced-sodium block cheese. Low-sodium cottage cheese. Fats and oils Olive, canola, soybean, flaxseed, avocado, or sunflower oil. Sweets and desserts Applesauce. Granola bars. Sugar-free pudding and gelatin. Frozen fruit bars. Seasoning and other foods Fresh and dried herbs. Lemon or lime juice. Vinegar. Low-sodium ketchup. Salt-free marinades, salad dressings, sauces, and seasonings. The items listed above may not  be all the foods and drinks you can have. Talk with a dietitian to learn more. Foods to avoid Fruits Fruits that are dried with preservatives that contain sodium. Vegetables Canned vegetables. Frozen vegetables with sauce or seasonings. Creamed vegetables. [place] fries. Onion rings. Pickled vegetables and sauerkraut. Grains Bread with more than 80 mg of sodium per slice. Hot or cold cereal with more than 140 mg sodium per serving. Salted pretzels and crackers. Prepackaged breadcrumbs. Bagels, croissants, and biscuits. Meats and other protein foods Ribs and chicken wings. Bacon, ham, pepperoni, bologna, salami, and packaged luncheon meats. Hot dogs, bratwurst, and sausage. Canned meat. Smoked meat and fish. Salted nuts and seeds. Dairy Whole milk, half-and-half, and cream. Buttermilk. Processed cheese, cheese spreads, and cheese curds. Regular cottage cheese. Feta cheese. Shredded cheese. String cheese. Fats and oils Butter, lard, shortening, ghee, and bacon fat. Canned and packaged gravies. Seasoning and other foods Onion salt, garlic salt, table salt, and sea salt. Marinades. Regular salad dressings. Relishes, pickles, and olives. Meat flavorings and tenderizers, and bouillon cubes. Horseradish, ketchup, and mustard. Worcestershire sauce. Teriyaki sauce, soy sauce (including reduced sodium). Hot sauce and Tabasco sauce. Steak sauce, fish sauce, oyster sauce, and cocktail sauce. Taco seasonings. Barbecue sauce. Tartar sauce. The items listed above may not be all the foods and drinks you should avoid. Talk with a dietitian to learn more. This information is not intended to replace advice given to you by your health care provider. Make sure you discuss any questions you have with your health care provider. Document Revised: 01/11/2023 Document Reviewed: 12/31/2022 Elsevier Patient Education  2024 Elsevier Inc.    Heart Failure: Helping Someone Manage Heart failure is a long-term condition  where the heart can't pump enough blood through the body. When this happens, parts of the body don't get the blood and oxygen they need. If you're taking care of a friend or family member with heart failure, you have an important role to play. As a caregiver, you provide both physical and emotional support. You'll be helping to make sure the person sticks to their schedule, takes their medicines, goes to appointments, and follows their treatment plan. How does heart failure affect the person I'm caring for? The daily routine of a person with heart failure may have many changes. The person may need to: Make diet changes. Make changes to activity and exercise routines. Take medicines on a regular basis. Have procedures or surgery. Go to cardiac rehabilitation. These programs include physical activity. What actions can I take to help someone manage this condition? Planning for discharge from the hospital or care center Before your friend or family member leaves the hospital or care  center, make sure you understand: The recovery process after heart failure. This includes any physical, emotional, or behavior changes that may occur. The medicines that the person has to take and when to take them. Whether you need to get any devices or equipment for the person. The treatments for heart failure. How to lower the person's risk of having other heart problems. Diet and exercise changes. What symptoms to watch for and when emergency medical care is needed. How to contact the person's health care provider when you have questions about the person's care. You may want to appoint one family member or caregiver as the main contact. Providing general care and support Ask the person how you can give them support. Learn as much as you can about heart failure. Help your friend or family member follow their treatment plan. This could include taking notes at health care visits and helping the person keep track of  information. Find out what extra help the person may need at home. In some cases, a person with heart failure may need help using the bathroom, bathing, or eating. Make sure you know what changes to make in the home to keep the person safe. How to care for yourself As a caregiver, taking care of yourself is also important. The goal is to meet the needs of the person with heart failure while also meeting your own needs. Remember that your support really matters. Having social support is very helpful for someone who is coping with illness. Recognizing stress It's normal to have many different emotions while caring for someone with heart failure. The changes that come with this condition can be hard and stressful. Dealing with them can lead to caregiver burnout. This is when you feel exhausted, both physically and mentally. It can happen if you feel overwhelmed. Signs of this include: Sleep problems. Trouble concentrating. Changes in appetite. Feeling depressed or moody. Not being motivated to do anything. Feeling like your emotions are out of control. Not taking care of yourself or the needs of the person with heart failure. This can include forgetting to take or give medicines or keep appointments. Taking steps to care for yourself  Monitor yourself for caregiver burnout. Ask friends and family for help. Get counseling or therapy. Take time for yourself. Relax, exercise, and eat healthy foods. Maintain a sense of self outside of being a caregiver. Get respite care for the person with heart failure. Respite care provides short-term care so that you can have regular breaks from the stress of caregiving. Where to find support  Ask the person's provider for help. They can refer you to helpful services or resources. Think about taking classes to learn more about helping someone with heart failure. Take a class to get certified in CPR. Join a support group for people who are caring for someone  with heart failure. Where to find more information American Heart Association: heart.org This information is not intended to replace advice given to you by your health care provider. Make sure you discuss any questions you have with your health care provider. Document Revised: 12/31/2022 Document Reviewed: 12/31/2022 Elsevier Patient Education  2024 [employer].

## 2023-11-10 NOTE — Telephone Encounter (Signed)
 Called and spoke with patient regarding repeat labs and follow up appt. Patient knows to stop by [facility] lab this week for repeat labs prior to his f/u appt on 11/16/23. I provided patient with the office address, lab location/hours. Patient verbalized understanding and had no concerns at the end of the call.

## 2023-11-15 ENCOUNTER — Other Ambulatory Visit (INDEPENDENT_AMBULATORY_CARE_PROVIDER_SITE_OTHER)

## 2023-11-15 ENCOUNTER — Other Ambulatory Visit (HOSPITAL_COMMUNITY): Payer: Self-pay

## 2023-11-15 DIAGNOSIS — K7010 Alcoholic hepatitis without ascites: Secondary | ICD-10-CM

## 2023-11-15 LAB — COMPREHENSIVE METABOLIC PANEL WITH GFR
ALT: 21 U/L (ref 0–53)
AST: 20 U/L (ref 0–37)
Albumin: 3.7 g/dL (ref 3.5–5.2)
Alkaline Phosphatase: 78 U/L (ref 39–117)
BUN: 10 mg/dL (ref 6–23)
CO2: 30 meq/L (ref 19–32)
Calcium: 8.8 mg/dL (ref 8.4–10.5)
Chloride: 105 meq/L (ref 96–112)
Creatinine, Ser: 0.74 mg/dL (ref 0.40–1.50)
GFR: 119.89 mL/min (ref >60.00)
Glucose, Bld: 72 mg/dL (ref 70–99)
Potassium: 4.4 meq/L (ref 3.5–5.1)
Sodium: 141 meq/L (ref 135–145)
Total Bilirubin: 0.6 mg/dL (ref 0.2–1.2)
Total Protein: 6.4 g/dL (ref 6.0–8.3)

## 2023-11-15 LAB — CBC
HCT: 37 % — ABNORMAL LOW (ref 39.0–52.0)
Hemoglobin: 11.9 g/dL — ABNORMAL LOW (ref 13.0–17.0)
MCHC: 32.2 g/dL (ref 30.0–36.0)
MCV: 84.8 fl (ref 78.0–100.0)
Platelets: 204 10*3/uL (ref 150.0–400.0)
RBC: 4.36 Mil/uL (ref 4.22–5.81)
RDW: 21 % — ABNORMAL HIGH (ref 11.5–15.5)
WBC: 4.9 10*3/uL (ref 4.0–10.5)

## 2023-11-15 NOTE — Addendum Note (Signed)
 Addended by: Mirabelle Cyphers on: 11/15/2023 11:31 AM   Modules accepted: Orders

## 2023-11-16 ENCOUNTER — Other Ambulatory Visit (HOSPITAL_COMMUNITY): Payer: Self-pay

## 2023-11-16 ENCOUNTER — Encounter: Payer: Self-pay | Admitting: Gastroenterology

## 2023-11-16 ENCOUNTER — Other Ambulatory Visit (INDEPENDENT_AMBULATORY_CARE_PROVIDER_SITE_OTHER)

## 2023-11-16 ENCOUNTER — Ambulatory Visit (INDEPENDENT_AMBULATORY_CARE_PROVIDER_SITE_OTHER): Admitting: Gastroenterology

## 2023-11-16 ENCOUNTER — Ambulatory Visit: Payer: Self-pay | Admitting: Gastroenterology

## 2023-11-16 VITALS — BP 102/60 | HR 89 | Ht 71.0 in | Wt 155.0 lb

## 2023-11-16 DIAGNOSIS — F1020 Alcohol dependence, uncomplicated: Secondary | ICD-10-CM | POA: Diagnosis not present

## 2023-11-16 DIAGNOSIS — K5904 Chronic idiopathic constipation: Secondary | ICD-10-CM

## 2023-11-16 DIAGNOSIS — G9340 Encephalopathy, unspecified: Secondary | ICD-10-CM | POA: Diagnosis not present

## 2023-11-16 DIAGNOSIS — K7011 Alcoholic hepatitis with ascites: Secondary | ICD-10-CM

## 2023-11-16 DIAGNOSIS — N179 Acute kidney failure, unspecified: Secondary | ICD-10-CM

## 2023-11-16 DIAGNOSIS — D509 Iron deficiency anemia, unspecified: Secondary | ICD-10-CM

## 2023-11-16 DIAGNOSIS — D689 Coagulation defect, unspecified: Secondary | ICD-10-CM | POA: Diagnosis not present

## 2023-11-16 DIAGNOSIS — F17290 Nicotine dependence, other tobacco product, uncomplicated: Secondary | ICD-10-CM

## 2023-11-16 DIAGNOSIS — E559 Vitamin D deficiency, unspecified: Secondary | ICD-10-CM

## 2023-11-16 DIAGNOSIS — E871 Hypo-osmolality and hyponatremia: Secondary | ICD-10-CM

## 2023-11-16 DIAGNOSIS — I429 Cardiomyopathy, unspecified: Secondary | ICD-10-CM

## 2023-11-16 DIAGNOSIS — R140 Abdominal distension (gaseous): Secondary | ICD-10-CM

## 2023-11-16 LAB — GAMMA GT: GGT: 143 U/L — ABNORMAL HIGH (ref 3–90)

## 2023-11-16 LAB — IBC + FERRITIN
Ferritin: 78.8 ng/mL (ref 22.0–322.0)
Iron: 104 ug/dL (ref 42–165)
Saturation Ratios: 24.2 % (ref 20.0–50.0)
TIBC: 429.8 ug/dL (ref 250.0–450.0)
Transferrin: 307 mg/dL (ref 212.0–360.0)

## 2023-11-16 LAB — PROTIME-INR
INR: 1.2 — ABNORMAL HIGH
Prothrombin Time: 12.3 s — ABNORMAL HIGH (ref 9.0–11.5)

## 2023-11-16 MED ORDER — LACTULOSE 10 GM/15ML PO SOLN: 10.0000 g | Freq: Three times a day (TID) | ORAL | 0 refills | Status: DC | PRN

## 2023-11-16 NOTE — Progress Notes (Signed)
 Chief Complaint:hospital follow-up , alcohol hepatitis Primary GI Doctor:Dr. Yvone Herd  HPI:  33 year old male with long history of alcohol abuse, admitted on 10/15/23 with altered mental status, severe hyponatremia, and elevated liver enzymes (AST 1600, ALT 700, T. 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  GI consulted on 10/14/23 for elevated LFTs concerning for possible alcoholic hepatitis.   Laboratory studies in the ER noteworthy for sodium 114, creatinine 1.57, alk phos 128, AST 1620, ALT 694, total bilirubin 4.9 (DB 2.0, IB 2.9)   Chest x-ray with moderate to marked enlargement of the cardiac silhouette without pulmonary edema   Bedside echo reported to show cardiomyopathy with EF 20 to 25%  11/04/23 ED visit for anxiety and difficulty sleeping.States he does have some LE edema worse than d/c but much better than previously. BNP improved from hospitalization. No respiratory issues right now. Main issue is his anxiety/insomnia. Will try hydroxyzine  as he has a h/o benzo abuse/dependence. Was able to sleep some in the ED. Will follow up with cards as scheduled.    Interval History    Patient presents for evaluation after recent hospitalization. Patient is soft spoken, accompanied by his father. Mental status appears normal, father does not have any concerns. Patients only complaint today if of bloating with eating. He states drinking water helps. Patient has bowel movement every 1-2 days. He is no longer taking the lactulose  his father tells me they ran out. His appetite is good. His mother cooks and they are watching his sodium and sugar intake. No blood in stool.  He did have some issues with anxiety and insomnia and prescribed hydroxyzine  prn which helps.  Patient taking Lasix  20 mg po daily. His leg swelling has improved, but still not some bilateral edema. His father tells me his  PCP increased it to BID for a week which helped some and back to daily dose.   Patient completed steroid taper.  He smokes tobacco 2-3 per day.   Patient stopped drinking alcohol.   No family history of GI issues or CA.  Wt Readings from Last 3 Encounters:  11/16/23 155 lb (70.3 kg)  11/09/23 163 lb 9.6 oz (74.2 kg)  11/04/23 152 lb 1.9 oz (69 kg)      Past Medical History:  Diagnosis Date   Acute alcoholic hepatitis 10/15/2023   Alcohol withdrawal (HCC)    Eczema    ETOH abuse    HFrEF (heart failure with reduced ejection fraction) (HCC)    Hyponatremia with excess extracellular fluid volume 10/15/2023   MDD (major depressive disorder)    Severe alcohol dependence (HCC)    Tobacco abuse    Traumatic subdural hematoma (SDH) (HCC) 2019   Fall    History reviewed. No pertinent surgical history.  Current Outpatient Medications  Medication Sig Dispense Refill   artificial tears (LACRILUBE) OINT ophthalmic ointment Place into both eyes every 4 (four) hours as needed for dry eyes. 3.5 g 0   buPROPion  ER (WELLBUTRIN  SR) 100 MG 12 hr tablet Take 1 tablet (100 mg total) by mouth 2 (two) times daily. For help with depression and Mekaylah Klich help reduce cigarette cravings 60 tablet 2   escitalopram  (LEXAPRO ) 5 MG tablet Take 1 tablet (5 mg total) by mouth daily. 30 tablet 0   feeding supplement (ENSURE ENLIVE / ENSURE PLUS) LIQD Take 237 mLs by mouth 2 (two) times daily between meals. 237 mL 12   folic  acid (FOLVITE ) 1 MG tablet Take 1 tablet (1 mg total) by mouth daily. 30 tablet 1   furosemide  (LASIX ) 20 MG tablet Take 1 tablet (20 mg total) by mouth daily as needed. 30 tablet 1   hydrOXYzine  (ATARAX ) 25 MG tablet Take 0.5-1 tablets (12.5-25 mg total) by mouth every 8 (eight) hours as needed for anxiety (or sleep). 60 tablet 1   lactulose  (CHRONULAC ) 10 GM/15ML solution Take 45 mLs (30 g total) by mouth 2 (two) times daily. 237 mL 0   metoprolol  succinate (TOPROL -XL) 25 MG 24 hr tablet  Take 1 tablet (25 mg total) by mouth daily. 30 tablet 1   Multiple Vitamin (MULTIVITAMIN WITH MINERALS) TABS tablet Take 1 tablet by mouth daily. 30 tablet 1   nicotine  (NICODERM CQ  - DOSED IN MG/24 HOURS) 14 mg/24hr patch Place 1 patch (14 mg total) onto the skin daily. 28 patch 0   pantoprazole  (PROTONIX ) 40 MG tablet Take 1 tablet (40 mg total) by mouth daily. 30 tablet 1   prednisoLONE  (ORAPRED ) 15 MG/5ML solution Take 13.3 mLs (40 mg) by mouth daily for 21 days 280 mL 0   thiamine  (VITAMIN B1) 100 MG tablet Take 1 tablet (100 mg total) by mouth daily. 30 tablet 1   trolamine salicylate (ASPERCREME) 10 % cream Apply topically 2 (two) times daily as needed for muscle pain. 85 g 0   Vitamin D , Ergocalciferol , (DRISDOL ) 1.25 MG (50000 UNIT) CAPS capsule Take 1 capsule (50,000 Units total) by mouth every 7 (seven) days. 12 capsule 1   No current facility-administered medications for this visit.    Allergies as of 11/16/2023   (No Known Allergies)    Family History  Problem Relation Age of Onset   Mental illness Neg Hx     Review of Systems:    Constitutional: No weight loss, fever, chills, weakness or fatigue HEENT: Eyes: No change in vision               Ears, Nose, Throat:  No change in hearing or congestion Skin: No rash or itching Cardiovascular: No chest pain, chest pressure or palpitations   Respiratory: No SOB or cough Gastrointestinal: See HPI and otherwise negative Genitourinary: No dysuria or change in urinary frequency Neurological: No headache, dizziness or syncope Musculoskeletal: No new muscle or joint pain Hematologic: No bleeding or bruising Psychiatric: No history of depression or anxiety    Physical Exam:  Vital signs: BP 102/60 (BP Location: Left Arm, Patient Position: Sitting, Cuff Size: Normal)   Pulse 89   Ht 5' 11 (1.803 m)   Wt 155 lb (70.3 kg)   SpO2 99%   BMI 21.62 kg/m   Constitutional:   Pleasant male appears to be in NAD, Well developed,  Well nourished, alert and cooperative Eyes:   PEERL, EOMI. No icterus. Conjunctiva pink. Ears:  Normal auditory acuity. Respiratory: Respirations even and unlabored. Lungs clear to auscultation bilaterally.   No wheezes, crackles, or rhonchi.  Cardiovascular: Normal S1, S2. Regular rate and rhythm. No peripheral edema, cyanosis or pallor.  Gastrointestinal:  Soft, nondistended, nontender. No rebound or guarding. Normal bowel sounds. No appreciable masses or hepatomegaly. Rectal:  Not performed.  Msk:  Symmetrical without gross deformities. Without edema, no deformity or joint abnormality.  Neurologic:  Alert and  oriented x4;  grossly normal neurologically.  Skin:   Dry and intact without significant lesions or rashes. Psychiatric: Oriented to person, place and time. Demonstrates good judgement and reason without abnormal affect or behaviors.  RELEVANT LABS AND IMAGING: CBC    Latest Ref Rng & Units 11/15/2023   11:31 AM 11/03/2023    9:38 PM 10/22/2023    5:25 AM  CBC  WBC 4.0 - 10.5 K/uL 4.9  6.7  4.6   Hemoglobin 13.0 - 17.0 g/dL 13.0  86.5  78.4   Hematocrit 39.0 - 52.0 % 37.0  41.6  29.1   Platelets 150.0 - 400.0 K/uL 204.0  113  177      CMP     Latest Ref Rng & Units 11/15/2023   11:31 AM 11/04/2023    4:17 AM 11/03/2023    9:38 PM  CMP  Glucose 70 - 99 mg/dL 72   85   BUN 6 - 23 mg/dL 10   14   Creatinine 6.96 - 1.50 mg/dL 2.95   2.84   Sodium 132 - 145 mEq/L 141   142   Potassium 3.5 - 5.1 mEq/L 4.4   3.8   Chloride 96 - 112 mEq/L 105   104   CO2 19 - 32 mEq/L 30   25   Calcium 8.4 - 10.5 mg/dL 8.8   8.8   Total Protein 6.0 - 8.3 g/dL 6.4  5.5    Total Bilirubin 0.2 - 1.2 mg/dL 0.6  1.8    Alkaline Phos 39 - 117 U/L 78  67    AST 0 - 37 U/L 20  39    ALT 0 - 53 U/L 21  49       Lab Results  Component Value Date   TSH 2.886 10/15/2023  6 /17/2025 labs show: INR 1.2/PT 12.3, BUN 10, Creat 0.74, total bili 0.6, AST 20, ALT 21, PLT 204, Hgb 11.9  10/15/23 US  ABD  limited RUQ IMPRESSION: The layering sludge within the gallbladder. No cholelithiasis. Slight gallbladder wall thickening. Fatty liver. Perihepatic ascites. 10/15/23 CT ABD/pelvis WO contrast IMPRESSION: 1. Hepatic steatosis. Small volume abdominopelvic ascites. 2. Cardiomegaly. 10/15/23 CT head WO contrast IMPRESSION: No acute intracranial abnormality.  Assessment/Plan:  Alcoholic hepatitis Discriminant function 61 on 5/17, started on prednisolone  11/16/23 DF now normalized. Ultrasound with trace perihepatic ascites, inadequate for paracentesis.  Steatosis, but no evidence of cirrhosis. Evaluation for other causes of liver disease unremarkable other than low titer anti-smooth muscle antibody, IgG levels normal.  Based on patient's history of heavy alcohol use, AST to ALT ratio and low titer antibody level, low suspicion for autoimmune hepatitis. PLTs normal at 204. LFT's normal today. Total bili normal. INR 1.2 from 1.9/ PT 12.3 from 21.9. - Lille score suggestive of steroid benefit;  Completed 28 day total course of prednisolone . -Monitor p.o. intake, low sodium diet -continue thiamine  -continue folvite  - Patient again counseled on seriousness of alcoholic hepatitis, and high mortality risk in the setting of ongoing alcohol use  Encephalopathy Mental status normal on my exam today - Improved -Refilled lactulose   Coagulopathy Likely secondary to liver dysfunction +-/ Vit K deficiency - INR 1.2 down from 1.9 from 10/22/23   Acute kidney injury Likely prerenal in the setting of severe systolic dysfunction and diuretic therapy BUN/Creat normal 10/0.74 - Diuresis with furosemide   -pending cardiology appointment   Severe cardiomyopathy (EF less than 20%, severe global hypokinesis) Cardiology evaluated, 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 Has  been 10-11 baseline. -recheck iron panel/TIBC today -Zaquan Duffner need IV iron infusions   Hyponatremia- Likely due to alcohol, CHF and liver failure.  Sodium now normal at 141 BUN/Creat normal 10/0.74  Alcohol dependency - Strongly recommend formal alcohol rehabilitation, close follow [facility] with [facility] to manage underlying mood disorders -Has not had drink since admission  Tobacco use disorder: Reports smoking about 3 tobacco wraps daily. -Tobacco cessation  -MDD: Does not seem to be on medication.   -Started on Lexapro  by psych.   Vitamin D  deficiency (24.51) -taking vitamin D  supplement  -recheck in 3 mths  Bloating -low fodmap diet -Continue lactulose , refilled  --Follow-up with Dr. Yvone Herd in 3 mths   Thank you for the courtesy of this consult. Please call me with any questions or concerns.   Egon Dittus, FNP-C [facility] 11/16/2023, 2:12 PM  Cc: No ref. provider found

## 2023-11-16 NOTE — Progress Notes (Deleted)
 Referring-Amelia Elvan Hamel, MD Reason for referral-lower extremity edema/cardiomyopathy  HPI: 33 year old male for evaluation of lower extremity edema/cardiomyopathy at request of Abraham Abo, MD.  Patient previously seen by Dr. Renna Cary.  Presented with CHF and severe hyponatremia May 2025.  Echocardiogram May 2025 showed ejection fraction less than 20%, severe left ventricular enlargement, grade 3 diastolic dysfunction, severe RV dysfunction, moderate right ventricular enlargement, severe biatrial enlargement, moderate to severe mitral regurgitation, mild to moderate tricuspid regurgitation, mild aortic insufficiency and moderately dilated pulmonary artery.  BNP June 2025 2549.  Patient also with history of alcohol abuse and cardiomyopathy is felt likely alcohol mediated.  Current Outpatient Medications  Medication Sig Dispense Refill   artificial tears (LACRILUBE) OINT ophthalmic ointment Place into both eyes every 4 (four) hours as needed for dry eyes. 3.5 g 0   buPROPion  ER (WELLBUTRIN  SR) 100 MG 12 hr tablet Take 1 tablet (100 mg total) by mouth 2 (two) times daily. For help with depression and may help reduce cigarette cravings 60 tablet 2   escitalopram  (LEXAPRO ) 5 MG tablet Take 1 tablet (5 mg total) by mouth daily. 30 tablet 0   feeding supplement (ENSURE ENLIVE / ENSURE PLUS) LIQD Take 237 mLs by mouth 2 (two) times daily between meals. 237 mL 12   folic acid  (FOLVITE ) 1 MG tablet Take 1 tablet (1 mg total) by mouth daily. 30 tablet 1   furosemide  (LASIX ) 20 MG tablet Take 1 tablet (20 mg total) by mouth daily as needed. 30 tablet 1   hydrOXYzine  (ATARAX ) 25 MG tablet Take 0.5-1 tablets (12.5-25 mg total) by mouth every 8 (eight) hours as needed for anxiety (or sleep). 60 tablet 1   lactulose  (CHRONULAC ) 10 GM/15ML solution Take 45 mLs (30 g total) by mouth 2 (two) times daily. 237 mL 0   metoprolol  succinate (TOPROL -XL) 25 MG 24 hr tablet Take 1 tablet (25 mg total) by mouth daily. 30  tablet 1   Multiple Vitamin (MULTIVITAMIN WITH MINERALS) TABS tablet Take 1 tablet by mouth daily. 30 tablet 1   nicotine  (NICODERM CQ  - DOSED IN MG/24 HOURS) 14 mg/24hr patch Place 1 patch (14 mg total) onto the skin daily. 28 patch 0   pantoprazole  (PROTONIX ) 40 MG tablet Take 1 tablet (40 mg total) by mouth daily. 30 tablet 1   prednisoLONE  (ORAPRED ) 15 MG/5ML solution Take 13.3 mLs (40 mg) by mouth daily for 21 days 280 mL 0   thiamine  (VITAMIN B1) 100 MG tablet Take 1 tablet (100 mg total) by mouth daily. 30 tablet 1   trolamine salicylate (ASPERCREME) 10 % cream Apply topically 2 (two) times daily as needed for muscle pain. 85 g 0   Vitamin D , Ergocalciferol , (DRISDOL ) 1.25 MG (50000 UNIT) CAPS capsule Take 1 capsule (50,000 Units total) by mouth every 7 (seven) days. 12 capsule 1   No current facility-administered medications for this visit.    No Known Allergies   Past Medical History:  Diagnosis Date   Acute alcoholic hepatitis 10/15/2023   Alcohol withdrawal (HCC)    Eczema    ETOH abuse    HFrEF (heart failure with reduced ejection fraction) (HCC)    Hyponatremia with excess extracellular fluid volume 10/15/2023   MDD (major depressive disorder)    Severe alcohol dependence (HCC)    Tobacco abuse    Traumatic subdural hematoma (SDH) (HCC) 2019   Fall    No past surgical history on file.  Social History   Socioeconomic History  Marital status: Single    Spouse name: Not on file   Number of children: Not on file   Years of education: Not on file   Highest education level: Not on file  Occupational History   Not on file  Tobacco Use   Smoking status: Every Day    Current packs/day: 0.25    Average packs/day: 0.3 packs/day for 3.0 years (0.8 ttl pk-yrs)    Types: Cigarettes   Smokeless tobacco: Never  Vaping Use   Vaping status: Some Days  Substance and Sexual Activity   Alcohol use: Yes    Comment: 3-4 40oz beers a day   Drug use: Not Currently    Types:  Marijuana   Sexual activity: Never  Other Topics Concern   Not on file  Social History Narrative   Not on file   Social Drivers of Health   Financial Resource Strain: Not on file  Food Insecurity: No Food Insecurity (10/15/2023)   Hunger Vital Sign    Worried About Running Out of Food in the Last Year: Never true    Ran Out of Food in the Last Year: Never true  Transportation Needs: No Transportation Needs (10/15/2023)   PRAPARE - Administrator, Civil Service (Medical): No    Lack of Transportation (Non-Medical): No  Physical Activity: Not on file  Stress: Not on file  Social Connections: Moderately Isolated (10/15/2023)   Social Connection and Isolation Panel    Frequency of Communication with Friends and Family: Twice a week    Frequency of Social Gatherings with Friends and Family: Twice a week    Attends Religious Services: Never    Database administrator or Organizations: No    Attends Engineer, structural: 1 to 4 times per year    Marital Status: Widowed  Intimate Partner Violence: Not At Risk (10/15/2023)   Humiliation, Afraid, Rape, and Kick questionnaire    Fear of Current or Ex-Partner: No    Emotionally Abused: No    Physically Abused: No    Sexually Abused: No    Family History  Problem Relation Age of Onset   Mental illness Neg Hx     ROS: no fevers or chills, productive cough, hemoptysis, dysphasia, odynophagia, melena, hematochezia, dysuria, hematuria, rash, seizure activity, orthopnea, PND, pedal edema, claudication. Remaining systems are negative.  Physical Exam:   There were no vitals taken for this visit.  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  3 alcohol abuse-  4 history of hyponatremia-  Alexandria Angel, MD

## 2023-11-16 NOTE — Patient Instructions (Addendum)
 Recommend low fod map diet for bloating Sent new prescription for lactulose , titrate as needed to have daily bowel movement. Continue Lasix  Continue vitamin D  Continue folvite  Continue thiamine  Recommend Low sodium diet No alcohol  Your provider has requested that you go to the basement level for lab work before leaving today. Press B on the elevator. The lab is located at the first door on the left as you exit the elevator.   _______________________________________________________  If your blood pressure at your visit was 140/90 or greater, please contact your primary care physician to follow up on this.  _______________________________________________________  If you are age 33 or older, your body mass index should be between 23-30. Your Body mass index is 21.62 kg/m. If this is out of the aforementioned range listed, please consider follow up with your Primary Care Provider.  If you are age 45 or younger, your body mass index should be between 19-25. Your Body mass index is 21.62 kg/m. If this is out of the aformentioned range listed, please consider follow up with your Primary Care Provider.   ________________________________________________________  The [facility] providers would like to encourage you to use MYCHART to communicate with providers for non-urgent requests or questions.  Due to long hold times on the telephone, sending your provider a message by [facility] may be a faster and more efficient way to get a response.  Please allow 48 business hours for a response.  Please remember that this is for non-urgent requests.  _______________________________________________________   Thank you for trusting me with your gastrointestinal care. Deanna May, RNP

## 2023-11-23 NOTE — Progress Notes (Unsigned)
 Bradley Blush, MD Reason for referral-lower extremity edema/cardiomyopathy Cardiologist-Mark Skains MD  HPI: 33 year old male for evaluation of lower extremity edema/cardiomyopathy at request of Bradley Blush, MD.  Patient previously seen by Dr. Jeffrie.  Presented with CHF and severe hyponatremia May 2025.  Echocardiogram May 2025 showed ejection fraction less than 20%, severe left ventricular enlargement, grade 3 diastolic dysfunction, severe RV dysfunction, moderate right ventricular enlargement, severe biatrial enlargement, moderate to severe mitral regurgitation, mild to moderate tricuspid regurgitation, mild aortic insufficiency and moderately dilated pulmonary artery.  BNP June 2025 2549.  Patient also with history of alcohol abuse and cardiomyopathy is felt likely alcohol mediated.  Patient denies dyspnea on exertion, orthopnea, PND, chest pain or syncope.  He has mild pedal edema which has improved since he was hospitalized.  Current Outpatient Medications  Medication Sig Dispense Refill   artificial tears (LACRILUBE) OINT ophthalmic ointment Place into both eyes every 4 (four) hours as needed for dry eyes. 3.5 g 0   buPROPion  ER (WELLBUTRIN  SR) 100 MG 12 hr tablet Take 1 tablet (100 mg total) by mouth 2 (two) times daily. For help with depression and may help reduce cigarette cravings 60 tablet 2   escitalopram  (LEXAPRO ) 5 MG tablet Take 1 tablet (5 mg total) by mouth daily. 30 tablet 0   feeding supplement (ENSURE ENLIVE / ENSURE PLUS) LIQD Take 237 mLs by mouth 2 (two) times daily between meals. 237 mL 12   folic acid  (FOLVITE ) 1 MG tablet Take 1 tablet (1 mg total) by mouth daily. 30 tablet 1   furosemide  (LASIX ) 20 MG tablet Take 1 tablet (20 mg total) by mouth daily as needed. 30 tablet 1   hydrOXYzine  (ATARAX ) 25 MG tablet Take 0.5-1 tablets (12.5-25 mg total) by mouth every 8 (eight) hours as needed for anxiety (or sleep). 60 tablet 1   lactulose  (CHRONULAC ) 10  GM/15ML solution Take 15 mLs (10 g total) by mouth 3 (three) times daily as needed for mild constipation. 237 mL 0   metoprolol  succinate (TOPROL -XL) 25 MG 24 hr tablet Take 1 tablet (25 mg total) by mouth daily. 30 tablet 1   Multiple Vitamin (MULTIVITAMIN WITH MINERALS) TABS tablet Take 1 tablet by mouth daily. 30 tablet 1   nicotine  (NICODERM CQ  - DOSED IN MG/24 HOURS) 14 mg/24hr patch Place 1 patch (14 mg total) onto the skin daily. 28 patch 0   pantoprazole  (PROTONIX ) 40 MG tablet Take 1 tablet (40 mg total) by mouth daily. 30 tablet 1   prednisoLONE  (ORAPRED ) 15 MG/5ML solution Take 13.3 mLs (40 mg) by mouth daily for 21 days 280 mL 0   thiamine  (VITAMIN B1) 100 MG tablet Take 1 tablet (100 mg total) by mouth daily. 30 tablet 1   trolamine salicylate (ASPERCREME) 10 % cream Apply topically 2 (two) times daily as needed for muscle pain. 85 g 0   Vitamin D , Ergocalciferol , (DRISDOL ) 1.25 MG (50000 UNIT) CAPS capsule Take 1 capsule (50,000 Units total) by mouth every 7 (seven) days. 12 capsule 1   No current facility-administered medications for this visit.    No Known Allergies   Past Medical History:  Diagnosis Date   Acute alcoholic hepatitis 10/15/2023   Alcohol withdrawal (HCC)    Eczema    ETOH abuse    HFrEF (heart failure with reduced ejection fraction) (HCC)    Hyponatremia with excess extracellular fluid volume 10/15/2023   MDD (major depressive disorder)    Severe alcohol dependence (HCC)  Tobacco abuse    Traumatic subdural hematoma (SDH) (HCC) 2019   Fall    History reviewed. No pertinent surgical history.  Social History   Socioeconomic History   Marital status: Single    Spouse name: Not on file   Number of children: Not on file   Years of education: Not on file   Highest education level: Not on file  Occupational History   Not on file  Tobacco Use   Smoking status: Every Day    Current packs/day: 0.25    Average packs/day: 0.3 packs/day for 3.0 years  (0.8 ttl pk-yrs)    Types: Cigarettes   Smokeless tobacco: Never  Vaping Use   Vaping status: Some Days  Substance and Sexual Activity   Alcohol use: Not Currently    Comment: 3-4 40oz beers a day   Drug use: Not Currently    Types: Marijuana   Sexual activity: Never  Other Topics Concern   Not on file  Social History Narrative   Not on file   Social Drivers of Health   Financial Resource Strain: Not on file  Food Insecurity: No Food Insecurity (10/15/2023)   Hunger Vital Sign    Worried About Running Out of Food in the Last Year: Never true    Ran Out of Food in the Last Year: Never true  Transportation Needs: No Transportation Needs (10/15/2023)   PRAPARE - Administrator, Civil Service (Medical): No    Lack of Transportation (Non-Medical): No  Physical Activity: Not on file  Stress: Not on file  Social Connections: Moderately Isolated (10/15/2023)   Social Connection and Isolation Panel    Frequency of Communication with Friends and Family: Twice a week    Frequency of Social Gatherings with Friends and Family: Twice a week    Attends Religious Services: Never    Database administrator or Organizations: No    Attends Engineer, structural: 1 to 4 times per year    Marital Status: Widowed  Intimate Partner Violence: Not At Risk (10/15/2023)   Humiliation, Afraid, Rape, and Kick questionnaire    Fear of Current or Ex-Partner: No    Emotionally Abused: No    Physically Abused: No    Sexually Abused: No    Family History  Problem Relation Age of Onset   Mental illness Neg Hx     ROS: no fevers or chills, productive cough, hemoptysis, dysphasia, odynophagia, melena, hematochezia, dysuria, hematuria, rash, seizure activity, orthopnea, PND, pedal edema, claudication. Remaining systems are negative.  Physical Exam:   Blood pressure 101/69, pulse 84, height 5' 11 (1.803 m), weight 157 lb 12.8 oz (71.6 kg), SpO2 98%.  General:  Well developed/well  nourished in NAD Skin warm/dry Patient not depressed No peripheral clubbing Back-normal HEENT-normal/normal eyelids Neck supple/normal carotid upstroke bilaterally; no bruits; no JVD; no thyromegaly chest - CTA/ normal expansion CV - RRR/normal S1 and S2; no murmurs, rubs or gallops;  PMI nondisplaced Abdomen -NT/ND, no HSM, no mass, + bowel sounds, no bruit 2+ femoral pulses, no bruits Ext-trace to 1+ ankle edema, no chords, 2+ DP Neuro-grossly nonfocal   A/P  1 chronic systolic congestive heart failure-patient's volume status has improved since he was hospitalized by his report.  Will continue Lasix  at present dose as well as Toprol .  Blood pressure is borderline.  Will add losartan 25 mg daily to see if he tolerates and potentially transition to Entresto in the future.  Add spironolactone 12.5 mg  daily.  Check potassium and renal function in 1 week.  I will have him follow-up in CHF clinic for medication titration.  Would add SGLT2 inhibitor at next office visit if he is tolerating above changes.  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  Of asked him to avoid all alcohol.  Would likely repeat echocardiogram 3 months after he abstains to reassess LV function.  Will ask him to follow-up with Dr. Jeffrie 3 months from now.  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  Plan as outlined above.  3 alcohol abuse-he states he has avoided since discharge.  4 mitral regurgitation-moderate to severe on previous echocardiogram.  Reassess with echocardiogram after medication titration.  5 history of hyponatremia- improved on most recent laboratories.  Redell Shallow, MD

## 2023-11-24 ENCOUNTER — Ambulatory Visit: Admitting: Cardiology

## 2023-11-24 ENCOUNTER — Encounter: Payer: Self-pay | Admitting: Cardiology

## 2023-11-24 ENCOUNTER — Ambulatory Visit: Attending: Cardiology | Admitting: Cardiology

## 2023-11-24 VITALS — BP 101/69 | HR 84 | Ht 71.0 in | Wt 157.8 lb

## 2023-11-24 DIAGNOSIS — I420 Dilated cardiomyopathy: Secondary | ICD-10-CM

## 2023-11-24 DIAGNOSIS — F1020 Alcohol dependence, uncomplicated: Secondary | ICD-10-CM | POA: Diagnosis not present

## 2023-11-24 DIAGNOSIS — I5020 Unspecified systolic (congestive) heart failure: Secondary | ICD-10-CM

## 2023-11-24 MED ORDER — LOSARTAN POTASSIUM 25 MG PO TABS: 25.0000 mg | ORAL_TABLET | Freq: Every day | ORAL | 3 refills | Status: DC

## 2023-11-24 MED ORDER — SPIRONOLACTONE 25 MG PO TABS: 12.5000 mg | ORAL_TABLET | Freq: Every day | ORAL | 3 refills | Status: DC

## 2023-11-24 NOTE — Patient Instructions (Signed)
 Medication Instructions:   START SPIRONOLACTONE 12.5 MG ONCE DAILY= 1/2 OF THE 25 MG TABLET ONCE DAILY  START LOSARTAN 25 MG ONCE DAILY  *If you need a refill on your cardiac medications before your next appointment, please call your pharmacy*  Lab Work:  Your physician recommends that you return for lab work in: ONE WEEK-DO NOT NEED TO FAST  If you have labs (blood work) drawn today and your tests are completely normal, you will receive your results only by: MyChart Message (if you have MyChart) OR A paper copy in the mail If you have any lab test that is abnormal or we need to change your treatment, we will call you to review the results.  Follow-Up: At [facility], you and your health needs are our priority.  As part of our continuing mission to provide you with exceptional heart care, our providers are all part of one team.  This team includes your primary Cardiologist (physician) and Advanced Practice Providers or APPs (Physician Assistants and Nurse Practitioners) who all work together to provide you with the care you need, when you need it.  Your next appointment:   HEART FAILURE CLINIC FOR MED-TITRATION

## 2023-12-07 ENCOUNTER — Other Ambulatory Visit: Payer: Self-pay | Admitting: Physician Assistant

## 2023-12-07 DIAGNOSIS — F331 Major depressive disorder, recurrent, moderate: Secondary | ICD-10-CM

## 2023-12-21 ENCOUNTER — Ambulatory Visit (INDEPENDENT_AMBULATORY_CARE_PROVIDER_SITE_OTHER): Admitting: Family Medicine

## 2023-12-21 ENCOUNTER — Encounter: Payer: Self-pay | Admitting: Family Medicine

## 2023-12-21 VITALS — BP 98/67 | HR 98 | Ht 71.0 in | Wt 158.4 lb

## 2023-12-21 DIAGNOSIS — F1020 Alcohol dependence, uncomplicated: Secondary | ICD-10-CM | POA: Diagnosis not present

## 2023-12-21 DIAGNOSIS — I509 Heart failure, unspecified: Secondary | ICD-10-CM

## 2023-12-21 DIAGNOSIS — D508 Other iron deficiency anemias: Secondary | ICD-10-CM | POA: Diagnosis not present

## 2023-12-21 DIAGNOSIS — Z720 Tobacco use: Secondary | ICD-10-CM | POA: Diagnosis not present

## 2023-12-21 MED ORDER — ENSURE ENLIVE PO LIQD: 237.0000 mL | Freq: Two times a day (BID) | ORAL | 12 refills | Status: DC

## 2023-12-21 NOTE — Progress Notes (Signed)
 Established Patient Office Visit  Subjective    Patient ID: Bradley Cummings, male    DOB: 04-03-1991  Age: 33 y.o. MRN: [id number]  CC:  Chief Complaint  Patient presents with   Medical Management of Chronic Issues    No concerns     HPI Bradley Cummings presents for follow up of chronic med issues including CHF and anemia. Patient also has issues related to his chronic alcohol use. He is not ready to stop drinking.   Outpatient Encounter Medications as of 12/21/2023  Medication Sig   artificial tears (LACRILUBE) OINT ophthalmic ointment Place into both eyes every 4 (four) hours as needed for dry eyes.   buPROPion  ER (WELLBUTRIN  SR) 100 MG 12 hr tablet Take 1 tablet (100 mg total) by mouth 2 (two) times daily. For help with depression and may help reduce cigarette cravings   escitalopram  (LEXAPRO ) 5 MG tablet Take 1 tablet (5 mg total) by mouth daily.   folic acid  (FOLVITE ) 1 MG tablet Take 1 tablet (1 mg total) by mouth daily.   furosemide  (LASIX ) 20 MG tablet Take 1 tablet (20 mg total) by mouth daily as needed.   hydrOXYzine  (ATARAX ) 25 MG tablet Take 0.5-1 tablets (12.5-25 mg total) by mouth every 8 (eight) hours as needed for anxiety (or sleep).   lactulose  (CHRONULAC ) 10 GM/15ML solution Take 15 mLs (10 g total) by mouth 3 (three) times daily as needed for mild constipation.   losartan  (COZAAR ) 25 MG tablet Take 1 tablet (25 mg total) by mouth daily.   metoprolol  succinate (TOPROL -XL) 25 MG 24 hr tablet Take 1 tablet (25 mg total) by mouth daily.   Multiple Vitamin (MULTIVITAMIN WITH MINERALS) TABS tablet Take 1 tablet by mouth daily.   pantoprazole  (PROTONIX ) 40 MG tablet Take 1 tablet (40 mg total) by mouth daily.   prednisoLONE  (ORAPRED ) 15 MG/5ML solution Take 13.3 mLs (40 mg) by mouth daily for 21 days   spironolactone  (ALDACTONE ) 25 MG tablet Take 0.5 tablets (12.5 mg total) by mouth daily.   thiamine  (VITAMIN B1) 100 MG tablet Take 1 tablet (100 mg total) by mouth daily.    trolamine salicylate (ASPERCREME) 10 % cream Apply topically 2 (two) times daily as needed for muscle pain.   Vitamin D , Ergocalciferol , (DRISDOL ) 1.25 MG (50000 UNIT) CAPS capsule Take 1 capsule (50,000 Units total) by mouth every 7 (seven) days.   feeding supplement (ENSURE ENLIVE / ENSURE PLUS) LIQD Take 237 mLs by mouth 2 (two) times daily between meals.   nicotine  (NICODERM CQ  - DOSED IN MG/24 HOURS) 14 mg/24hr patch Place 1 patch (14 mg total) onto the skin daily. (Patient not taking: Reported on 12/21/2023)   [DISCONTINUED] feeding supplement (ENSURE ENLIVE / ENSURE PLUS) LIQD Take 237 mLs by mouth 2 (two) times daily between meals.   No facility-administered encounter medications on file as of 12/21/2023.    Past Medical History:  Diagnosis Date   Acute alcoholic hepatitis 10/15/2023   Alcohol withdrawal (HCC)    Eczema    ETOH abuse    HFrEF (heart failure with reduced ejection fraction) (HCC)    Hyponatremia with excess extracellular fluid volume 10/15/2023   MDD (major depressive disorder)    Severe alcohol dependence (HCC)    Tobacco abuse    Traumatic subdural hematoma (SDH) (HCC) 2019   Fall    History reviewed. No pertinent surgical history.  Family History  Problem Relation Age of Onset   Mental illness Neg Hx  Social History   Socioeconomic History   Marital status: Single    Spouse name: Not on file   Number of children: Not on file   Years of education: Not on file   Highest education level: Not on file  Occupational History   Not on file  Tobacco Use   Smoking status: Every Day    Current packs/day: 0.25    Average packs/day: 0.3 packs/day for 3.0 years (0.8 ttl pk-yrs)    Types: Cigarettes   Smokeless tobacco: Never  Vaping Use   Vaping status: Some Days  Substance and Sexual Activity   Alcohol use: Not Currently    Comment: 3-4 40oz beers a day   Drug use: Not Currently    Types: Marijuana   Sexual activity: Never  Other Topics Concern    Not on file  Social History Narrative   Not on file   Social Drivers of Health   Financial Resource Strain: Not on file  Food Insecurity: No Food Insecurity (10/15/2023)   Hunger Vital Sign    Worried About Running Out of Food in the Last Year: Never true    Ran Out of Food in the Last Year: Never true  Transportation Needs: No Transportation Needs (10/15/2023)   PRAPARE - Administrator, Civil Service (Medical): No    Lack of Transportation (Non-Medical): No  Physical Activity: Not on file  Stress: Not on file  Social Connections: Moderately Isolated (10/15/2023)   Social Connection and Isolation Panel    Frequency of Communication with Friends and Family: Twice a week    Frequency of Social Gatherings with Friends and Family: Twice a week    Attends Religious Services: Never    Database administrator or Organizations: No    Attends Engineer, structural: 1 to 4 times per year    Marital Status: Widowed  Intimate Partner Violence: Not At Risk (10/15/2023)   Humiliation, Afraid, Rape, and Kick questionnaire    Fear of Current or Ex-Partner: No    Emotionally Abused: No    Physically Abused: No    Sexually Abused: No    Review of Systems  Psychiatric/Behavioral:  Positive for substance abuse.   All other systems reviewed and are negative.       Objective    BP 98/67   Pulse 98   Ht 5' 11 (1.803 m)   Wt 158 lb 6.4 oz (71.8 kg)   SpO2 95%   BMI 22.09 kg/m   Physical Exam Vitals and nursing note reviewed.  Constitutional:      General: He is not in acute distress. Cardiovascular:     Rate and Rhythm: Normal rate and regular rhythm.  Pulmonary:     Effort: Pulmonary effort is normal.     Breath sounds: Normal breath sounds.  Abdominal:     Palpations: Abdomen is soft.     Tenderness: There is no abdominal tenderness.  Neurological:     General: No focal deficit present.     Mental Status: He is alert and oriented to person, place, and time.          Assessment & Plan:   Other iron deficiency anemia -     Ensure Enlive; Take 237 mLs by mouth 2 (two) times daily between meals.  Dispense: 237 mL; Refill: 12  Congestive heart failure, unspecified HF chronicity, unspecified heart failure type (HCC)  Tobacco abuse  Alcohol use disorder, moderate, dependence (HCC)  Return in about 6 months (around 06/22/2024) for follow up.   Tanda Raguel SQUIBB, MD

## 2023-12-27 ENCOUNTER — Encounter: Payer: Self-pay | Admitting: Family Medicine

## 2023-12-29 ENCOUNTER — Encounter: Payer: Self-pay | Admitting: *Deleted

## 2024-01-21 ENCOUNTER — Telehealth (HOSPITAL_COMMUNITY): Payer: Self-pay | Admitting: Cardiology

## 2024-01-21 NOTE — Telephone Encounter (Signed)
 Called to confirm/remind patient of their appointment at the [facility] on 01/21/24.   Appointment:   [x] Confirmed  [] Left mess   [] No answer/No voice mail  [] VM Full/unable to leave message  [] Phone not in service  Patient reminded to bring all medications and/or complete list.  Confirmed patient has transportation. Gave directions, instructed to utilize valet parking.

## 2024-01-24 ENCOUNTER — Telehealth (HOSPITAL_COMMUNITY): Payer: Self-pay | Admitting: Cardiology

## 2024-01-24 NOTE — Telephone Encounter (Signed)
 Called to confirm/remind patient of their appointment at the [facility] on 01/24/2024.   Appointment:   [x] Confirmed  [] Left mess   [] No answer/No voice mail  [] VM Full/unable to leave message  [] Phone not in service  Patient reminded to bring all medications and/or complete list.  Confirmed patient has transportation. Gave directions, instructed to utilize valet parking.

## 2024-01-25 ENCOUNTER — Encounter (HOSPITAL_COMMUNITY): Payer: Self-pay | Admitting: Cardiology

## 2024-01-25 ENCOUNTER — Ambulatory Visit (HOSPITAL_COMMUNITY)
Admission: RE | Admit: 2024-01-25 | Discharge: 2024-01-25 | Disposition: A | Discharge status: Home / Self Care | Source: Ambulatory Visit | Attending: Cardiology | Admitting: Cardiology

## 2024-01-25 ENCOUNTER — Ambulatory Visit: Payer: Self-pay | Admitting: Cardiology

## 2024-01-25 DIAGNOSIS — R600 Localized edema: Secondary | ICD-10-CM | POA: Diagnosis present

## 2024-01-25 DIAGNOSIS — F1011 Alcohol abuse, in remission: Secondary | ICD-10-CM | POA: Diagnosis not present

## 2024-01-25 DIAGNOSIS — I5022 Chronic systolic (congestive) heart failure: Secondary | ICD-10-CM | POA: Insufficient documentation

## 2024-01-25 DIAGNOSIS — I5020 Unspecified systolic (congestive) heart failure: Secondary | ICD-10-CM

## 2024-01-25 DIAGNOSIS — R0602 Shortness of breath: Secondary | ICD-10-CM | POA: Diagnosis present

## 2024-01-25 LAB — BASIC METABOLIC PANEL WITH GFR
BUN/Creatinine Ratio: 6 — ABNORMAL LOW (ref 9–20)
BUN: 5 mg/dL — ABNORMAL LOW (ref 6–20)
CO2: 20 mmol/L (ref 20–29)
Calcium: 8.8 mg/dL (ref 8.7–10.2)
Chloride: 104 mmol/L (ref 96–106)
Creatinine, Ser: 0.84 mg/dL (ref 0.76–1.27)
Glucose: 97 mg/dL (ref 70–99)
Potassium: 4.4 mmol/L (ref 3.5–5.2)
Sodium: 142 mmol/L (ref 134–144)
eGFR: 119 mL/min/1.73 (ref >59)

## 2024-01-25 MED ORDER — FUROSEMIDE 20 MG PO TABS: 20.0000 mg | ORAL_TABLET | Freq: Every day | ORAL | 4 refills | Status: DC | PRN

## 2024-01-25 MED ORDER — METOPROLOL SUCCINATE ER 25 MG PO TB24: 25.0000 mg | ORAL_TABLET | Freq: Every day | ORAL | 3 refills | Status: DC

## 2024-01-25 MED ORDER — SPIRONOLACTONE 25 MG PO TABS: 25.0000 mg | ORAL_TABLET | Freq: Every day | ORAL | 3 refills | Status: DC

## 2024-01-25 MED ORDER — LOSARTAN POTASSIUM 25 MG PO TABS
25.0000 mg | ORAL_TABLET | Freq: Every day | ORAL | 3 refills | Status: DC
Start: 2024-01-25 — End: 2024-02-23

## 2024-01-25 NOTE — Patient Instructions (Signed)
 INCREASE Spironolactone  to 25 mg daily.  Your physician has requested that you have a cardiac MRI. Cardiac MRI uses a computer to create images of your heart as its beating, producing both still and moving pictures of your heart and major blood vessels. For further information please visit [email or web address]. Please follow the instruction sheet given to you today for more information.  Your physician recommends that you schedule a follow-up appointment in: 1 month.  If you have any questions or concerns before your next appointment please send us  a message through [place] or call our office at [phone].    TO LEAVE A MESSAGE FOR THE NURSE SELECT OPTION 2, PLEASE LEAVE A MESSAGE INCLUDING: YOUR NAME DATE OF BIRTH CALL BACK NUMBER REASON FOR CALL**this is important as we prioritize the call backs  YOU WILL RECEIVE A CALL BACK THE SAME DAY AS LONG AS YOU CALL BEFORE 4:00 PM  At the [facility], you and your health needs are our priority. As part of our continuing mission to provide you with exceptional heart care, we have created designated Provider Care Teams. These Care Teams include your primary Cardiologist (physician) and Advanced Practice Providers (APPs- Physician Assistants and Nurse Practitioners) who all work together to provide you with the care you need, when you need it.   You may see any of the following providers on your designated Care Team at your next follow up: Dr Toribio Fuel Dr Ezra Shuck Dr. Ria Commander Dr. Morene Brownie Amy Lenetta, NP Caffie Shed, [place] [facility] [place], [place] Beckey Coe, NP [place] Lee, NP Ellouise Class, NP Tinnie Redman, PharmD Jaun Bash, PharmD   Please be sure to bring in all your medications bottles to every appointment.    Thank you for choosing [facility]

## 2024-01-27 NOTE — Telephone Encounter (Signed)
 Letter of results sent to pt

## 2024-01-31 NOTE — Progress Notes (Signed)
   ADVANCED HEART FAILURE NEW PATIENT CLINIC NOTE  Referring Physician: Tanda Bleacher, MD  Primary Care: Tanda Bleacher, MD Primary Cardiologist:  HPI: Bradley Cummings is a 33 y.o. male with a PMH of systolic heart failure who presents for initial visit for further evaluation and treatment of heart failure/cardiomyopathy.      Presented with CHF and severe hyponatremia May 2025. Echocardiogram May 2025 showed ejection fraction less than 20%, significant dilation. He was reportedly drinking a significant amount of alcohol, but after further questioning was drinking a 24oz beer daily, occasionally an additional beer. No liquor or other alcohol. No family history of HF.      SUBJECTIVE:  Patient presented with lower extremity swelling and shortness of breath, improved after diuresis and starting of goal-directed medical therapy.  He is feeling much better after diuresis, but has been hesitant to exercise given concerns about his heart.  He has reportedly stopped drinking alcohol.  No current issues with his medications, denies any dizziness, presyncopal episodes, palpitations.  PMH, current medications, allergies, social history, and family history reviewed in epic.  PHYSICAL EXAM: Vitals:   01/25/24 1148  BP: 100/60  Pulse: 72  SpO2: 100%   GENERAL: Well nourished and in no apparent distress at rest.  PULM:  Normal work of breathing, clear to auscultation bilaterally. Respirations are unlabored.  CARDIAC:  JVP: Flat         Normal rate with regular rhythm. No murmurs, rubs or gallops.  No edema. Warm and well perfused extremities. ABDOMEN: Soft, non-tender, non-distended. NEUROLOGIC: Patient is oriented x3 with no focal or lateralizing neurologic deficits.    DATA REVIEW  ECG: 11/04/2023: Sinus tachy, RAE    ECHO: 09/2023: LVEF less than 20%, severely decreased function, severely dilated LVIDD, grade 3 diastolic dysfunction, severely reduced RV systolic  function  CATH: None  ASSESSMENT & PLAN:  Chronic systolic heart failure: Significant remodeling based on echocardiogram, improving NYHA class II symptoms.  Do not believe that alcohol is the sole etiology for his cardiomyopathy given relatively unimpressive consumption.  No significant family history, myocarditis is certainly consideration. - Cardiac MRI - Consider further workup, coronary evaluation versus genetic testing based on results - Increase spironolactone  25 mg daily - Continue Lasix  20 mg as needed, euvolemic - Continue metoprolol  succinate 25 mg daily - Continue 25 mg losartan  daily, BP too low for transition to Entresto - SGLT2 at next visit  Alcohol use: Mild-moderate usage, in remission. - Congratulated  I spent 50 minutes caring for this patient today including face to face time, ordering and reviewing labs, reviewing records from hospital stay and imaging, seeing the patient, documenting in the record, and arranging follow ups.   Follow up in 1-2 months  Morene Brownie, MD Advanced Heart Failure Mechanical Circulatory Support 01/31/24

## 2024-02-22 ENCOUNTER — Telehealth (HOSPITAL_COMMUNITY): Payer: Self-pay

## 2024-02-22 NOTE — Telephone Encounter (Signed)
 Called and spoke to a family-friend to confirm/remind patient of their appointment at the [facility] on 02/23/24.   Appointment:   [x] Confirmed  [] Left mess   [] No answer/No voice mail  [] VM Full/unable to leave message  [] Phone not in service  Patient reminded to bring all medications and/or complete list.  Confirmed patient has transportation. Gave directions, instructed to utilize valet parking.

## 2024-02-23 ENCOUNTER — Encounter: Payer: Self-pay | Admitting: Pediatrics

## 2024-02-23 ENCOUNTER — Encounter (HOSPITAL_COMMUNITY): Payer: Self-pay

## 2024-02-23 ENCOUNTER — Ambulatory Visit (HOSPITAL_COMMUNITY)
Admission: RE | Admit: 2024-02-23 | Discharge: 2024-02-23 | Disposition: A | Discharge status: Home / Self Care | Source: Ambulatory Visit | Attending: Family Medicine | Admitting: Family Medicine

## 2024-02-23 ENCOUNTER — Other Ambulatory Visit (HOSPITAL_COMMUNITY): Payer: Self-pay | Admitting: Family Medicine

## 2024-02-23 ENCOUNTER — Ambulatory Visit (HOSPITAL_COMMUNITY): Payer: Self-pay | Admitting: Family Medicine

## 2024-02-23 VITALS — BP 115/85 | HR 88 | Ht 71.0 in | Wt 153.8 lb

## 2024-02-23 DIAGNOSIS — Z716 Tobacco abuse counseling: Secondary | ICD-10-CM | POA: Diagnosis not present

## 2024-02-23 DIAGNOSIS — Z79899 Other long term (current) drug therapy: Secondary | ICD-10-CM | POA: Insufficient documentation

## 2024-02-23 DIAGNOSIS — F1020 Alcohol dependence, uncomplicated: Secondary | ICD-10-CM

## 2024-02-23 DIAGNOSIS — F1090 Alcohol use, unspecified, uncomplicated: Secondary | ICD-10-CM | POA: Diagnosis not present

## 2024-02-23 DIAGNOSIS — F17290 Nicotine dependence, other tobacco product, uncomplicated: Secondary | ICD-10-CM | POA: Insufficient documentation

## 2024-02-23 DIAGNOSIS — I429 Cardiomyopathy, unspecified: Secondary | ICD-10-CM | POA: Insufficient documentation

## 2024-02-23 DIAGNOSIS — I5022 Chronic systolic (congestive) heart failure: Secondary | ICD-10-CM

## 2024-02-23 DIAGNOSIS — Z720 Tobacco use: Secondary | ICD-10-CM

## 2024-02-23 LAB — BASIC METABOLIC PANEL WITH GFR
Anion gap: 11 (ref 5–15)
BUN: 5 mg/dL — ABNORMAL LOW (ref 6–20)
CO2: 24 mmol/L (ref 22–32)
Calcium: 8.9 mg/dL (ref 8.9–10.3)
Chloride: 105 mmol/L (ref 98–111)
Creatinine, Ser: 0.86 mg/dL (ref 0.61–1.24)
GFR, Estimated: >60 mL/min (ref >60)
Glucose, Bld: 77 mg/dL (ref 70–99)
Potassium: 4 mmol/L (ref 3.5–5.1)
Sodium: 140 mmol/L (ref 135–145)

## 2024-02-23 LAB — BRAIN NATRIURETIC PEPTIDE: B Natriuretic Peptide: 616.3 pg/mL — ABNORMAL HIGH (ref 0.0–100.0)

## 2024-02-23 MED ORDER — METOPROLOL SUCCINATE ER 25 MG PO TB24: 12.5000 mg | ORAL_TABLET | Freq: Every day | ORAL | 3 refills | Status: DC

## 2024-02-23 MED ORDER — EMPAGLIFLOZIN 10 MG PO TABS: 10.0000 mg | ORAL_TABLET | Freq: Every day | ORAL | 5 refills | Status: AC

## 2024-02-23 MED ORDER — LOSARTAN POTASSIUM 25 MG PO TABS: 12.5000 mg | ORAL_TABLET | Freq: Every day | ORAL | 3 refills | Status: AC

## 2024-02-23 NOTE — Patient Instructions (Signed)
 Medication Changes:  RESTART LOSARTAN  (12.5MG ) ONCE DAILY===TAKE 1/2 TABLET  RESTART METOPROLOL  (12.5MG ) ONCE DAILY ===TAKE 1/2 TABLET   TAKE FUROSEMIDE  20MG  ONCE DAILY AS NEEDED   Lab Work:  Labs done today, your results will be available in MyChart, we will contact you for abnormal readings.  Follow-Up in: 2 WEEKS AS SCHEDULED WITH APP CLINIC  At the [facility], you and your health needs are our priority. We have a designated team specialized in the treatment of Heart Failure. This Care Team includes your primary Heart Failure Specialized Cardiologist (physician), Advanced Practice Providers (APPs- Physician Assistants and Nurse Practitioners), and Pharmacist who all work together to provide you with the care you need, when you need it.   You may see any of the following providers on your designated Care Team at your next follow up:  Dr. Toribio Fuel Dr. Ezra Shuck Dr. Ria Commander Dr. Odis Brownie Greig Mosses, NP Caffie Shed, [place] [facility] [place], [place] Beckey Coe, NP [place] Lee, NP Tinnie Redman, PharmD   Please be sure to bring in all your medications bottles to every appointment.   Need to Contact Us :  If you have any questions or concerns before your next appointment please send us  a message through [place] or call our office at [phone].    TO LEAVE A MESSAGE FOR THE NURSE SELECT OPTION 2, PLEASE LEAVE A MESSAGE INCLUDING: YOUR NAME DATE OF BIRTH CALL BACK NUMBER REASON FOR CALL**this is important as we prioritize the call backs  YOU WILL RECEIVE A CALL BACK THE SAME DAY AS LONG AS YOU CALL BEFORE 4:00 PM

## 2024-02-23 NOTE — Telephone Encounter (Addendum)
 Patient's jardiance  and losartan  medication has been sent to pharmacy. Pt aware, agreeable, and verbalized understanding.  --- Message from Harlene CHRISTELLA Gainer sent at 02/23/2024  2:02 PM EDT ----- Do not re-start Toprol  as discussed at visit toady. OK to start losartan  today, as discussed at visit.  Please start Jardiance  10 mg daily. This is a HF medication that will help with fluid.  ----- Message ----- From: Interface, Lab In [place] Sent: 02/23/2024   1:36 PM EDT To: Harlene CHRISTELLA Gainer, FNP

## 2024-02-23 NOTE — Progress Notes (Signed)
   ADVANCED HEART FAILURE CLINIC NOTE  Primary Care: Tanda Bleacher, MD HF Cardiologist: Dr. Zenaida  HPI: Bradley Cummings is a 33 y.o. male with a PMH of systolic heart failure who presents for initial visit for further evaluation and treatment of heart failure/cardiomyopathy.      Presented with CHF and severe hyponatremia May 2025. Echocardiogram May 2025 showed ejection fraction less than 20%, significant dilation. He was reportedly drinking a significant amount of alcohol, but after further questioning was drinking a 24oz beer daily, occasionally an additional beer. No liquor or other alcohol. No family history of HF.      SUBJECTIVE: Today he returns for HF follow up with his father, with whom he lives. Overall feeling fine. No SOB with activity. He stopped all meds about 6 weeks ago, as he did not feel bad, Denies  palpitations, abnormal bleeding, CP, dizziness, edema, or PND/Orthopnea. Appetite ok. Weight at home 153 pounds. He is unemployed, relies on father/siblings for transportation. Smokes 2-3 cigars/day, back drinking ETOH every couple a days (16 oz beer), no drugs. Enjoys writing wraps.  PMH, current medications, allergies, social history, and family history reviewed in epic.  Wt Readings from Last 3 Encounters:  02/23/24 69.8 kg (153 lb 12.8 oz)  01/25/24 72.5 kg (159 lb 12.8 oz)  12/21/23 71.8 kg (158 lb 6.4 oz)   BP 115/85   Pulse 88   Ht 5' 11 (1.803 m)   Wt 69.8 kg (153 lb 12.8 oz)   SpO2 97%   BMI 21.45 kg/m   PHYSICAL EXAM: General:  NAD. No resp difficulty, walked into clinic HEENT: Normal Neck: Supple. No JVD. Cor: Regular rate & rhythm. No rubs, gallops or murmurs. Lungs: Clear Abdomen: Soft, nontender, nondistended.  Extremities: No cyanosis, clubbing, rash, edema Neuro: Alert & oriented x 3, moves all 4 extremities w/o difficulty. Flat affect   DATA REVIEW  ECG: 11/04/2023: Sinus tachy, RAE    ECHO: 09/2023: LVEF less than 20%, severely decreased  function, severely dilated LVIDD, grade 3 diastolic dysfunction, severely reduced RV systolic function  CATH: None  ASSESSMENT & PLAN:  Chronic systolic heart failure: Significant remodeling based on echocardiogram. Do not believe that alcohol is the sole etiology for his cardiomyopathy, given relatively unimpressive consumption.  No significant family history, myocarditis is certainly consideration.  - NYHA I, volume OK today. Off all GDMT. - Restart losartan  12.5 mg daily. - Restart Toprol  XL 12.5 mg daily. - Continue Lasix  20 mg PRN. - Cardiac MRI arranged for 03/03/24; consider further workup, coronary evaluation versus genetic testing based on results - Add back spiro and SGLT2i next visit(s). - Labs today.  Alcohol use: Mild-moderate usage. - Discussed importance of cessation.  Tobacco Use: smokes several black n mild cigars/day. - Discussed cessation  Follow up in 2 weeks with APP for GDMT titration (discuss genetic testing)  Harlene Gainer, FNP-BC Advanced [facility] Failure 02/23/24

## 2024-02-25 ENCOUNTER — Other Ambulatory Visit (HOSPITAL_COMMUNITY): Payer: Self-pay

## 2024-03-02 ENCOUNTER — Encounter (HOSPITAL_COMMUNITY): Payer: Self-pay

## 2024-03-03 ENCOUNTER — Ambulatory Visit (HOSPITAL_COMMUNITY): Admission: RE | Admit: 2024-03-03 | Source: Ambulatory Visit

## 2024-03-09 ENCOUNTER — Telehealth (HOSPITAL_COMMUNITY): Payer: Self-pay

## 2024-03-09 NOTE — Progress Notes (Incomplete)
   ADVANCED HEART FAILURE CLINIC NOTE  Primary Care: Tanda Bleacher, MD HF Cardiologist: Dr. Zenaida  HPI: Bradley Cummings is a 33 y.o. male with a PMH of systolic heart failure who presents for initial visit for further evaluation and treatment of heart failure/cardiomyopathy.      Presented with CHF and severe hyponatremia May 2025. Echocardiogram May 2025 showed ejection fraction less than 20%, significant dilation. He was reportedly drinking a significant amount of alcohol, but after further questioning was drinking a 24oz beer daily, occasionally an additional beer. No liquor or other alcohol. No family history of HF.       Last seen ***. Had been off all of his medications as he had been feeling well. Restarted ***. Did not show-up for cMRI on 03/03/24.  SUBJECTIVE: Here today for ***  He is unemployed, relies on father/siblings for transportation. Smokes 2-3 cigars/day, back drinking ETOH every couple a days (16 oz beer), no drugs. Enjoys Journalist, newspaper.  PMH, current medications, allergies, social history, and family history reviewed in epic.  Wt Readings from Last 3 Encounters:  02/23/24 69.8 kg (153 lb 12.8 oz)  01/25/24 72.5 kg (159 lb 12.8 oz)  12/21/23 71.8 kg (158 lb 6.4 oz)   There were no vitals taken for this visit.  PHYSICAL EXAM: General:  NAD. No resp difficulty, walked into clinic HEENT: Normal Neck: Supple. No JVD. Cor: Regular rate & rhythm. No rubs, gallops or murmurs. Lungs: Clear Abdomen: Soft, nontender, nondistended.  Extremities: No cyanosis, clubbing, rash, edema Neuro: Alert & oriented x 3, moves all 4 extremities w/o difficulty. Flat affect   DATA REVIEW  ECG: 11/04/2023: Sinus tachy, RAE    ECHO: 09/2023: LVEF less than 20%, severely decreased function, severely dilated LVIDD, grade 3 diastolic dysfunction, severely reduced RV systolic function  CATH: None  ASSESSMENT & PLAN:  Chronic systolic heart failure: Significant remodeling based on  echocardiogram. Do not believe that alcohol is the sole etiology for his cardiomyopathy, given relatively unimpressive consumption.  No significant family history, myocarditis is certainly consideration.  - NYHA I, volume OK today. Off all GDMT. - Restart losartan  12.5 mg daily. - Restart Toprol  XL 12.5 mg daily. - Continue Lasix  20 mg PRN. - Cardiac MRI arranged for 03/03/24; consider further workup, coronary evaluation versus genetic testing based on results - Add back spiro and SGLT2i next visit(s). - Labs today.  Alcohol use: Mild-moderate usage. - Discussed importance of cessation.  Tobacco Use: smokes several black n mild cigars/day. - Discussed cessation  Follow up in 2 weeks with APP for GDMT titration (discuss genetic testing)  Bradley Cummings Advanced [facility] 03/09/24

## 2024-03-09 NOTE — Telephone Encounter (Signed)
 Called called and spoke to pt's father to confirm/remind patient of their appointment at the [facility] on 03/10/24.   Appointment:   [x] Confirmed  [] Left mess   [] No answer/No voice mail  [] VM Full/unable to leave message  [] Phone not in service  Patient reminded to bring all medications and/or complete list.  Confirmed patient has transportation. Gave directions, instructed to utilize valet parking.

## 2024-03-10 ENCOUNTER — Encounter (HOSPITAL_COMMUNITY)

## 2024-03-30 ENCOUNTER — Encounter (HOSPITAL_COMMUNITY): Payer: Self-pay | Admitting: Cardiology

## 2024-04-18 ENCOUNTER — Encounter (HOSPITAL_COMMUNITY): Payer: Self-pay

## 2024-04-19 ENCOUNTER — Other Ambulatory Visit (HOSPITAL_COMMUNITY)

## 2024-04-21 ENCOUNTER — Ambulatory Visit (HOSPITAL_COMMUNITY)
Admission: RE | Admit: 2024-04-21 | Discharge: 2024-04-21 | Disposition: A | Discharge status: Home / Self Care | Source: Ambulatory Visit | Attending: Cardiology | Admitting: Cardiology

## 2024-04-21 ENCOUNTER — Other Ambulatory Visit (HOSPITAL_COMMUNITY): Payer: Self-pay | Admitting: Cardiology

## 2024-04-21 ENCOUNTER — Encounter (HOSPITAL_COMMUNITY): Payer: Self-pay | Admitting: Cardiology

## 2024-04-21 DIAGNOSIS — I5020 Unspecified systolic (congestive) heart failure: Secondary | ICD-10-CM

## 2024-04-21 MED ORDER — GADOBUTROL 1 MMOL/ML IV SOLN
10.0000 mL | Freq: Once | INTRAVENOUS | Status: AC | PRN
  Administered 2024-04-21: 10 mL via INTRAVENOUS

## 2024-06-08 ENCOUNTER — Ambulatory Visit (HOSPITAL_COMMUNITY)
Admission: RE | Admit: 2024-06-08 | Discharge: 2024-06-08 | Disposition: A | Discharge status: Home / Self Care | Source: Ambulatory Visit | Attending: Adult Health | Admitting: Adult Health

## 2024-06-08 ENCOUNTER — Other Ambulatory Visit (HOSPITAL_COMMUNITY): Payer: Self-pay

## 2024-06-08 ENCOUNTER — Encounter (HOSPITAL_COMMUNITY): Payer: Self-pay | Admitting: Adult Health

## 2024-06-08 VITALS — BP 114/70 | HR 97 | Wt 163.6 lb

## 2024-06-08 DIAGNOSIS — Z716 Tobacco abuse counseling: Secondary | ICD-10-CM | POA: Diagnosis not present

## 2024-06-08 DIAGNOSIS — Z7984 Long term (current) use of oral hypoglycemic drugs: Secondary | ICD-10-CM | POA: Insufficient documentation

## 2024-06-08 DIAGNOSIS — I429 Cardiomyopathy, unspecified: Secondary | ICD-10-CM | POA: Insufficient documentation

## 2024-06-08 DIAGNOSIS — I5022 Chronic systolic (congestive) heart failure: Secondary | ICD-10-CM | POA: Insufficient documentation

## 2024-06-08 DIAGNOSIS — E8770 Fluid overload, unspecified: Secondary | ICD-10-CM | POA: Insufficient documentation

## 2024-06-08 DIAGNOSIS — Z79899 Other long term (current) drug therapy: Secondary | ICD-10-CM | POA: Insufficient documentation

## 2024-06-08 DIAGNOSIS — F1090 Alcohol use, unspecified, uncomplicated: Secondary | ICD-10-CM | POA: Diagnosis not present

## 2024-06-08 DIAGNOSIS — F1020 Alcohol dependence, uncomplicated: Secondary | ICD-10-CM

## 2024-06-08 DIAGNOSIS — Z720 Tobacco use: Secondary | ICD-10-CM | POA: Diagnosis not present

## 2024-06-08 DIAGNOSIS — F17290 Nicotine dependence, other tobacco product, uncomplicated: Secondary | ICD-10-CM | POA: Insufficient documentation

## 2024-06-08 LAB — BASIC METABOLIC PANEL WITH GFR
Anion gap: 11 (ref 5–15)
BUN: 6 mg/dL (ref 6–20)
CO2: 24 mmol/L (ref 22–32)
Calcium: 8.8 mg/dL — ABNORMAL LOW (ref 8.9–10.3)
Chloride: 104 mmol/L (ref 98–111)
Creatinine, Ser: 0.88 mg/dL (ref 0.61–1.24)
GFR, Estimated: >60 mL/min
Glucose, Bld: 71 mg/dL (ref 70–99)
Potassium: 4.2 mmol/L (ref 3.5–5.1)
Sodium: 139 mmol/L (ref 135–145)

## 2024-06-08 LAB — PRO BRAIN NATRIURETIC PEPTIDE: Pro Brain Natriuretic Peptide: 1660 pg/mL — ABNORMAL HIGH

## 2024-06-08 MED ORDER — FUROSEMIDE 20 MG PO TABS
20.0000 mg | ORAL_TABLET | Freq: Every day | ORAL | 4 refills | Status: DC
  Filled 2024-06-08: qty 30, 30d supply, fill #0

## 2024-06-08 MED ORDER — SPIRONOLACTONE 25 MG PO TABS
25.0000 mg | ORAL_TABLET | Freq: Every day | ORAL | 3 refills | Status: AC
  Filled 2024-06-08: qty 30, 30d supply, fill #0

## 2024-06-08 NOTE — Progress Notes (Signed)
"   ReDS Vest / Clip - 06/08/24 1435       ReDS Vest / Clip   Station Marker C    Ruler Value 29    ReDS Value Range Moderate volume overload    ReDS Actual Value 40          "

## 2024-06-08 NOTE — Patient Instructions (Signed)
 Medication Changes:  START FUROSEMIDE  20MG  ONCE DAILY   START SPIRONOLACTONE  25MG  ONCE DAILY   Lab Work:  Labs done today, your results will be available in MyChart, we will contact you for abnormal readings.  Follow-Up in: NEXT WEEK AS SCHEDULED   At the [facility], you and your health needs are our priority. We have a designated team specialized in the treatment of Heart Failure. This Care Team includes your primary Heart Failure Specialized Cardiologist (physician), Advanced Practice Providers (APPs- Physician Assistants and Nurse Practitioners), and Pharmacist who all work together to provide you with the care you need, when you need it.   You may see any of the following providers on your designated Care Team at your next follow up:  Dr. Toribio Fuel Dr. Ezra Shuck Dr. Odis Brownie Greig Mosses, NP Caffie Shed, [place] [facility], [place] Beckey Coe, NP Jordan Lee, NP Tinnie Redman, PharmD   Please be sure to bring in all your medications bottles to every appointment.   Need to Contact Us :  If you have any questions or concerns before your next appointment please send us  a message through [place] or call our office at [phone].    TO LEAVE A MESSAGE FOR THE NURSE SELECT OPTION 2, PLEASE LEAVE A MESSAGE INCLUDING: YOUR NAME DATE OF BIRTH CALL BACK NUMBER REASON FOR CALL**this is important as we prioritize the call backs  YOU WILL RECEIVE A CALL BACK THE SAME DAY AS LONG AS YOU CALL BEFORE 4:00 PM

## 2024-06-08 NOTE — Progress Notes (Signed)
" ° °  ADVANCED HEART FAILURE CLINIC NOTE  Primary Care: Tanda Bleacher, MD HF Cardiologist: Dr. Zenaida  HPI: Bradley Cummings is a 34 y.o. male with a PMH of systolic heart failure who presents today for heart failure follow up.    Presented with CHF and severe hyponatremia May 2025. Echocardiogram May 2025 showed ejection fraction less than 20%, significant dilation. He was reportedly drinking a significant amount of alcohol, but after further questioning was drinking a 24oz beer daily, occasionally an additional beer. No liquor or other alcohol. No family history of HF.    SUBJECTIVE: Today he returns for HF follow up. Says he could walk a mile but needs to take breaks. Having difficulty sleeping.  + PND 2-3 times per week. Denies Orthopnea. Appetite ok. No fever or chills. He is not weighing.  Taking all medications. He is only taking losartan  and jardiance . Currently not working. Smokes black and milds 2 per day. Drinking 6 beers per week. Lives with his Dad.   PMH, current medications, allergies, social history, and family history reviewed in epic.  Wt Readings from Last 3 Encounters:  06/08/24 74.2 kg (163 lb 9.6 oz)  02/23/24 69.8 kg (153 lb 12.8 oz)  01/25/24 72.5 kg (159 lb 12.8 oz)   BP 114/70   Pulse 97   Wt 74.2 kg (163 lb 9.6 oz)   SpO2 99%   BMI 22.82 kg/m   PHYSICAL EXAM: General:   No resp difficulty Neck:JVP 10-11 Cor: Regular rate & rhythm.  Lungs: clear Abdomen: soft, nontender, nondistended.  Extremities: no  edema Neuro: alert & oriented x3  DATA REVIEW CM[facility] 04/21/24 Cardiac MRI LVEF 22% Apical Trabeculations RV 33% Mod-Severe MR.  ECG: 06/08/2024 : SR 97 bpm QRS 102 ms  11/04/2023: Sinus tachy, RAE    ECHO: 09/2023: LVEF less than 20%, severely decreased function, severely dilated LVIDD, grade 3 diastolic dysfunction, severely reduced RV systolic function  CATH: None  ASSESSMENT & PLAN:  Chronic systolic heart failure: Significant remodeling based on  echocardiogram. Do not believe that alcohol is the sole etiology for his cardiomyopathy, given relatively unimpressive consumption.  No significant family history, myocarditis is certainly consideration.  -Cardiac MRI LVEF 22% Apical Trabeculations RV 33% Mod-Severe MR. Discussed MRI today. Consider genetic testing.  - NYHA III.ReDs reading: 40 %, abnormal On exam he is volume overloaded.  He is reluctant to take/add medications but after discussing he agreed to take lasix  and spiro.  We need to try and optimize GDMT and repeat ECHO to assess for improvement.  - Start lasix  20 mg daily -Start spiro 25 mg daily.  -Restart spiro 25 mg daily - Continue losartan  12.5 mg daily. - Continue jardiance  10 mg daily  -Check BMET and pro BNP  - Concerned he may need advanced therapies down the road if EF remains severely reduced. No currently a candidate for transplant due to nicotine  use. Needs close follow up.   Alcohol use: Mild-moderate usage. - Discussed cessation.   Tobacco Use: Continues to smoke black n mild cigars/day. -Discussed cessation.   Follow up next week to reassess volume status   Alece Koppel NP-C  2:32 PM   Advanced Heart Failure 06/08/2024 "

## 2024-06-09 ENCOUNTER — Ambulatory Visit (HOSPITAL_COMMUNITY): Payer: Self-pay | Admitting: Adult Health

## 2024-06-12 ENCOUNTER — Encounter: Payer: Self-pay | Admitting: Family Medicine

## 2024-06-12 ENCOUNTER — Ambulatory Visit: Admitting: Family Medicine

## 2024-06-12 VITALS — BP 113/79 | HR 93 | Ht 71.0 in | Wt 158.0 lb

## 2024-06-12 DIAGNOSIS — I10 Essential (primary) hypertension: Secondary | ICD-10-CM

## 2024-06-12 DIAGNOSIS — F331 Major depressive disorder, recurrent, moderate: Secondary | ICD-10-CM

## 2024-06-12 MED ORDER — ESCITALOPRAM OXALATE 5 MG PO TABS: 5.0000 mg | ORAL_TABLET | Freq: Every day | ORAL | 1 refills | Status: AC

## 2024-06-13 ENCOUNTER — Encounter: Payer: Self-pay | Admitting: Family Medicine

## 2024-06-13 NOTE — Progress Notes (Signed)
 "  Established Patient Office Visit  Subjective    Patient ID: Bradley Cummings, male    DOB: 03-26-1991  Age: 34 y.o. MRN: [id number]  CC:  Chief Complaint  Patient presents with   Medical Management of Chronic Issues    Pt here for anxiety and depression medication refill. I see no med of this category in his current med list and pt is unsure of name of medication.     HPI Bradley Cummings presents for follow up of anxiety/depression with med refills. Patient reports med compliance and denies acute complaints.   Outpatient Encounter Medications as of 06/12/2024  Medication Sig   empagliflozin  (JARDIANCE ) 10 MG TABS tablet Take 1 tablet (10 mg total) by mouth daily before breakfast.   escitalopram  (LEXAPRO ) 5 MG tablet Take 1 tablet (5 mg total) by mouth daily.   furosemide  (LASIX ) 20 MG tablet Take 1 tablet (20 mg total) by mouth daily.   losartan  (COZAAR ) 25 MG tablet Take 0.5 tablets (12.5 mg total) by mouth daily.   spironolactone  (ALDACTONE ) 25 MG tablet Take 1 tablet (25 mg total) by mouth daily.   artificial tears (LACRILUBE) OINT ophthalmic ointment Place into both eyes every 4 (four) hours as needed for dry eyes.   No facility-administered encounter medications on file as of 06/12/2024.    Past Medical History:  Diagnosis Date   Acute alcoholic hepatitis (HCC) 10/15/2023   Alcohol withdrawal (HCC)    Eczema    ETOH abuse    HFrEF (heart failure with reduced ejection fraction) (HCC)    Hyponatremia with excess extracellular fluid volume 10/15/2023   MDD (major depressive disorder)    Severe alcohol dependence (HCC)    Tobacco abuse    Traumatic subdural hematoma (SDH) (HCC) 2019   Fall    History reviewed. No pertinent surgical history.  Family History  Problem Relation Age of Onset   Mental illness Neg Hx     Social History   Socioeconomic History   Marital status: Single    Spouse name: Not on file   Number of children: Not on file   Years of education: Not on  file   Highest education level: Not on file  Occupational History   Not on file  Tobacco Use   Smoking status: Every Day    Current packs/day: 0.25    Average packs/day: 0.3 packs/day for 3.0 years (0.8 ttl pk-yrs)    Types: Cigarettes   Smokeless tobacco: Never  Vaping Use   Vaping status: Some Days  Substance and Sexual Activity   Alcohol use: Not Currently    Comment: 3-4 40oz beers a day   Drug use: Not Currently    Types: Marijuana   Sexual activity: Never  Other Topics Concern   Not on file  Social History Narrative   Not on file   Social Drivers of Health   Tobacco Use: High Risk (06/12/2024)   Patient History    Smoking Tobacco Use: Every Day    Smokeless Tobacco Use: Never    Passive Exposure: Not on file  Financial Resource Strain: Not on file  Food Insecurity: No Food Insecurity (06/12/2024)   Epic    Worried About Programme Researcher, Broadcasting/film/video in the Last Year: Never true    Ran Out of Food in the Last Year: Never true  Transportation Needs: No Transportation Needs (06/12/2024)   Epic    Lack of Transportation (Medical): No    Lack of Transportation (Non-Medical): No  Physical Activity:  Not on file  Stress: Not on file  Social Connections: Moderately Isolated (10/15/2023)   Social Connection and Isolation Panel    Frequency of Communication with Friends and Family: Twice a week    Frequency of Social Gatherings with Friends and Family: Twice a week    Attends Religious Services: Never    Database Administrator or Organizations: No    Attends Engineer, Structural: 1 to 4 times per year    Marital Status: Widowed  Intimate Partner Violence: Not At Risk (06/12/2024)   Epic    Fear of Current or Ex-Partner: No    Emotionally Abused: No    Physically Abused: No    Sexually Abused: No  Depression (PHQ2-9): High Risk (06/12/2024)   Depression (PHQ2-9)    PHQ-2 Score: 12  Alcohol Screen: Not on file  Housing: Low Risk (06/12/2024)   Epic    Unable to Pay for  Housing in the Last Year: No    Number of Times Moved in the Last Year: 0    Homeless in the Last Year: No  Utilities: Not At Risk (06/12/2024)   Epic    Threatened with loss of utilities: No  Health Literacy: Not on file    Review of Systems  All other systems reviewed and are negative.       Objective    BP 113/79   Pulse 93   Ht 5' 11 (1.803 m)   Wt 158 lb (71.7 kg)   SpO2 99%   BMI 22.04 kg/m   Physical Exam Vitals and nursing note reviewed.  Constitutional:      General: He is not in acute distress. Cardiovascular:     Rate and Rhythm: Normal rate and regular rhythm.  Pulmonary:     Effort: Pulmonary effort is normal.     Breath sounds: Normal breath sounds.  Abdominal:     Palpations: Abdomen is soft.     Tenderness: There is no abdominal tenderness.  Neurological:     General: No focal deficit present.     Mental Status: He is alert and oriented to person, place, and time.  Psychiatric:        Mood and Affect: Mood normal. Affect is flat.         Assessment & Plan:  1. Major depressive disorder, recurrent episode, moderate (HCC) (Primary) Patient believes present dosage is sufficient. Will refill meds. Continue with BH  2. Essential hypertension Appears stable. Continue as per consultant   Return in about 6 months (around 12/10/2024) for follow up.   Tanda Raguel SQUIBB, MD  "

## 2024-06-15 ENCOUNTER — Telehealth (HOSPITAL_COMMUNITY): Payer: Self-pay | Admitting: Cardiology

## 2024-06-15 NOTE — Telephone Encounter (Signed)
 Called to confirm/remind patient of their appointment at the [facility] on 06/15/24.   Appointment:   [x] Confirmed  [] Left mess   [] No answer/No voice mail  [] VM Full/unable to leave message  [] Phone not in service  Patient reminded to bring all medications and/or complete list.  Confirmed patient has transportation. Gave directions, instructed to utilize valet parking.

## 2024-06-16 ENCOUNTER — Ambulatory Visit (HOSPITAL_COMMUNITY)
Admission: RE | Admit: 2024-06-16 | Discharge: 2024-06-16 | Disposition: A | Discharge status: Home / Self Care | Source: Ambulatory Visit | Attending: Cardiology | Admitting: Cardiology

## 2024-06-16 ENCOUNTER — Encounter (HOSPITAL_COMMUNITY): Payer: Self-pay | Admitting: Cardiology

## 2024-06-16 VITALS — BP 106/62 | HR 88 | Ht 71.0 in | Wt 161.0 lb

## 2024-06-16 DIAGNOSIS — Z7984 Long term (current) use of oral hypoglycemic drugs: Secondary | ICD-10-CM | POA: Diagnosis not present

## 2024-06-16 DIAGNOSIS — I5022 Chronic systolic (congestive) heart failure: Secondary | ICD-10-CM | POA: Diagnosis present

## 2024-06-16 DIAGNOSIS — I429 Cardiomyopathy, unspecified: Secondary | ICD-10-CM | POA: Diagnosis not present

## 2024-06-16 DIAGNOSIS — Z79899 Other long term (current) drug therapy: Secondary | ICD-10-CM | POA: Insufficient documentation

## 2024-06-16 MED ORDER — FUROSEMIDE 20 MG PO TABS: 20.0000 mg | ORAL_TABLET | ORAL | 4 refills | Status: AC

## 2024-06-16 NOTE — Progress Notes (Signed)
" ° °  ADVANCED HEART FAILURE NEW PATIENT CLINIC NOTE  Referring Physician: Tanda Bleacher, MD  Primary Care: Tanda Bleacher, MD Primary Cardiologist:  HPI: Bradley Cummings is a 34 y.o. male with a PMH of systolic heart failure who presents for initial visit for further evaluation and treatment of heart failure/cardiomyopathy.      Presented with CHF and severe hyponatremia May 2025. Echocardiogram May 2025 showed ejection fraction less than 20%, significant dilation. He was reportedly drinking a significant amount of alcohol, but after further questioning was drinking a 24oz beer daily, occasionally an additional beer. No liquor or other alcohol. No family history of HF.      SUBJECTIVE:  Discussed recent MRI, showing severe cardiomyopathy with severely dilated LV.  Briefly discussed advanced therapies, patient would not receive blood products for any reason given religious beliefs, though he does not appear to be a Jehovah's Witness his father seems to understand the gravity of the situation, though the patient is extremely flat and difficult to understand if he realizes the severity of his disease.  PMH, current medications, allergies, social history, and family history reviewed in epic.  PHYSICAL EXAM: Vitals:   06/16/24 1341  BP: 106/62  Pulse: 88  SpO2: 100%    GENERAL: NAD, fair appearing PULM:  Normal work of breathing, CTAB CARDIAC:  JVP: flat         Normal rate with regular rhythm. No murmurs, rubs or gallops.  No edema. Warm and well perfused extremities. ABDOMEN: Soft, non-tender, non-distended. NEUROLOGIC: Patient is oriented x3 with no focal or lateralizing neurologic deficits.     DATA REVIEW  ECG: 11/04/2023: Sinus tachy, RAE    ECHO: 09/2023: LVEF less than 20%, severely decreased function, severely dilated LVIDD, grade 3 diastolic dysfunction, severely reduced RV systolic function  CATH: None  CMR: Severely dilated LV with severely reduced LV systolic  function, LVEF 22%.  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  Etiology unclear, no significant family history.  While young, does not accept blood products and does not appear to fully understand the severity of his disease. -Cardiopulmonary exercise testing to assess need for advanced therapies -We have discussed ICD, further discussion at next visit - Defer coronary angiogram at this time given lack of coronary LGE, young age - Continue losartan  12.5 mg daily, spironolactone  25 mg daily, Jardiance  10 mg daily -Lasix  decreased to 20 mg every other day  Alcohol use: Mild-moderate usage, in remission. - Congratulated   Follow up in 1-2 months  Morene Brownie, MD Advanced Heart Failure Mechanical Circulatory Support 06/18/24 "

## 2024-06-16 NOTE — Patient Instructions (Addendum)
 CHANGE Lasix  to every other day.  You are scheduled for a Cardiopulmonary Exercise (CPX) Test as [facility] on: Date:  06/27/24    Time: 1.30 pm   Expect to be in the lab for 2 hours. Please plan to arrive 30 minutes prior to your appointment. You may be asked to reschedule your test if you arrive 20 minutes or more after your scheduled appointment time.  Main Campus address: [address] [place], [place] [address] You may arrive to the Main Entrance A or Entrance C (free valet parking is available at both). -Main Entrance A (on [address]) :proceed to admitting for check in -Entrance C (on [employer]): proceed to [facility] [employer] parking or under hospital deck parking using this code _____2026____  Check In: [facility] waiting room (1st floor)   General Instructions for the day of the test (Please follow all instructions from your physician): Refrain from ingesting a heavy meal, alcohol, or caffeine or using tobacco products within 2 hours of the test (DO NOT FAST for mare than 8 hours). You may have all other non-alcoholic, non -caffeinated beverage,a light snack (crackers,a piece of fruit, carrot sticks, toast bagel,etc) up to your appointment. Avoid significant exertion or exercise within 24 hours of your test. Be prepared to exercise and sweat. Your clothing should permit freedom of movement and include walking or running shoes. Women bring loose fitting short sleeved blouse.  This evaluation may be fatiguing and you may wish ti have someone accompany you to the assessment to drive you home afterward. Bring a list of your medications with you, including dosage and frequency you take the medications (  I.e.,once per day, twice per day, etc). Take all medications as prescribed, unless noted below or instructed to do so by your physician.  Please do not take the following medications prior to your  CPX:  _________________________________________________  _________________________________________________  Brief description of the test: A brief lung test will be performed. This will involve you taking deep breaths and blowing hard and fast through your mouth. During these , a clip will be on your nose and you will be breathing through a breathing device.   For the exercise portion of the test you will be walking on a treadmill, or riding a stationary bike, to your maximal effor or until symptoms such as chest pain, shortness of breath, leg pain or dizziness limit your exercise. You will be breathing in and out of a breathing device through your mouth (a clip will be on your nose again). Your heart rate, ECG, blood pressure, oxygen saturations, breathing rate and depth, amount of oxygen you consume and amount of carbon dioxide you produce will be measured and monitored throughout the exercise test.  If you need to cancel or reschedule your appointment please call [phone] If you have further questions please call your physician or Damien Nunnery at [phone]  Your physician recommends that you schedule a follow-up appointment in: as scheduled.  If you have any questions or concerns before your next appointment please send us  a message through [place] or call our office at [phone].    TO LEAVE A MESSAGE FOR THE NURSE SELECT OPTION 2, PLEASE LEAVE A MESSAGE INCLUDING: YOUR NAME DATE OF BIRTH CALL BACK NUMBER REASON FOR CALL**this is important as we prioritize the call backs  YOU WILL RECEIVE A CALL BACK THE SAME DAY AS LONG AS YOU CALL BEFORE 4:00 PM  At the [facility], you and your  health needs are our priority. As part of our continuing mission to provide you with exceptional heart care, we have created designated Provider Care Teams. These Care Teams include your primary Cardiologist (physician) and Advanced Practice Providers (APPs- Physician Assistants and  Nurse Practitioners) who all work together to provide you with the care you need, when you need it.   You may see any of the following providers on your designated Care Team at your next follow up: Dr Toribio Fuel Dr Ezra Shuck Dr. Morene Brownie Greig Mosses, NP Caffie Shed, [place] [facility] [place], [place] Beckey Coe, NP Jordan Lee, NP Ellouise Class, NP Tinnie Redman, PharmD Jaun Bash, PharmD   Please be sure to bring in all your medications bottles to every appointment.    Thank you for choosing  [facility]

## 2024-06-22 ENCOUNTER — Telehealth: Payer: Self-pay | Admitting: Internal Medicine

## 2024-06-22 ENCOUNTER — Ambulatory Visit: Admitting: Family Medicine

## 2024-06-27 ENCOUNTER — Encounter (HOSPITAL_COMMUNITY)

## 2024-07-04 ENCOUNTER — Ambulatory Visit (HOSPITAL_COMMUNITY): Attending: Cardiology

## 2024-07-04 DIAGNOSIS — I5022 Chronic systolic (congestive) heart failure: Secondary | ICD-10-CM | POA: Insufficient documentation

## 2024-07-25 ENCOUNTER — Ambulatory Visit (HOSPITAL_COMMUNITY): Admitting: Cardiology

## 2024-08-04 ENCOUNTER — Ambulatory Visit: Admitting: Family Medicine
